# Patient Record
Sex: Male | Born: 1940 | Race: White | Hispanic: No | Marital: Married | Smoking: Never smoker
Health system: Southern US, Community
[De-identification: ages and names within clinical notes are randomized; demographics above are authoritative.]

## PROBLEM LIST (undated history)

## (undated) ENCOUNTER — Emergency Department (HOSPITAL_COMMUNITY): Payer: Medicare HMO

## (undated) DIAGNOSIS — I2510 Atherosclerotic heart disease of native coronary artery without angina pectoris: Secondary | ICD-10-CM

## (undated) DIAGNOSIS — E119 Type 2 diabetes mellitus without complications: Secondary | ICD-10-CM

## (undated) DIAGNOSIS — I480 Paroxysmal atrial fibrillation: Secondary | ICD-10-CM

## (undated) DIAGNOSIS — I350 Nonrheumatic aortic (valve) stenosis: Secondary | ICD-10-CM

## (undated) DIAGNOSIS — I10 Essential (primary) hypertension: Secondary | ICD-10-CM

---

## 1999-09-03 ENCOUNTER — Encounter: Payer: Self-pay | Admitting: Urology

## 1999-09-06 ENCOUNTER — Ambulatory Visit (HOSPITAL_COMMUNITY): Admission: RE | Admit: 1999-09-06 | Discharge: 1999-09-06 | Payer: Self-pay | Admitting: Urology

## 2005-07-04 ENCOUNTER — Ambulatory Visit: Payer: Self-pay | Admitting: Internal Medicine

## 2005-07-23 ENCOUNTER — Ambulatory Visit: Payer: Self-pay | Admitting: Internal Medicine

## 2005-08-22 ENCOUNTER — Ambulatory Visit: Payer: Self-pay | Admitting: Internal Medicine

## 2005-09-22 ENCOUNTER — Ambulatory Visit: Payer: Self-pay | Admitting: Internal Medicine

## 2005-10-23 ENCOUNTER — Ambulatory Visit: Payer: Self-pay | Admitting: Internal Medicine

## 2005-11-20 ENCOUNTER — Ambulatory Visit: Payer: Self-pay | Admitting: Internal Medicine

## 2005-12-21 ENCOUNTER — Ambulatory Visit: Payer: Self-pay | Admitting: Internal Medicine

## 2009-03-08 ENCOUNTER — Ambulatory Visit: Payer: Self-pay | Admitting: Unknown Physician Specialty

## 2009-03-22 ENCOUNTER — Ambulatory Visit: Payer: Self-pay | Admitting: Unknown Physician Specialty

## 2009-04-22 ENCOUNTER — Ambulatory Visit: Payer: Self-pay | Admitting: Unknown Physician Specialty

## 2010-11-06 ENCOUNTER — Ambulatory Visit: Payer: Self-pay | Admitting: Cardiology

## 2010-11-21 ENCOUNTER — Encounter: Payer: Self-pay | Admitting: Cardiology

## 2010-12-22 ENCOUNTER — Encounter: Payer: Self-pay | Admitting: Cardiology

## 2011-01-21 ENCOUNTER — Encounter: Payer: Self-pay | Admitting: Cardiology

## 2012-01-29 ENCOUNTER — Observation Stay: Payer: Self-pay | Admitting: Internal Medicine

## 2012-01-29 LAB — URINALYSIS, COMPLETE
Bacteria: NONE SEEN
Bilirubin,UR: NEGATIVE
Blood: NEGATIVE
Glucose,UR: 50 mg/dL (ref 0–75)
Ketone: NEGATIVE
Leukocyte Esterase: NEGATIVE
Nitrite: NEGATIVE
Ph: 6 (ref 4.5–8.0)
Protein: NEGATIVE
RBC,UR: NONE SEEN /HPF (ref 0–5)
Specific Gravity: 1.01 (ref 1.003–1.030)
Squamous Epithelial: NONE SEEN
WBC UR: NONE SEEN /HPF (ref 0–5)

## 2012-01-29 LAB — BASIC METABOLIC PANEL
Anion Gap: 10 (ref 7–16)
BUN: 13 mg/dL (ref 7–18)
Calcium, Total: 9.2 mg/dL (ref 8.5–10.1)
Chloride: 104 mmol/L (ref 98–107)
Co2: 25 mmol/L (ref 21–32)
Creatinine: 0.81 mg/dL (ref 0.60–1.30)
EGFR (African American): >60
EGFR (Non-African Amer.): >60
Glucose: 209 mg/dL — ABNORMAL HIGH (ref 65–99)
Osmolality: 284 (ref 275–301)
Potassium: 4.7 mmol/L (ref 3.5–5.1)
Sodium: 139 mmol/L (ref 136–145)

## 2012-01-29 LAB — CBC
HCT: 46 % (ref 40.0–52.0)
HGB: 15.1 g/dL (ref 13.0–18.0)
MCH: 28.6 pg (ref 26.0–34.0)
MCHC: 32.8 g/dL (ref 32.0–36.0)
MCV: 87 fL (ref 80–100)
Platelet: 201 10*3/uL (ref 150–440)
RBC: 5.27 10*6/uL (ref 4.40–5.90)
RDW: 14.5 % (ref 11.5–14.5)
WBC: 15.4 10*3/uL — ABNORMAL HIGH (ref 3.8–10.6)

## 2012-01-29 LAB — CK TOTAL AND CKMB (NOT AT ARMC)
CK, Total: 93 U/L (ref 35–232)
CK-MB: 0.7 ng/mL (ref 0.5–3.6)

## 2012-01-29 LAB — TROPONIN I
Troponin-I: <0.02 ng/mL
Troponin-I: 0.03 ng/mL

## 2012-01-29 LAB — WBC: WBC: 10.8 10*3/uL — ABNORMAL HIGH (ref 3.8–10.6)

## 2012-01-29 IMAGING — CR DG CHEST 1V PORT
1 series · 1 of 1 positions shown · non-contrast
Comparison: none

REASON FOR EXAM: CP
COMMENTS:

[portable]
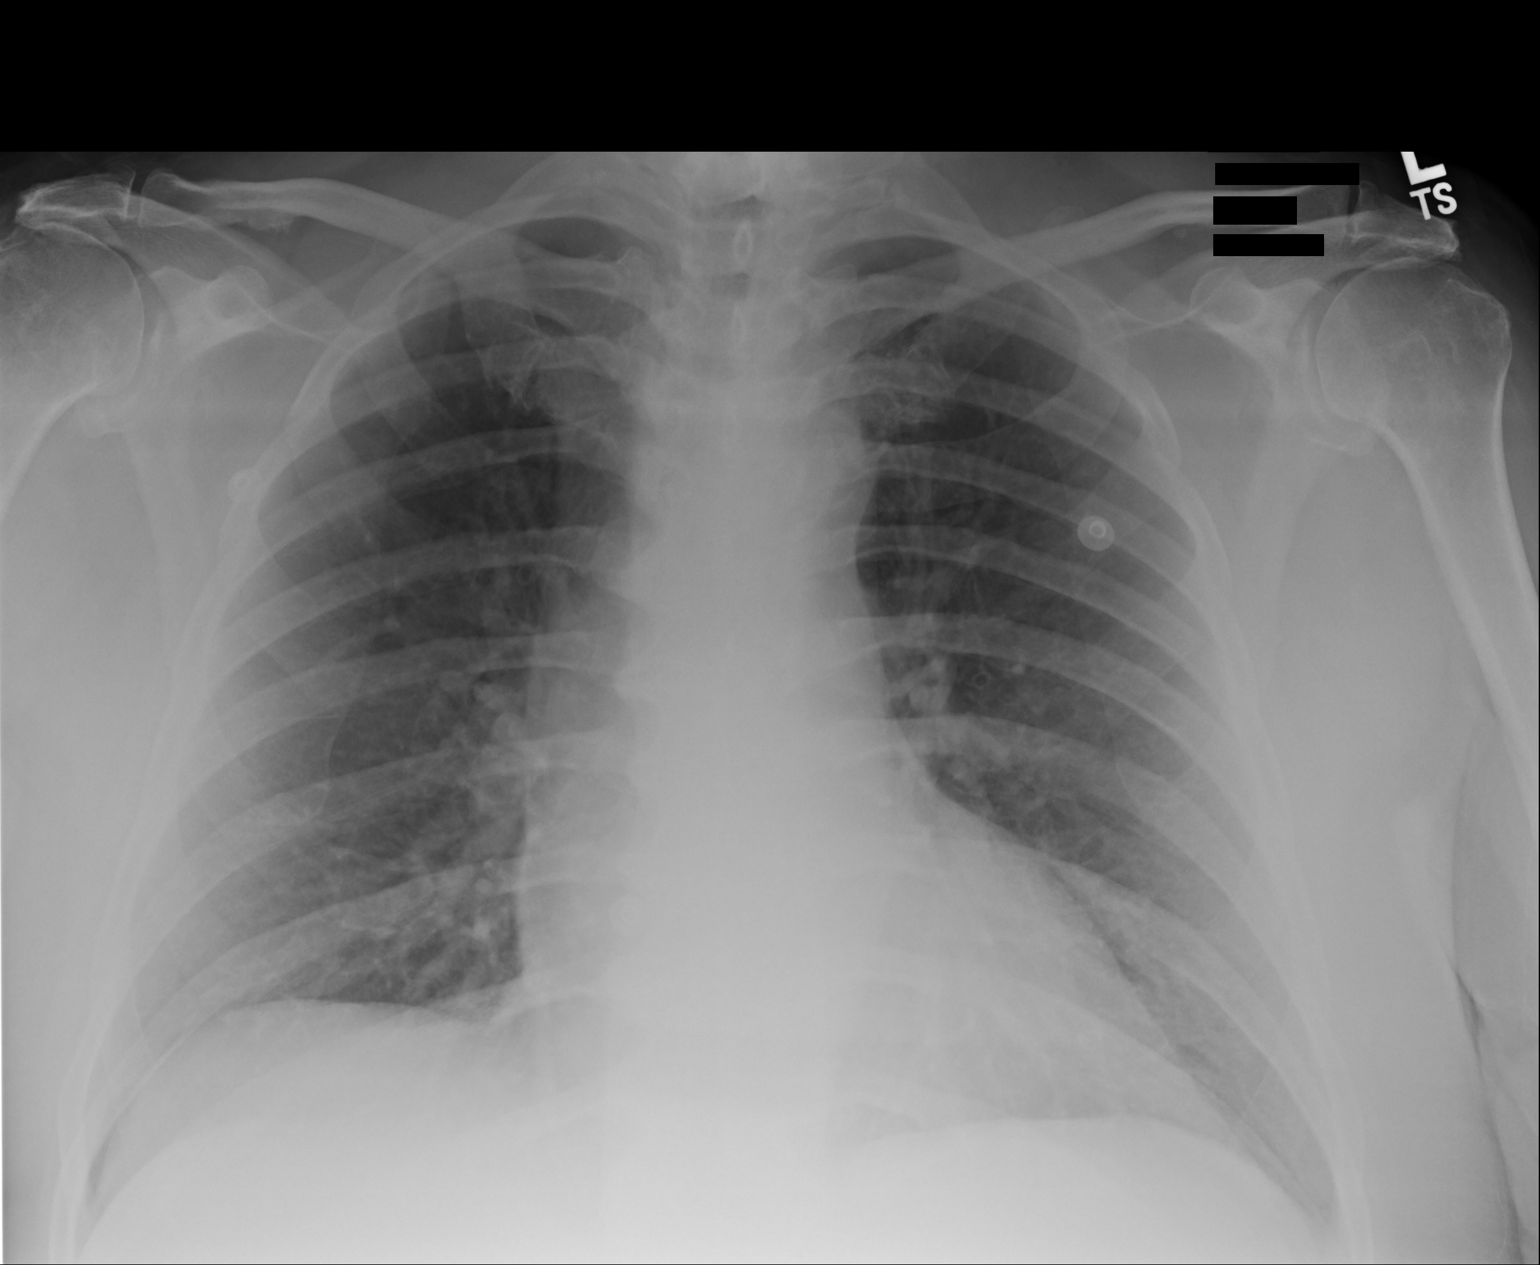

[1 of 1 positions shown; findings below may reference images not displayed]

PROCEDURE:     DXR - DXR PORTABLE CHEST SINGLE VIEW  - [DATE]  [DATE]

RESULT:     There is no previous exam for comparison.

The lungs are clear. The heart and pulmonary vessels are normal. The bony
and mediastinal structures are unremarkable. There is no effusion. There is
no pneumothorax or evidence of congestive failure.
IMPRESSION: No acute cardiopulmonary disease.

[REDACTED]

## 2012-05-01 DIAGNOSIS — Z961 Presence of intraocular lens: Secondary | ICD-10-CM | POA: Insufficient documentation

## 2012-05-01 DIAGNOSIS — E119 Type 2 diabetes mellitus without complications: Secondary | ICD-10-CM | POA: Insufficient documentation

## 2014-02-08 ENCOUNTER — Other Ambulatory Visit: Payer: Self-pay | Admitting: Gastroenterology

## 2014-03-06 DIAGNOSIS — Z800 Family history of malignant neoplasm of digestive organs: Secondary | ICD-10-CM | POA: Insufficient documentation

## 2014-04-14 ENCOUNTER — Ambulatory Visit: Payer: Self-pay | Admitting: Gastroenterology

## 2014-04-17 LAB — PATHOLOGY REPORT

## 2014-04-24 DIAGNOSIS — E669 Obesity, unspecified: Secondary | ICD-10-CM | POA: Insufficient documentation

## 2014-04-24 DIAGNOSIS — E538 Deficiency of other specified B group vitamins: Secondary | ICD-10-CM | POA: Insufficient documentation

## 2014-04-24 DIAGNOSIS — E781 Pure hyperglyceridemia: Secondary | ICD-10-CM | POA: Insufficient documentation

## 2014-06-13 DIAGNOSIS — N529 Male erectile dysfunction, unspecified: Secondary | ICD-10-CM | POA: Insufficient documentation

## 2014-06-13 DIAGNOSIS — I10 Essential (primary) hypertension: Secondary | ICD-10-CM | POA: Insufficient documentation

## 2014-06-13 DIAGNOSIS — I2510 Atherosclerotic heart disease of native coronary artery without angina pectoris: Secondary | ICD-10-CM | POA: Insufficient documentation

## 2015-01-03 ENCOUNTER — Ambulatory Visit: Admit: 2015-01-03 | Disposition: A | Discharge status: Home / Self Care | Payer: Self-pay | Admitting: Family Medicine

## 2015-01-14 NOTE — Consult Note (Signed)
    General Aspect This is a 74 year old male with history of diabetes mellitus, CAD, that presented  to the ER during the night with chest pain. he states he went to bed in his usual state of health and woke up with anterior chest pain.  This was not associated with shortness of breath or nausea, sweating.  He took 2 nitroglycerin and by the time he would was seen in the ER, he was pain free.  His cardiac enzymes have been normal.  His chest x-ray appeared normal.  He does have a history of having a cardiac catheterization in February of 2012.  At that time he had disease of the right coronary, including 70% proximal, 99% distal.  He did receive a stent to the distal RCA.  He has not had any pain since.  He had a stent placed.  He has  indigestion very infrequently, but does not believe this was indigestion.  However, it did not feel exactly like his previous pain at time of stent placement. He continues to be pain free. He has a pending results of lexiscanMyoview. he did present with sinus tachycardia at 150 bpm, but now is in normal sinus rhythm.   Physical Exam:   GEN well developed, no acute distress    HEENT pink conjunctivae, hearing intact to voice, good dentition    NECK supple    RESP normal resp effort    CARD Regular rate and rhythm    ABD denies tenderness  normal BS    EXTR negative edema    SKIN normal to palpation, skin turgor good    NEURO cranial nerves intact, motor/sensory function intact    PSYCH alert, A+O to time, place, person   Review of Systems:   Subjective/Chief Complaint Chest pain    General: No Complaints    Skin: No Complaints    ENT: No Complaints    Eyes: No Complaints    Neck: No Complaints    Respiratory: No Complaints    Cardiovascular: Chest pain or discomfort    Gastrointestinal: No Complaints    Genitourinary: No Complaints    Vascular: No Complaints    Musculoskeletal: No Complaints    Neurologic: No Complaints     Hematologic: No Complaints    Endocrine: No Complaints    Psychiatric: No Complaints    Medications/Allergies Reviewed Medications/Allergies reviewed   Blood Glucose:  09-May-13 07:38    POCT Blood Glucose 176    Actos: Muscles aches  Lipitor: Muscles aches  Citalopram: GI Distress  Wellbutrin: Dizzy/Fainting    Impression 74 year old male with known CAD, status post stenting of the distal RCA in February of 2012, remaining on aspirin and Plavix with onset of chest pain during the night, relieved with 2 nitroglycerin sublingually and is now pain free.  Patient also presented with sinus tachycardia, which is resolved and leukocytosis, questionable etiology.  Cardiac enzymes have been negative and results of Myoview are pending    Plan 1.  Continue aspirin and Plavix without change. 2.  Continue beta blocker. 3.  Continue investigation for leukocytosis. 4.  Continue monitoring of diabetes mellitus. 5.  Results of Myoview are pending.  Further  recommendations by Dr. Gwen PoundsKowalski  once results are known.   Electronic Signatures: Rudi Cocoarroll, Donna (NP)  (Signed 09-May-13 11:25)  Authored: General Aspect/Present Illness, History and Physical Exam, Review of System, Labs, Allergies, Impression/Plan   Last Updated: 09-May-13 11:25 by Rudi Cocoarroll, Donna (NP)

## 2015-01-14 NOTE — Discharge Summary (Signed)
PATIENT NAME:  Harry GrovesULLIAM, Harry [name] MR#:  [id number] DATE OF BIRTH:  Jun 27, 1941  DATE OF ADMISSION:  01/29/2012 DATE OF DISCHARGE:  01/29/2012  PRIMARY CARE PHYSICIAN: Alonna BucklerAndrew Lamb, MD   PRIMARY CARDIOLOGIST: Dr. Darrold JunkerParaschos.   REASON FOR ADMISSION: Chest pain.   DISCHARGE DIAGNOSES:  1. Chest pain of unclear etiology with negative cardiac enzymes, negative Myoview. 2. History of coronary artery disease status post drug-eluting stent to RCA February 2012. 3. Leukocytosis, felt to be stress induced.  4. History of type II diabetes mellitus.   CONSULT: Cardiology, Dr. Gwen PoundsKowalski    DISCHARGE DISPOSITION: Home.  DISCHARGE MEDICATIONS:  1. Aspirin 81 mg daily.  2. Plavix 75 mg daily.  3. Toprol-XL 25 mg p.o. daily. 4. Lisinopril 5 mg daily. 5. Vitamin B12 once [name] month.  6. Glimepiride 4 mg b.i.d.  7. Metformin 1000 mg b.i.d.  8. Nitrostat 0.4 mg sublingually q.5 minutes x3 p.r.n. chest pain.  9. Levemir 55 units subcutaneously at bedtime. 10. Tylenol 325 mg 1 to 2 tablets p.o. q.4 to 6 hours p.r.n. pain.   DISCHARGE CONDITION: Improved, stable.   DISCHARGE ACTIVITY: As tolerated.   DISCHARGE DIET: Low sodium, ADA, low fat, low cholesterol.   DISCHARGE INSTRUCTIONS:  1. Take medications as prescribed.  2. Return to the Emergency Department for recurrence of symptoms. 3. Do not take Byetta until further advised to do so by your endocrinologist (Dr. Renae FicklePaul) as there is [name] concern that Byetta might be related to thyroid cancer and we have adjusted/increased your Levemir dose to compensate since you are not being discharged on Byetta.   FOLLOW-UP INSTRUCTIONS: Follow-up with Dr. Randa LynnLamb, Dr. Darrold JunkerParaschos, and Dr. Renae FicklePaul within one week.   PROCEDURES:  1. Portable chest x-ray 01/29/2012 no acute cardiopulmonary abnormalities are noted.  2. Lexiscan/Myoview 01/29/2012 normal Lexiscan infusion EKG without evidence of ischemia with some T wave inversion chronically. There is normal LV systolic  function, EF 64%, minimal fixed inferoapical and apical myocardial perfusion defect consistent with previous infarct and/or apical thinning without evidence of myocardial ischemia.   PERTINENT LABORATORY DATA: Cardiac enzymes are negative with normal CK, CK-MB, and troponins. CBC normal on admission except for WBC 15.4; WBC 10.8 at the time of discharge. Urinalysis was benign. BMP normal on admission except for serum glucose of 209. D-dimer was 0.33.  BRIEF HISTORY/HOSPITAL COURSE: The patient is [name] pleasant 74 year old male with past medical history of type II diabetes mellitus and coronary artery disease status post drug-eluting stent to RCA February 2012 who presented to the Emergency Department with complaints of chest pain. Please see dictated admission history and physical for pertinent details surrounding the onset of this hospitalization. Please see below for further details.  1. Chest pain of unclear etiology with initial EKG with nonspecific ST and T wave changes. He was noted to be tachycardic for which he was maintained on beta-blocker for heart rate control and with these measures his heart rate had eventually normalized. [name] set of cardiac enzymes was checked in the ER and was negative. Thereafter, the patient was admitted to the hospital for further evaluation and management. The patient was maintained on oxygen, aspirin, Plavix, beta-blocker, ACE inhibitor, and p.r.n. nitrate therapy and eventually the patient became chest pain free. He had ruled out for MI with negative troponins and thereafter was sent for Myoview which was negative for reversible ischemia. Dr. Gwen PoundsKowalski of Cardiology was consulted and recommended continued medical management of the patient's coronary artery disease with aspirin, Plavix, beta-blocker, and ACE inhibitor  therapy and recommended that patient follow-up closely with his primary cardiologist, Dr. Darrold Junker, as an outpatient. The exact etiology of the patient's chest  pain is unclear at this time. PE was felt to be less likely given normal D-dimer. There was [name] concern of possible gastroesophageal reflux disease related chest pain, however, the patient stated that he experienced acid reflux very infrequently and would not keep him on [name] scheduled PPI as he is on Plavix and there is some concern that certain PPIs may lead to Plavix inactivation or reduced effectiveness of Plavix and advised the patient to use Zantac or Pepcid as needed which he can obtain over-the-counter. Otherwise, he will continue medical management of his CAD and Cardiology may also consider starting him on [name] low dose long-acting nitrate as an outpatient and the patient will follow-up with Dr. Darrold Junker in this regard. Otherwise, no further cardiac diagnostics are recommended at this time per Dr. Gwen Pounds. The patient is chest pain free at the time of discharge at rest and also with ambulation.  2. Type II diabetes mellitus which is insulin requiring. Advised the patient to stop using Byetta until he follows up with his endocrinologist as there may be an associated risk of thyroid cancer with Byetta and to compensate for discontinuing Byetta we have increased the dose of his Levemir and he will also continue Amaryl and metformin as before and follow-up with his endocrinologist closely as an outpatient.  3. Leukocytosis, felt to be stress induced. The patient has remained afebrile. There was no obvious source of infection. Chest x-ray was benign and urinalysis was also not indicative of UTI. WBC count has improved and almost normalized. This can be followed as an outpatient.   On 01/29/2012 at the time of discharge the patient was chest pain free at rest and also with ambulation and was without any specific complaints and was hemodynamically stable and felt to be stable for discharge home with close outpatient follow-up to which the patient was agreeable.   TIME SPENT ON DISCHARGE: Greater than 30 minutes.    ____________________________ Harry Alas, MD knl:drc D: 02/02/2012 15:49:36 ET T: 02/03/2012 09:24:15 ET JOB#: [id number]  cc: Harry Alas, MD, <Dictator> Reola Mosher. Randa Lynn, MD Marcina Millard, MD Bhakti B. Renae Fickle, MD Harry Alas MD ELECTRONICALLY SIGNED 02/12/2012 15:04

## 2015-01-14 NOTE — H&P (Signed)
PATIENT NAME:  Harry [name], Harry [name] MR#:  [id number] DATE OF BIRTH:  [birth year]  DATE OF ADMISSION:  01/29/2012  PRIMARY CARE PHYSICIAN: Alonna BucklerAndrew Lamb, MD   CARDIOLOGIST: Marcina MillardAlexander Paraschos, MD   CHIEF COMPLAINT: Chest pain.   HISTORY OF PRESENT ILLNESS: Harry [name] is [name] 74 year old pleasant Caucasian male with history of coronary artery disease status post stent implant to the distal RCA in February 2012 and history of diabetes mellitus. The patient was in his usual state of health all this time and he was chest pain free until tonight when he woke up from sleep with chest pain that started about three hours ago. The location of the pain was across the chest from the right to the left. The severity was 3 to 4 on [name] scale of 10. It lasted about 30 minutes and then subsided after taking two sublingual nitroglycerin. He had no shortness of breath. No nausea. No vomiting. No sweating. No syncope. No palpitations. At that time he decided to come to the Emergency Department for evaluation after pressure from his wife.   REVIEW OF SYSTEMS: CONSTITUTIONAL: No fever. No chills. No night sweats. No fatigue. EYES: No blurring of vision. No double vision. ENT: No hearing impairment. No sore throat. No dysphagia. CARDIOVASCULAR: Reports the chest pain as above. No shortness of breath. No edema. No syncope. RESPIRATORY: No cough. No sputum production. No shortness of breath. GASTROINTESTINAL: No abdominal pain. No nausea. No vomiting. No diarrhea. GENITOURINARY: No dysuria. No frequency of urination. MUSCULOSKELETAL: No joint pain or swelling. No muscular pain or swelling. INTEGUMENTARY: No skin rash. No ulcers. NEUROLOGY: No focal weakness. No seizure activity. No headache. PSYCHIATRY: No anxiety. No depression. ENDOCRINE: No polyuria or polydipsia. No heat or cold intolerance.   PAST MEDICAL HISTORY:  1. Coronary artery disease. He had cardiac cath in February of 2012 that revealed occluded distal LAD, occluded mid  left circumflex, occluded first diagonal branch, 70% stenosis in the proximal right coronary artery and high-grade 99% stenosis in the distal segment. The patient underwent percutaneous coronary intervention receiving drug-eluting stent in the distal right coronary artery with excellent angiographic results. 2. Diabetes mellitus type 2, on insulin.   PAST SURGICAL HISTORY:  1. Cataract surgery. 2. Cardiac stent implant.  3. History of appendectomy.   SOCIAL HABITS: Nonsmoker. No history of alcohol or drug abuse.   SOCIAL HISTORY: He is retired from working in [employer] business. He is married living with his wife.   FAMILY HISTORY: He has two siblings. [name] brother and sister both have diabetes. His father had intestinal cancer. He suffered from coronary artery disease and he had coronary artery bypass graft at the age of 74. His mother suffered from leukemia.   ADMISSION MEDICATIONS:  1. Levemir 50 units at night.  2. Byetta 10 units twice [name] day. 3. Metformin 1000 mg twice [name] day.  4. Glimepiride or Amaryl 4 mg twice [name] day. 5. Metoprolol succinate 25 mg once [name] day. 6. Plavix 75 mg [name] day. 7. Aspirin 81 mg [name] day. 8. Lisinopril 5 mg [name] day. 9. Sublingual nitroglycerin p.r.n.  10. Fish Oil.   ALLERGIES: No known drug allergies. However, he has some side effects from Wellbutrin, Lipitor, Actos, and citalopram but these are not true allergies.   PHYSICAL EXAMINATION:   VITAL SIGNS: Blood pressure 137/82, respiratory rate 16, pulse 107, temperature 98.4, oxygen saturation 95%.   HEAD: No pallor. No icterus. No cyanosis.   EARS, NOSE, AND THROAT: Hearing was normal. Nasal mucosa,  lips, tongue were normal.   EYES: Normal eyelids and conjunctivae. Pupils about 4 mm, equal, sluggishly reactive to light.   NECK: Supple. Trachea at midline. No thyromegaly. No cervical lymphadenopathy. No masses.   HEART: Normal S1, S2. No S3, S4. No murmur. No gallop. No carotid bruits.   RESPIRATORY: Normal  breathing pattern without use of accessory muscles. No rales. No wheezing.   ABDOMEN: Soft without tenderness. No hepatosplenomegaly. No masses. No hernias.   SKIN: No ulcers. No subcutaneous nodules.   MUSCULOSKELETAL: No joint swelling. No clubbing.   NEUROLOGIC: Cranial nerves II through XII are intact. No focal motor deficits.   PSYCHIATRIC: The patient is alert and oriented x3. Mood and affect were normal.   LABORATORY, DIAGNOSTIC, AND RADIOLOGICAL DATA: His initial EKG showed sinus tachycardia at rate of 150 per minute. Unremarkable EKG.  Glucose 209, BUN 13, creatinine 0.8, sodium 139, potassium 4.7. Total CPK 93. Troponin 0.02. CBC showed white count 15,000, hemoglobin 15, hematocrit 46, platelet count 201. D-dimer was 0.3.   Chest x-ray showed heart size at upper normal. No consolidation. No effusion. No pneumothorax.   ASSESSMENT:  1. Chest pain with prior documented coronary artery disease as above. His chest pain is located at the same area where he had previous angina. This is suspicious for angina.  2. Documented coronary artery disease with cardiac cath in February 2012 with details as above. He underwent stent implant for distal right coronary artery.  3. Diabetes mellitus type 2, on insulin.  4. Leukocytosis. Etiology is unclear.    PLAN:  1. Will admit to the telemetry unit.  2. Follow-up on cardiac enzymes.  3. Continue antiplatelet using Plavix and aspirin.  4. Continue beta-blocker using metoprolol. The heart rate is down now to about 100, earlier was 150's, sinus tachycardia. The patient is chest pain free.  5. I will put him on Lovenox for deep vein thrombosis prophylaxis.   6. Schedule him for nuclear stress test.  7. Cardiology consultation.  8. Place him on Accu-Cheks and sliding scale.  9. Continue metformin and Amaryl in addition to Levemir.   I spoke with the patient regarding LIVING WILL. He confirms that he has [name] LIVING WILL and he also appointed as  power-of-attorney his daughter, Darel Hong.   TIME SPENT EVALUATING THIS PATIENT: More than 55 minutes.    ____________________________ Carney Corners. Rudene Re, MD amd:drc D: 01/29/2012 03:30:16 ET T: 01/29/2012 09:15:36 ET JOB#: [id number]  cc: Carney Corners. Rudene Re, MD, <Dictator> Reola Mosher. Randa Lynn, MD Zollie Scale MD ELECTRONICALLY SIGNED 02/06/2012 2:51

## 2016-09-03 ENCOUNTER — Emergency Department: Payer: Medicare HMO

## 2016-09-03 ENCOUNTER — Emergency Department
Admission: EM | Admit: 2016-09-03 | Discharge: 2016-09-03 | Disposition: A | Discharge status: Home / Self Care | Payer: Medicare HMO | Attending: Emergency Medicine | Admitting: Emergency Medicine

## 2016-09-03 ENCOUNTER — Encounter: Payer: Self-pay | Admitting: Emergency Medicine

## 2016-09-03 DIAGNOSIS — J9801 Acute bronchospasm: Secondary | ICD-10-CM | POA: Diagnosis not present

## 2016-09-03 DIAGNOSIS — J40 Bronchitis, not specified as acute or chronic: Secondary | ICD-10-CM

## 2016-09-03 DIAGNOSIS — R079 Chest pain, unspecified: Secondary | ICD-10-CM | POA: Diagnosis present

## 2016-09-03 LAB — BASIC METABOLIC PANEL
Anion gap: 10 (ref 5–15)
BUN: 20 mg/dL (ref 6–20)
CO2: 23 mmol/L (ref 22–32)
Calcium: 9.2 mg/dL (ref 8.9–10.3)
Chloride: 101 mmol/L (ref 101–111)
Creatinine, Ser: 0.85 mg/dL (ref 0.61–1.24)
GFR calc Af Amer: >60 mL/min (ref >60)
GFR calc non Af Amer: >60 mL/min (ref >60)
GLUCOSE: 167 mg/dL — AB (ref 65–99)
POTASSIUM: 4.9 mmol/L (ref 3.5–5.1)
Sodium: 134 mmol/L — ABNORMAL LOW (ref 135–145)

## 2016-09-03 LAB — TROPONIN I: Troponin I: <0.03 ng/mL (ref <0.03)

## 2016-09-03 LAB — CBC
HEMATOCRIT: 39.6 % — AB (ref 40.0–52.0)
Hemoglobin: 13.1 g/dL (ref 13.0–18.0)
MCH: 28.1 pg (ref 26.0–34.0)
MCHC: 33.1 g/dL (ref 32.0–36.0)
MCV: 85.1 fL (ref 80.0–100.0)
Platelets: 240 10*3/uL (ref 150–440)
RBC: 4.65 MIL/uL (ref 4.40–5.90)
RDW: 15.3 % — AB (ref 11.5–14.5)
WBC: 10.8 10*3/uL — ABNORMAL HIGH (ref 3.8–10.6)

## 2016-09-03 IMAGING — CR DG CHEST 2V
1 series · 2 of 2 positions shown · non-contrast
Comparison: [DATE]

CLINICAL DATA: Cough for 2 weeks

EXAM:
CHEST  2 VIEW

[Series 1: dg chest 2 view · 0.14mm/px · 2 of 2 slices shown]
[im 1/2]
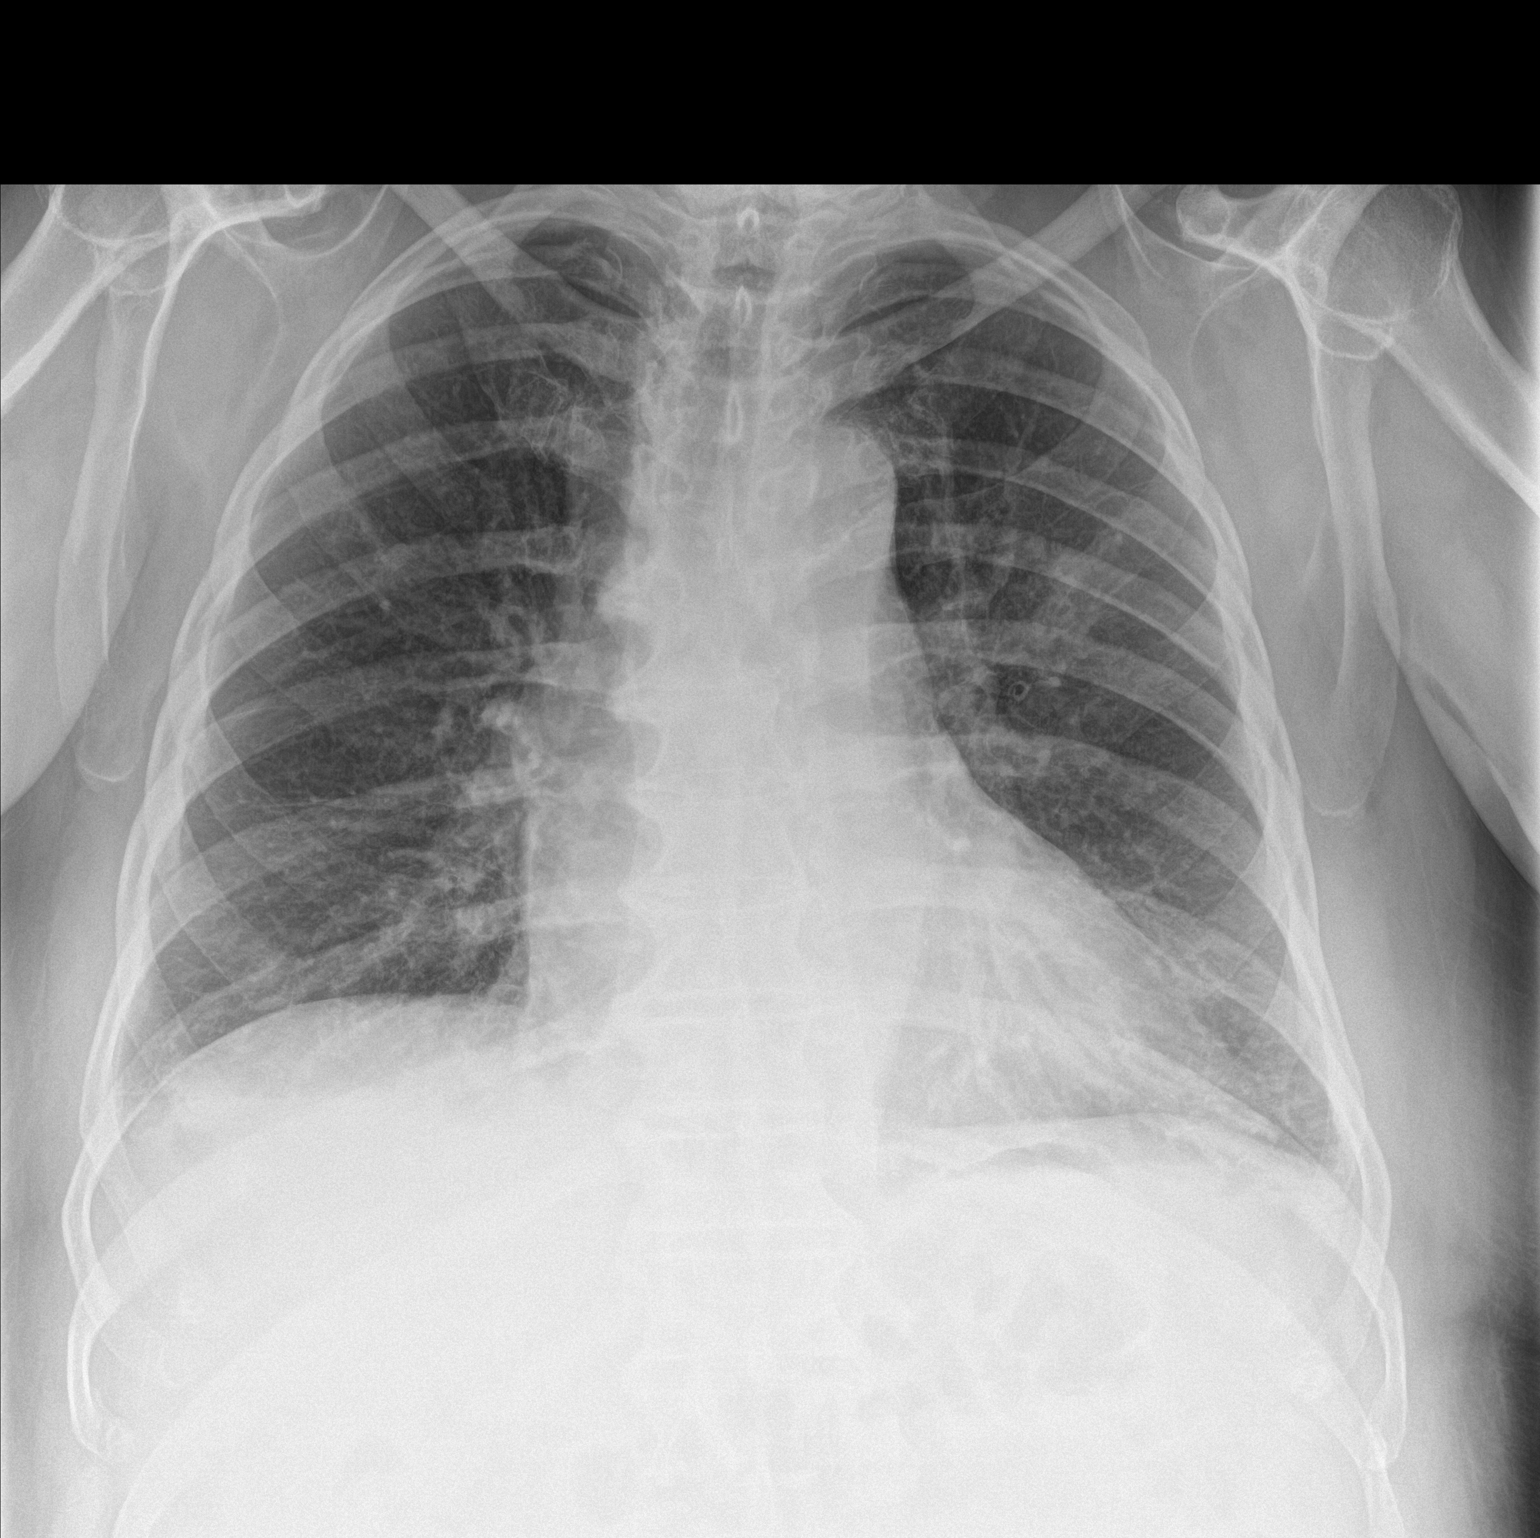
[im 2/2]
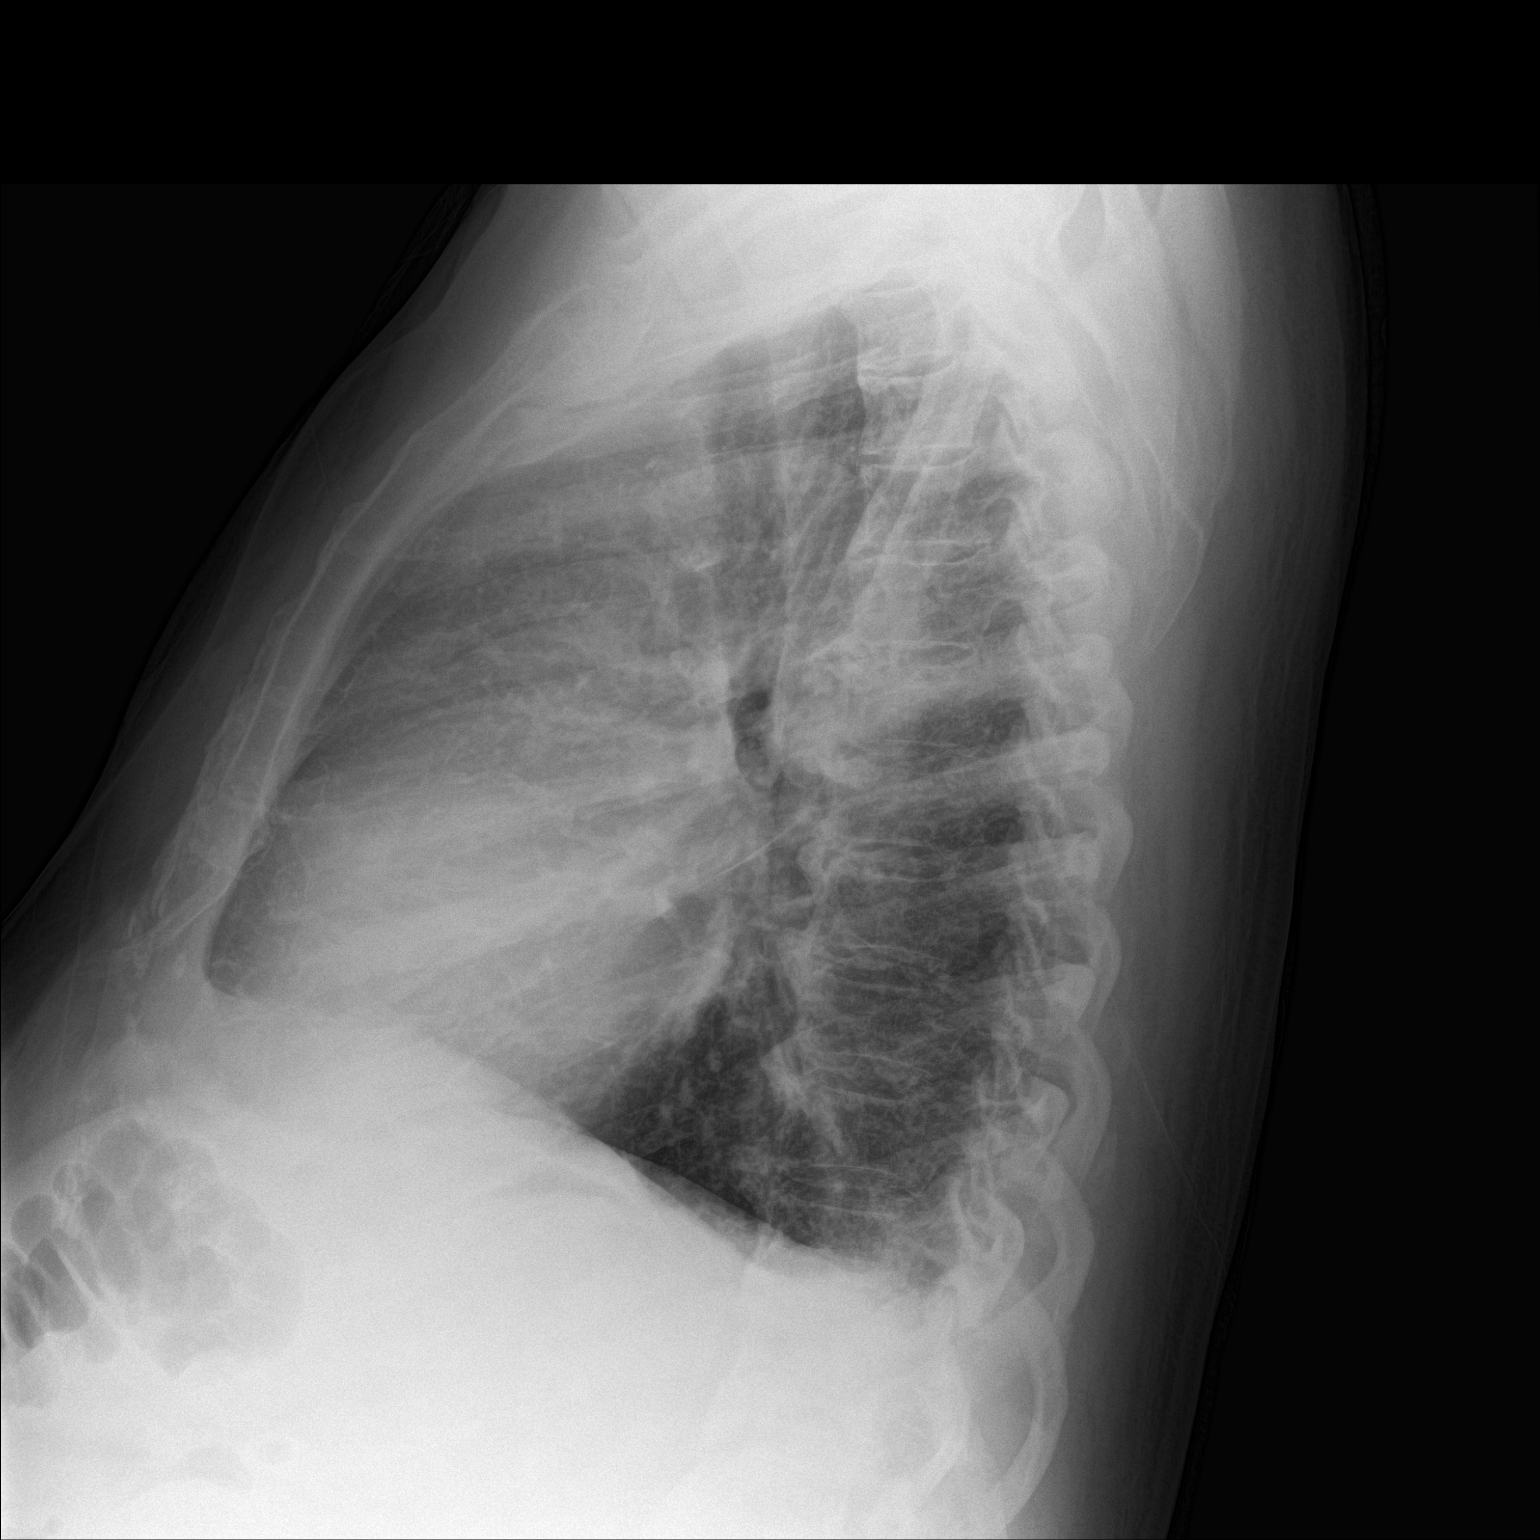

[2 of 2 positions shown; findings below may reference images not displayed]

FINDINGS: Tiny bilateral effusions, best seen on the lateral image. No focal
consolidation or effusion. Stable cardiomediastinal silhouette. No
pneumothorax. Degenerative changes of the bilateral shoulders with
probable rotator cuff disease on the right.
IMPRESSION: Tiny bilateral pleural effusions.  No focal consolidation.

## 2016-09-03 MED ORDER — ALBUTEROL SULFATE (2.5 MG/3ML) 0.083% IN NEBU
2.5000 mg | INHALATION_SOLUTION | Freq: Once | RESPIRATORY_TRACT | Status: AC
  Administered 2016-09-03: 2.5 mg via RESPIRATORY_TRACT
  Filled 2016-09-03: qty 3

## 2016-09-03 MED ORDER — ALBUTEROL SULFATE HFA 108 (90 BASE) MCG/ACT IN AERS: 2.0000 | INHALATION_SPRAY | Freq: Four times a day (QID) | RESPIRATORY_TRACT | 0 refills | Status: DC | PRN

## 2016-09-03 MED ORDER — PREDNISONE 20 MG PO TABS: 40.0000 mg | ORAL_TABLET | Freq: Every day | ORAL | 0 refills | Status: AC

## 2016-09-03 MED ORDER — PREDNISONE 20 MG PO TABS
40.0000 mg | ORAL_TABLET | Freq: Once | ORAL | Status: AC
  Administered 2016-09-03: 40 mg via ORAL
  Filled 2016-09-03: qty 2

## 2016-09-03 NOTE — ED Triage Notes (Signed)
Pt presents c/o chest pain x several weeks; states he was discovered to have high/irregular hb at endocrinologist's office and stated that he has been put on medication to slow HR and "wants to see if it's working." Pt alert & oriented with NAD noted.

## 2016-09-03 NOTE — ED Notes (Signed)
Update: pt does not c/o chest pain as prior listed. He states that he was seen at urgent care on Monday and had "a touch of pneumonia" and was given antibiotics and cough medication, but he doesn't feel better and his chest hurts when he coughs. Pt alert & oriented. NAD noted.

## 2016-09-03 NOTE — ED Provider Notes (Signed)
[facility] Emergency Department Provider Note  ____________________________________________   First MD Initiated Contact with Patient 09/03/16 1725     (approximate)  I have reviewed the triage vital signs and the nursing notes.   HISTORY  Chief Complaint Chest Pain   HPI Harry GrovesRichard A Kerstetter is a 75 y.o. male with a history of atrial flutter on eliquis as well as recently placed on Cardizem was presenting to the emergency department for a heart rate checked. He says that he has had trouble over the past several weeks getting his heart rate normalized. He says it has been up in the 130s and 140s. He saw his cardiologist who started him on Cardizem. The patient is denying any chest pain or shortness of breath. However, he says that he has been coughing which is productive of clear to yellow sputum and that he has been recently placed on a cough syrup as well as doxycycline she started yesterday. He reports minimal improvement with these medications.   History reviewed. No pertinent past medical history.  There are no active problems to display for this patient.   History reviewed. No pertinent surgical history.  Prior to Admission medications   Not on File    Allergies Patient has no known allergies.  History reviewed. No pertinent family history.  Social History Social History  Substance Use Topics  . Smoking status: Never Smoker  . Smokeless tobacco: Never Used  . Alcohol use No    Review of Systems Constitutional: No fever/chills Eyes: No visual changes. ENT: No sore throat. Cardiovascular: Patient denies chest emesis his family is saying to the staff at the emergency department that he is had chest pain. Respiratory: As above Gastrointestinal: No abdominal pain.  No nausea, no vomiting.  No diarrhea.  No constipation. Genitourinary: Negative for dysuria. Musculoskeletal: Negative for back pain. Skin: Negative for rash. Neurological:  Negative for headaches, focal weakness or numbness.  10-point ROS otherwise negative.  ____________________________________________   PHYSICAL EXAM:  VITAL SIGNS: ED Triage Vitals  Enc Vitals Group     BP 09/03/16 1618 (!) 120/54     Pulse Rate 09/03/16 1731 (!) 53     Resp 09/03/16 1618 18     Temp 09/03/16 1618 98 F (36.7 C)     Temp Source 09/03/16 1618 Oral     SpO2 09/03/16 1618 92 %     Weight 09/03/16 1620 230 lb (104.3 kg)     Height 09/03/16 1620 5\' 7"  (1.702 m)     Head Circumference --      Peak Flow --      Pain Score --      Pain Loc --      Pain Edu? --      Excl. in GC? --     Constitutional: Alert and oriented. Well appearing and in no acute distress. Eyes: Conjunctivae are normal. PERRL. EOMI. Head: Atraumatic. Nose: No congestion/rhinnorhea. Mouth/Throat: Mucous membranes are moist.   Neck: No stridor.   Cardiovascular: Normal rate, regular rhythm. Grossly normal heart sounds.   Respiratory: Normal respiratory effort.  No retractions.Mild wheezing throughout. Patient with intermittent cough productive of clear to white sputum. Gastrointestinal: Soft and nontender. No distention. No abdominal bruits. No CVA tenderness. Musculoskeletal: No lower extremity tenderness nor edema.  No joint effusions. Neurologic:  Normal speech and language. No gross focal neurologic deficits are appreciated.  Skin:  Skin is warm, dry and intact. No rash noted. Psychiatric: Mood and affect are normal.  Speech and behavior are normal.  ____________________________________________   LABS (all labs ordered are listed, but only abnormal results are displayed)  Labs Reviewed  BASIC METABOLIC PANEL - Abnormal; Notable for the following:       Result Value   Sodium 134 (*)    Glucose, Bld 167 (*)    All other components within normal limits  CBC - Abnormal; Notable for the following:    WBC 10.8 (*)    HCT 39.6 (*)    RDW 15.3 (*)    All other components within normal  limits  TROPONIN I   ____________________________________________  EKG  ED ECG REPORT I, Arelia LongestSchaevitz,  Paxtyn Boyar M, the attending physician, personally viewed and interpreted this ECG.   Date: 09/03/2016  EKG Time: 1618  Rate:56  Rhythm: Sinus bradycardia with premature atrial complex.  Axis: Normal axis  Intervals:Prolonged QT of 476 ms.  ST&T Change: No ST segment elevation or depression. No abnormal T-wave inversion.  ____________________________________________  RADIOLOGY  DG Chest 2 View (Final result)  Result time 09/03/16 17:06:11  Final result by Adrian ProwsKim M Fujinaga, MD (09/03/16 17:06:11)           Narrative:   CLINICAL DATA: Cough for 2 weeks  EXAM: CHEST 2 VIEW  COMPARISON: 01/29/2012  FINDINGS: Tiny bilateral effusions, best seen on the lateral image. No focal consolidation or effusion. Stable cardiomediastinal silhouette. No pneumothorax. Degenerative changes of the bilateral shoulders with probable rotator cuff disease on the right.  IMPRESSION: Tiny bilateral pleural effusions. No focal consolidation.   Electronically Signed By: Jasmine PangKim Fujinaga M.D. On: 09/03/2016 17:06          ____________________________________________   PROCEDURES  Procedure(s) performed:   Procedures  Critical Care performed:   ____________________________________________   INITIAL IMPRESSION / ASSESSMENT AND PLAN / ED COURSE  Pertinent labs & imaging results that were available during my care of the patient were reviewed by me and considered in my medical decision making (see chart for details).    Clinical Course   ----------------------------------------- 7:18 PM on 09/03/2016 -----------------------------------------  Patient given albuterol treatment as well as 40 mg of prednisone. No longer wheezing. Decreased cough. Will be discharged home. To follow-up with his cardiologist. Likely bronchitis causing his cough and wheezing. We'll continue on the  doxycycline. He is understanding of the plan and willing to comply. Also discussed possible Rison sugar with the use of prednisone and diabetes. We'll continue to check his sugars at home. He is understanding of and willing to comply.   ____________________________________________   FINAL CLINICAL IMPRESSION(S) / ED DIAGNOSES  Bronchospasm.    NEW MEDICATIONS STARTED DURING THIS VISIT:  New Prescriptions   No medications on file     Note:  This document was prepared using Dragon voice recognition software and may include unintentional dictation errors.    Myrna Blazeravid Matthew Umair Rosiles, MD 09/03/16 [id number]

## 2017-03-05 DIAGNOSIS — G4733 Obstructive sleep apnea (adult) (pediatric): Secondary | ICD-10-CM | POA: Insufficient documentation

## 2017-03-05 DIAGNOSIS — I480 Paroxysmal atrial fibrillation: Secondary | ICD-10-CM | POA: Insufficient documentation

## 2017-07-06 DIAGNOSIS — R011 Cardiac murmur, unspecified: Secondary | ICD-10-CM | POA: Insufficient documentation

## 2017-12-07 DIAGNOSIS — E113513 Type 2 diabetes mellitus with proliferative diabetic retinopathy with macular edema, bilateral: Secondary | ICD-10-CM | POA: Insufficient documentation

## 2018-07-06 DIAGNOSIS — I350 Nonrheumatic aortic (valve) stenosis: Secondary | ICD-10-CM | POA: Insufficient documentation

## 2018-10-24 DIAGNOSIS — H35033 Hypertensive retinopathy, bilateral: Secondary | ICD-10-CM | POA: Insufficient documentation

## 2021-01-21 ENCOUNTER — Emergency Department
Admission: EM | Admit: 2021-01-21 | Discharge: 2021-01-21 | Disposition: A | Discharge status: Home / Self Care | Payer: Medicare HMO | Attending: Emergency Medicine | Admitting: Emergency Medicine

## 2021-01-21 ENCOUNTER — Emergency Department: Payer: Medicare HMO

## 2021-01-21 ENCOUNTER — Other Ambulatory Visit: Payer: Self-pay

## 2021-01-21 DIAGNOSIS — E860 Dehydration: Secondary | ICD-10-CM | POA: Insufficient documentation

## 2021-01-21 DIAGNOSIS — Z7984 Long term (current) use of oral hypoglycemic drugs: Secondary | ICD-10-CM | POA: Insufficient documentation

## 2021-01-21 DIAGNOSIS — IMO0002 Reserved for concepts with insufficient information to code with codable children: Secondary | ICD-10-CM | POA: Insufficient documentation

## 2021-01-21 DIAGNOSIS — I10 Essential (primary) hypertension: Secondary | ICD-10-CM | POA: Diagnosis not present

## 2021-01-21 DIAGNOSIS — Z79899 Other long term (current) drug therapy: Secondary | ICD-10-CM | POA: Insufficient documentation

## 2021-01-21 DIAGNOSIS — Z794 Long term (current) use of insulin: Secondary | ICD-10-CM | POA: Diagnosis not present

## 2021-01-21 DIAGNOSIS — E11319 Type 2 diabetes mellitus with unspecified diabetic retinopathy without macular edema: Secondary | ICD-10-CM | POA: Diagnosis not present

## 2021-01-21 DIAGNOSIS — R55 Syncope and collapse: Secondary | ICD-10-CM

## 2021-01-21 DIAGNOSIS — Z7901 Long term (current) use of anticoagulants: Secondary | ICD-10-CM | POA: Insufficient documentation

## 2021-01-21 DIAGNOSIS — R42 Dizziness and giddiness: Secondary | ICD-10-CM | POA: Diagnosis not present

## 2021-01-21 DIAGNOSIS — I4891 Unspecified atrial fibrillation: Secondary | ICD-10-CM | POA: Diagnosis not present

## 2021-01-21 DIAGNOSIS — D649 Anemia, unspecified: Secondary | ICD-10-CM | POA: Insufficient documentation

## 2021-01-21 DIAGNOSIS — N400 Enlarged prostate without lower urinary tract symptoms: Secondary | ICD-10-CM | POA: Insufficient documentation

## 2021-01-21 DIAGNOSIS — I2510 Atherosclerotic heart disease of native coronary artery without angina pectoris: Secondary | ICD-10-CM | POA: Insufficient documentation

## 2021-01-21 LAB — COMPREHENSIVE METABOLIC PANEL
ALT: 13 U/L (ref 0–44)
AST: 19 U/L (ref 15–41)
Albumin: 3.7 g/dL (ref 3.5–5.0)
Alkaline Phosphatase: 65 U/L (ref 38–126)
Anion gap: 12 (ref 5–15)
BUN: 21 mg/dL (ref 8–23)
CO2: 22 mmol/L (ref 22–32)
Calcium: 9.2 mg/dL (ref 8.9–10.3)
Chloride: 100 mmol/L (ref 98–111)
Creatinine, Ser: 1.01 mg/dL (ref 0.61–1.24)
GFR, Estimated: >60 mL/min (ref >60)
Glucose, Bld: 185 mg/dL — ABNORMAL HIGH (ref 70–99)
Potassium: 5.3 mmol/L — ABNORMAL HIGH (ref 3.5–5.1)
Sodium: 134 mmol/L — ABNORMAL LOW (ref 135–145)
Total Bilirubin: 0.7 mg/dL (ref 0.3–1.2)
Total Protein: 7.4 g/dL (ref 6.5–8.1)

## 2021-01-21 LAB — CBC WITH DIFFERENTIAL/PLATELET
Abs Immature Granulocytes: 0.05 10*3/uL (ref 0.00–0.07)
Basophils Absolute: 0.1 10*3/uL (ref 0.0–0.1)
Basophils Relative: 1 %
Eosinophils Absolute: 0.3 10*3/uL (ref 0.0–0.5)
Eosinophils Relative: 3 %
HCT: 37 % — ABNORMAL LOW (ref 39.0–52.0)
Hemoglobin: 11.6 g/dL — ABNORMAL LOW (ref 13.0–17.0)
Immature Granulocytes: 1 %
Lymphocytes Relative: 21 %
Lymphs Abs: 2.3 10*3/uL (ref 0.7–4.0)
MCH: 25.6 pg — ABNORMAL LOW (ref 26.0–34.0)
MCHC: 31.4 g/dL (ref 30.0–36.0)
MCV: 81.5 fL (ref 80.0–100.0)
Monocytes Absolute: 1.1 10*3/uL — ABNORMAL HIGH (ref 0.1–1.0)
Monocytes Relative: 10 %
Neutro Abs: 7.1 10*3/uL (ref 1.7–7.7)
Neutrophils Relative %: 64 %
Platelets: 295 10*3/uL (ref 150–400)
RBC: 4.54 MIL/uL (ref 4.22–5.81)
RDW: 17.6 % — ABNORMAL HIGH (ref 11.5–15.5)
WBC: 10.9 10*3/uL — ABNORMAL HIGH (ref 4.0–10.5)
nRBC: 0 % (ref 0.0–0.2)

## 2021-01-21 LAB — URINALYSIS, COMPLETE (UACMP) WITH MICROSCOPIC
Bacteria, UA: NONE SEEN
Bilirubin Urine: NEGATIVE
Glucose, UA: NEGATIVE mg/dL
Hgb urine dipstick: NEGATIVE
Ketones, ur: NEGATIVE mg/dL
Nitrite: NEGATIVE
Protein, ur: 100 mg/dL — AB
Specific Gravity, Urine: 1.011 (ref 1.005–1.030)
pH: 7 (ref 5.0–8.0)

## 2021-01-21 LAB — TROPONIN I (HIGH SENSITIVITY)
Troponin I (High Sensitivity): 11 ng/L (ref <18)
Troponin I (High Sensitivity): 12 ng/L (ref <18)

## 2021-01-21 IMAGING — DX DG CHEST 1V PORT
1 series · 1 of 1 positions shown · non-contrast
Comparison: [DATE]

CLINICAL DATA: Syncopal episode.

EXAM:
PORTABLE CHEST 1 VIEW

[chest ap]
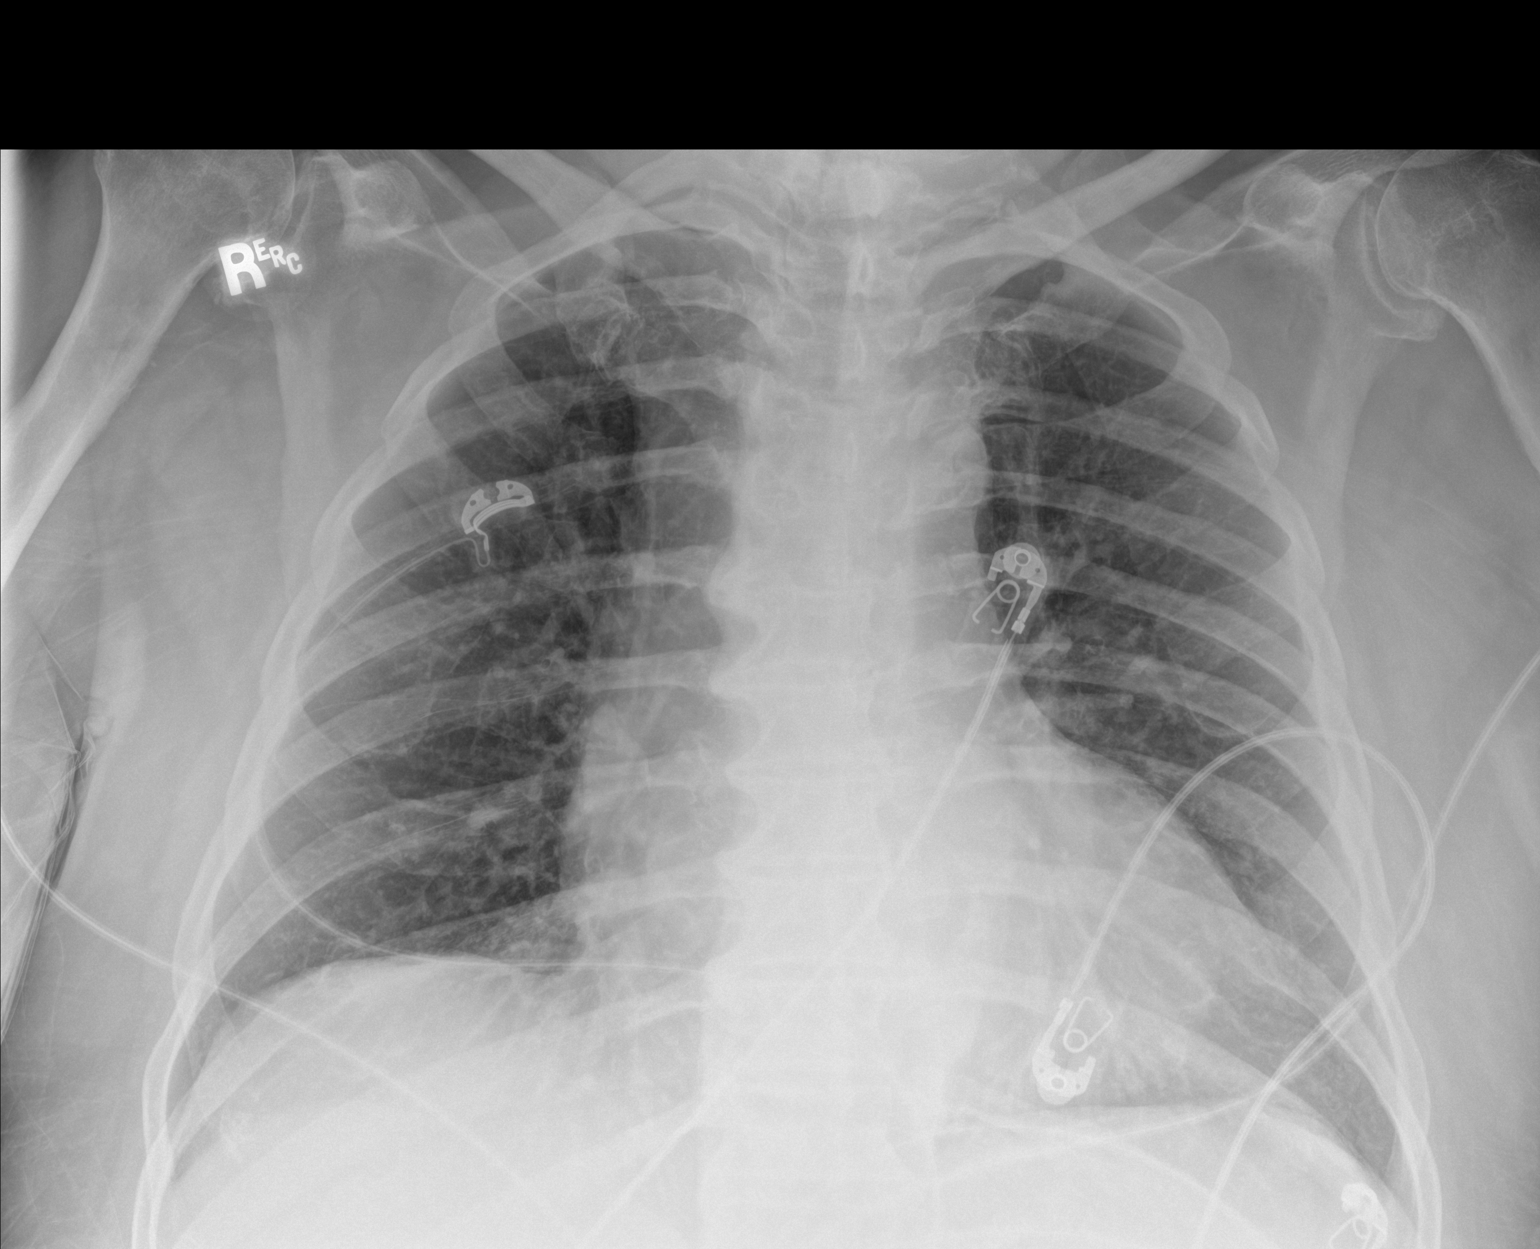

[1 of 1 positions shown; findings below may reference images not displayed]

FINDINGS: The cardiac silhouette is mildly enlarged. Both lungs are clear.
Degenerative changes seen throughout the thoracic spine.
IMPRESSION: No active disease.

## 2021-01-21 IMAGING — CT CT HEAD W/O CM
4 series · 15 of 47 positions shown, 17 images · non-contrast
Comparison: None.

CLINICAL DATA: Syncopal episode.

EXAM:
CT HEAD WITHOUT CONTRAST
TECHNIQUE: Contiguous axial images were obtained from the base of the skull
through the vertex without intravenous contrast.

[Series 2: ax head wo · axial · 0.41mm/px · z∈[-148,-10]mm · 7 of 38 slices shown, 9 images]
[im 5/38  brain]
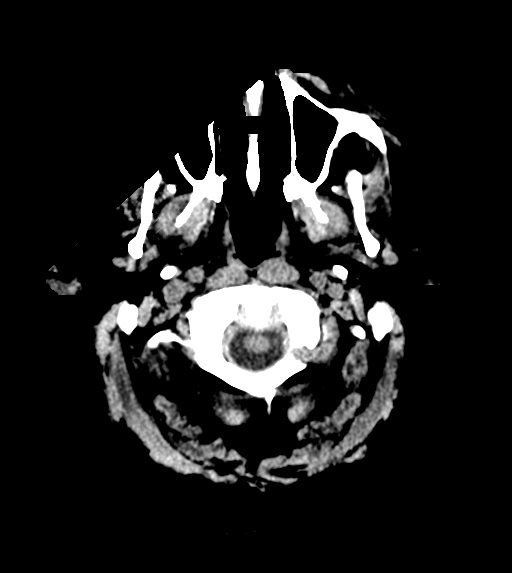
[im 5/38  bone]
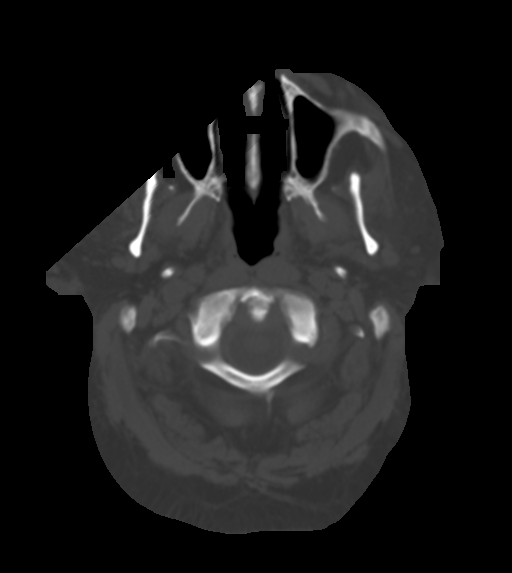
[im 10/38  brain]
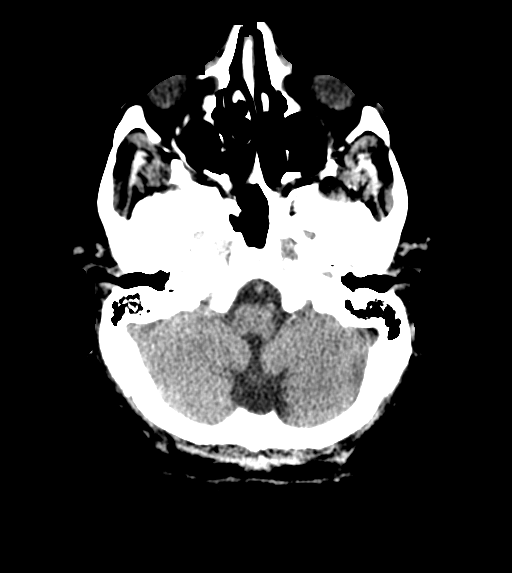
[im 14/38  brain]
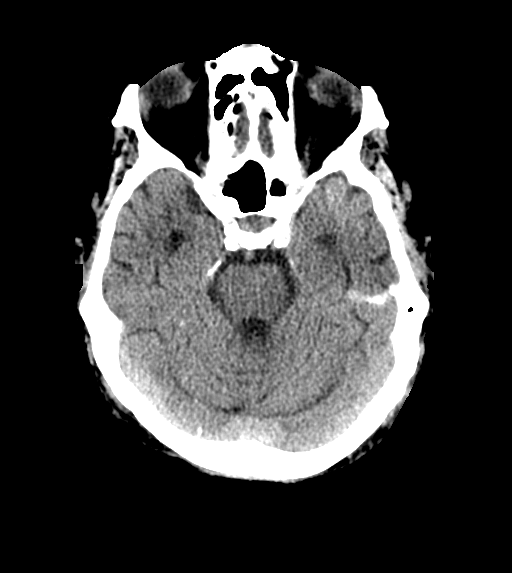
[im 19/38  brain]
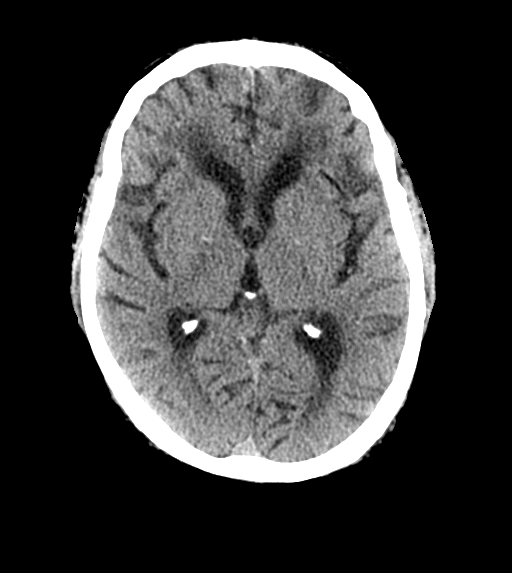
[im 24/38  brain]
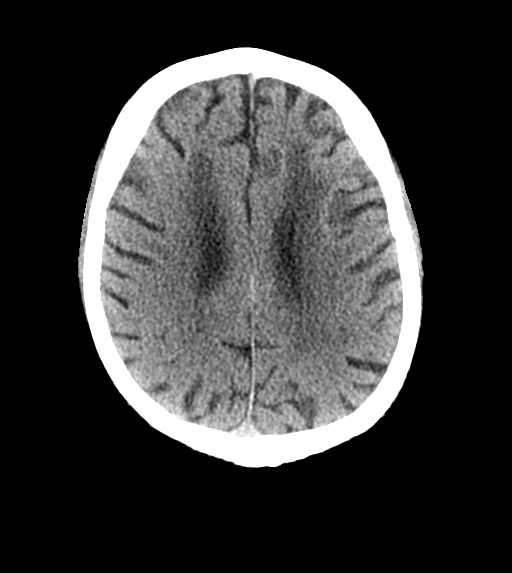
[im 24/38  bone]
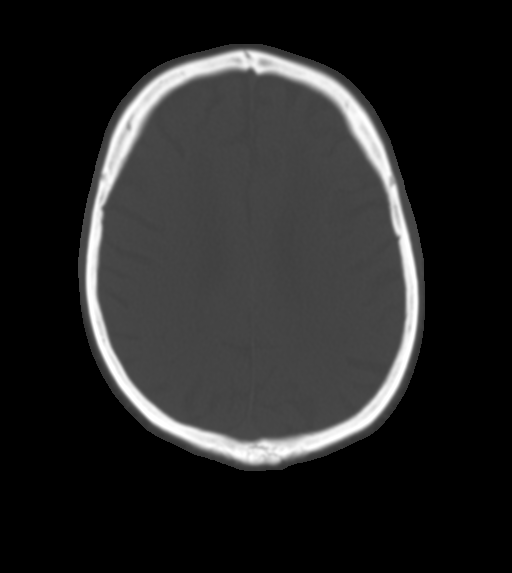
[im 28/38  brain]
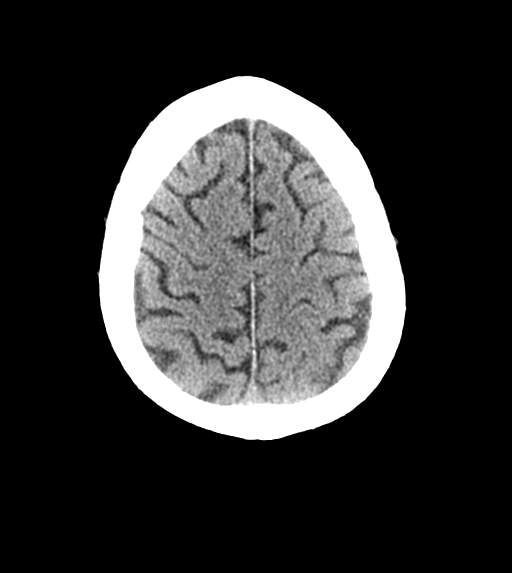
[im 33/38  brain]
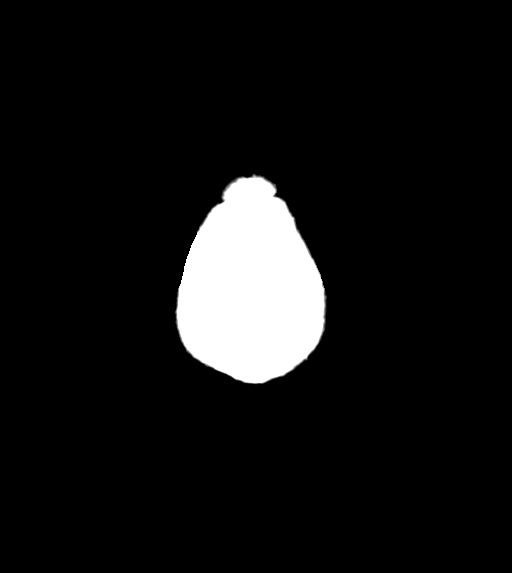

[Series 3: head bone · axial · 0.46mm/px · z∈[-109,-91]mm · 2 of 88 slices shown]
[im 9/88  bone]
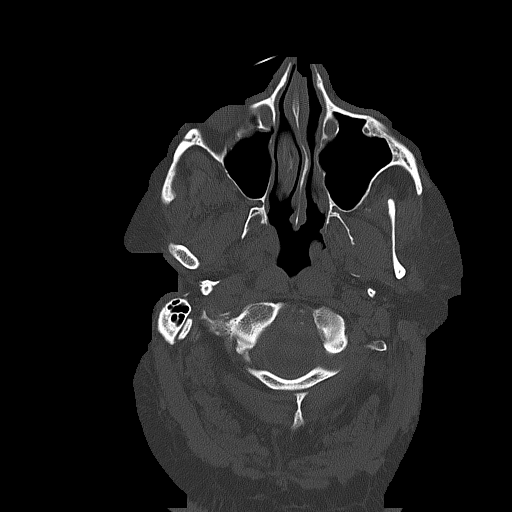
[im 18/88  bone]
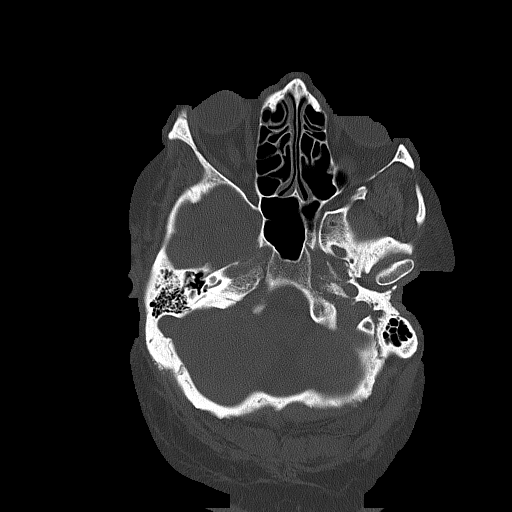

[Series 4: coronal soft tissue · coronal · 0.35mm/px · 3 of 79 slices shown]
[im 27/79  brain]
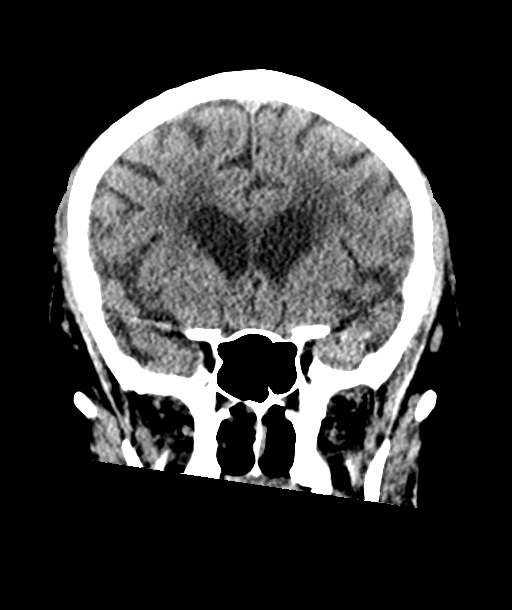
[im 35/79  brain]
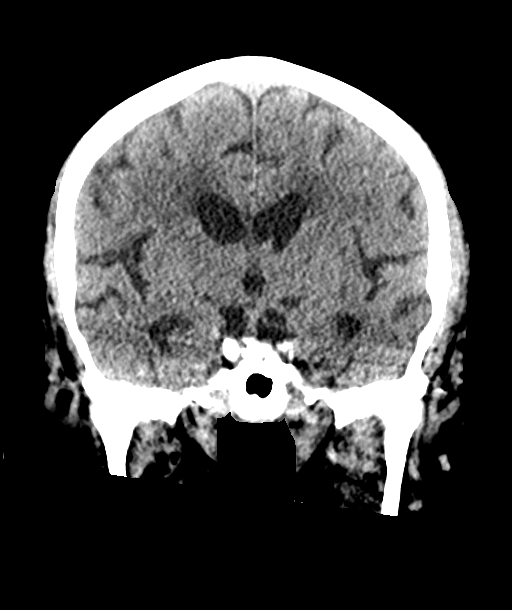
[im 44/79  brain]
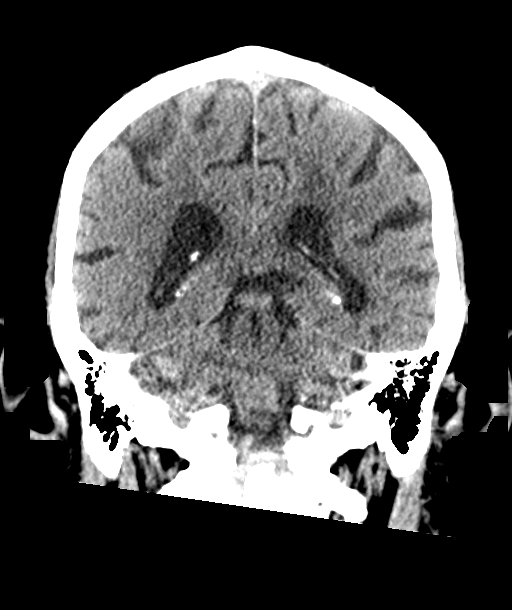

[Series 5: sagittal soft tissue · sagittal · 0.37mm/px · 3 of 62 slices shown]
[im 23/62  brain]
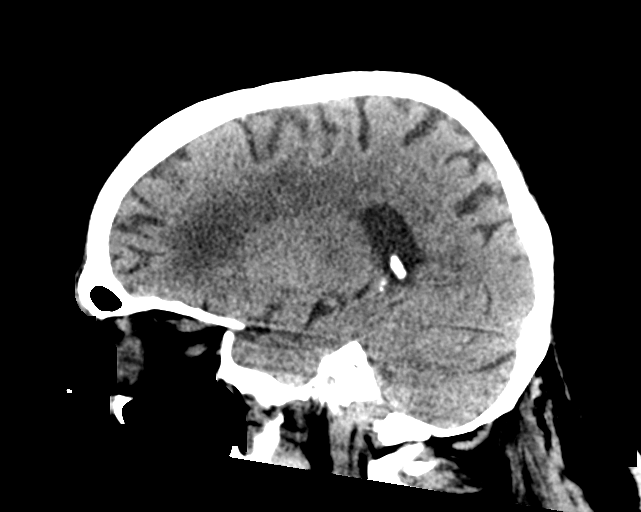
[im 31/62  brain]
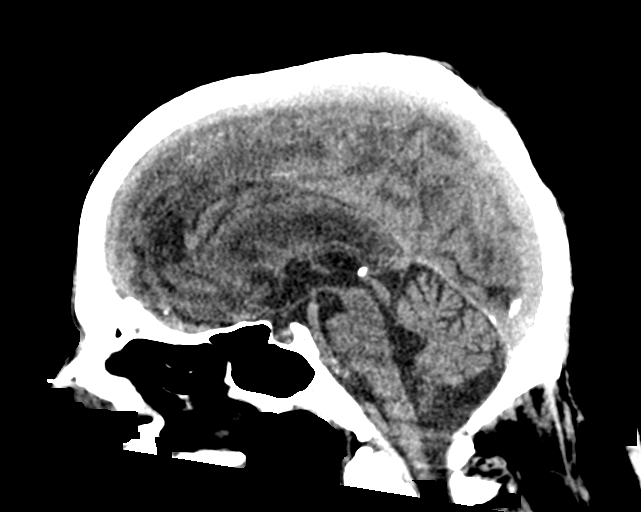
[im 39/62  brain]
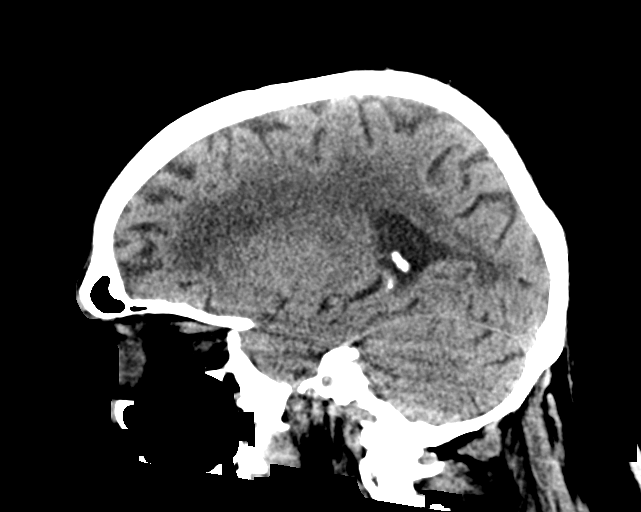

[15 of 47 positions shown; findings below may reference images not displayed]

FINDINGS: Brain: There is mild cerebral atrophy with widening of the
extra-axial spaces and ventricular dilatation.
There are areas of decreased attenuation within the white matter
tracts of the supratentorial brain, consistent with microvascular
disease changes.

Vascular: No hyperdense vessel or unexpected calcification.

Skull: Normal. Negative for fracture or focal lesion.

Sinuses/Orbits: No acute finding.

Other: None.
IMPRESSION: 1. Generalized cerebral atrophy.
2. No acute intracranial abnormality.

## 2021-01-21 MED ORDER — LACTATED RINGERS IV BOLUS
500.0000 mL | Freq: Once | INTRAVENOUS | Status: AC
  Administered 2021-01-21: 500 mL via INTRAVENOUS

## 2021-01-21 NOTE — ED Notes (Signed)
Pt given warm blankets.

## 2021-01-21 NOTE — ED Notes (Signed)
XR at bedside

## 2021-01-21 NOTE — Discharge Instructions (Signed)
As we discussed, I believe that your symptoms are due to dehydration.  Make sure to drink at least 6 to 8 glasses of water a day.  Follow-up with your cardiologist so we can monitor your heart for a longer period of time to make sure that these episodes are not associated with a heart abnormality.  Return to the ER for have any further episodes of passing out or feeling like you cannot pass out, chest pain, shortness of breath.

## 2021-01-21 NOTE — ED Triage Notes (Signed)
EMS brought in from home for syncopal episode. Patient was getting up from sitting on the couch and became dizzy. Per EMS patient was eased to the floor by wife. No head injury. + blood thinner. Denies CP/SOB. Patient reports feeling dizzy for over a week when changing positions. No recent changes in any medications.

## 2021-01-21 NOTE — ED Provider Notes (Incomplete)
[facility] Emergency Department Provider Note  ____________________________________________  Time seen: Approximately 3:09 AM  I have reviewed the triage vital signs and the nursing notes.   HISTORY  Chief Complaint Loss of Consciousness   HPI Harry [name] is a 80 y.o. male with a history of CAD, paroxysmal A. fib, obesity, OSA, BPH, aortic stenosis who presents for evaluation  of near syncope.  Patient reports over the last week he has had several episodes of lightheadedness where he feels like he is going to pass out.  He has never fully passed out but this evening he almost did and actually collapsed to the floor.  He denies head injury.  He did have a fall earlier today this week which was mechanical in nature and he hit his head.  He is on Eliquis for A. fib.  He denies headache.  He denies any associated symptoms with the dizziness including headache, chest pain, shortness of breath.  He does report that he barely drinks any water at home and drinks mostly tomato juice and buttermilk only.  He reports that these episodes of lightheadedness usually happen when he gets up.  No past medical history on file.  Patient Active Problem List   Diagnosis Date Noted  . BPH (benign prostatic hyperplasia) 01/21/2021  . DDD (degenerative disc disease) 01/21/2021  . Hypertensive retinopathy of both eyes 10/24/2018  . Aortic stenosis, mild 07/06/2018  . 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 Systolic murmur 07/06/2017  . Obstructive sleep apnea 03/05/2017  . Paroxysmal atrial fibrillation (HCC) 03/05/2017  . Coronary artery disease involving native coronary artery of native heart without angina pectoris 06/13/2014  . Erectile dysfunction 06/13/2014  . Essential hypertension 06/13/2014  . Deficiency of other specified B group vitamins 04/24/2014  . Hypertriglyceridemia  04/24/2014  . Obesity 04/24/2014  . Family history of colon cancer 03/06/2014  . DMII (diabetes mellitus, type 2) (HCC) 05/01/2012  . Pseudophakia, both eyes 05/01/2012    No past surgical history on file.  Prior to Admission medications   Medication Sig Start Date End Date Taking? Authorizing Provider  apixaban (ELIQUIS) 5 MG TABS tablet Take 1 tablet by mouth 2 (two) times daily. 01/14/21  Yes [provider]  cyanocobalamin (,VITAMIN B-12,) 1000 MCG/ML injection INJECT IM EVERY 2 WEEKS AS DIRECTED. 04/20/19  Yes [provider]  lisinopril (ZESTRIL) 5 MG tablet Take 1 tablet by mouth daily. 06/29/20  Yes [provider]  metFORMIN (GLUCOPHAGE) 1000 MG tablet TAKE 1 (ONE) TABLET TWICE DAILY AS DIRECTED 04/20/20  Yes [provider]  metoprolol succinate (TOPROL-XL) 50 MG 24 hr tablet Take 1 tablet by mouth daily. 07/02/20  Yes [provider]  nitroGLYCERIN (NITROSTAT) 0.4 MG SL tablet Place under the tongue. 11/27/17  Yes [provider]  pravastatin (PRAVACHOL) 20 MG tablet Take 1 tablet by mouth daily. 06/29/20  Yes [provider]  albuterol (PROVENTIL HFA;VENTOLIN HFA) 108 (90 Base) MCG/ACT inhaler Inhale 2 puffs into the lungs every 6 (six) hours as needed for wheezing or shortness of breath. 09/03/16   Schaevitz, Myra Rude, MD  diltiazem (CARDIZEM CD) 240 MG 24 hr capsule Take 240 mg by mouth daily. 12/24/20   [provider]  insulin NPH-regular Human (70-30) 100 UNIT/ML injection Inject into the skin.    [provider]  TRULICITY 1.5 MG/0.5ML SOPN SMARTSIG:0.5 Milliliter(s) SUB-Q Once a Week 10/01/20  [provider]    Allergies Patient has no known allergies.  No family history on file.  Social History Social History   Tobacco Use  . Smoking status: Never Smoker  . Smokeless tobacco: Never Used  Substance Use Topics  . Alcohol use: No  . Drug use: No    Review of  Systems  Constitutional: Negative for fever. + Lightheadedness Eyes: Negative for visual changes. ENT: Negative for sore throat. Neck: No neck pain  Cardiovascular: Negative for chest pain. Respiratory: Negative for shortness of breath. Gastrointestinal: Negative for abdominal pain, vomiting or diarrhea. Genitourinary: Negative for dysuria. Musculoskeletal: Negative for back pain. Skin: Negative for rash. Neurological: Negative for headaches, weakness or numbness. Psych: No SI or HI  ____________________________________________   PHYSICAL EXAM:  VITAL SIGNS: ED Triage Vitals  Enc Vitals Group     BP 01/21/21 0017 (!) 160/57     Pulse Rate 01/21/21 0017 60     Resp 01/21/21 0017 18     Temp 01/21/21 0017 98.3 F (36.8 C)     Temp Source 01/21/21 0017 Oral     SpO2 01/21/21 0017 96 %     Weight 01/21/21 0018 230 lb (104.3 kg)     Height 01/21/21 0018 5\' 7"  (1.702 m)     Head Circumference --      Peak Flow --      Pain Score 01/21/21 0018 0     Pain Loc --      Pain Edu? --      Excl. in GC? --     Constitutional: Alert and oriented. Well appearing and in no apparent distress. HEENT:      Head: Normocephalic and atraumatic.         Eyes: Conjunctivae are normal. Sclera is non-icteric.       Mouth/Throat: Mucous membranes are moist.       Neck: Supple with no signs of meningismus. Cardiovascular: Regular rate and rhythm. No murmurs, gallops, or rubs. 2+ symmetrical distal pulses are present in all extremities. No JVD. Respiratory: Normal respiratory effort. Lungs are clear to auscultation bilaterally.  Gastrointestinal: Soft, non tender, and non distended with positive bowel sounds. No rebound or guarding. Genitourinary: No CVA tenderness. Musculoskeletal:  No edema, cyanosis, or erythema of extremities. Neurologic: Normal speech and language. Face is symmetric. Moving all extremities. No gross focal neurologic deficits are appreciated. Skin: Skin is warm, dry and  intact. No rash noted. Psychiatric: Mood and affect are normal. Speech and behavior are normal.  ____________________________________________   LABS (all labs ordered are listed, but only abnormal results are displayed)  Labs Reviewed  CBC WITH DIFFERENTIAL/PLATELET - Abnormal; Notable for the following components:      Result Value   WBC 10.9 (*)    Hemoglobin 11.6 (*)    HCT 37.0 (*)    MCH 25.6 (*)    RDW 17.6 (*)    Monocytes Absolute 1.1 (*)    All other components within normal limits  COMPREHENSIVE METABOLIC PANEL - Abnormal; Notable for the following components:   Sodium 134 (*)    Potassium 5.3 (*)    Glucose, Bld 185 (*)    All other components within normal limits  URINALYSIS, COMPLETE (UACMP) WITH MICROSCOPIC - Abnormal; Notable for the following components:   Color, Urine STRAW (*)    APPearance CLEAR (*)    Protein, ur 100 (*)    Leukocytes,Ua TRACE (*)    All other components within normal limits  TROPONIN I (HIGH SENSITIVITY)  TROPONIN I (HIGH SENSITIVITY)   ____________________________________________  EKG  ED ECG REPORT I, Nita Sickle, the attending physician, personally viewed and interpreted this ECG.  Sinus rhythm with a right bundle branch block, rate of 61, normal QTC and normal axis with no ST elevations or depressions.  No significant changes when compared to prior from 2017 ____________________________________________  RADIOLOGY  I have personally reviewed the images performed during this visit and I agree with the Radiologist's read.   Interpretation by Radiologist:  CT Head Wo Contrast  Result Date: 01/21/2021 CLINICAL DATA:  Syncopal episode. EXAM: CT HEAD WITHOUT CONTRAST TECHNIQUE: Contiguous axial images were obtained from the base of the skull through the vertex without intravenous contrast. COMPARISON:  None. FINDINGS: Brain: There is mild cerebral atrophy with widening of the extra-axial spaces and ventricular dilatation. There  are areas of decreased attenuation within the white matter tracts of the supratentorial brain, consistent with microvascular disease changes. Vascular: No hyperdense vessel or unexpected calcification. Skull: Normal. Negative for fracture or focal lesion. Sinuses/Orbits: No acute finding. Other: None. IMPRESSION: 1. Generalized cerebral atrophy. 2. No acute intracranial abnormality. Electronically Signed   By: Aram Candela M.D.   On: 01/21/2021 02:06   DG Chest Portable 1 View  Result Date: 01/21/2021 CLINICAL DATA:  Syncopal episode. EXAM: PORTABLE CHEST 1 VIEW COMPARISON:  September 03, 2016 FINDINGS: The cardiac silhouette is mildly enlarged. Both lungs are clear. Degenerative changes seen throughout the thoracic spine. IMPRESSION: No active disease. Electronically Signed   By: Aram Candela M.D.   On: 01/21/2021 02:04     ____________________________________________   PROCEDURES  Procedure(s) performed: .1-3 Lead EKG Interpretation Performed by: Nita Sickle, MD Authorized by: Nita Sickle, MD     Interpretation: non-specific     ECG rate assessment: normal     Rhythm: sinus rhythm     Ectopy: none     Conduction: normal     Critical Care performed:  None ____________________________________________   INITIAL IMPRESSION / ASSESSMENT AND PLAN / ED COURSE  80 y.o. male with a history of CAD, paroxysmal A. fib, obesity, OSA, BPH, aortic stenosis who presents for evaluation  of near syncope.  Seems like patient has been having these episodes for over a week now mostly when he stands up.  He reports drinking almost in the water on a daily basis.  Only drinks tomato juice and buttermilk.  He is neurologically intact with normal vital signs, and a nonfocal exam on arrival.  EKG showing no signs of dysrhythmias or ischemia.  Patient was monitored on telemetry for 3 hours with no signs of dysrhythmias.  ***Review  labs Independently visualization and interpretation of  Radiology Review old medical records Discuss care with family or another doc Get history from family Independent visualization of rhythm strip      _____________________________________________ Please note:  Patient was evaluated in Emergency Department today for the symptoms described in the history of present illness. Patient was evaluated in the context of the global COVID-19 pandemic, which necessitated consideration that the patient might be at risk for infection with the SARS-CoV-2 virus that causes COVID-19. Institutional protocols and algorithms that pertain to the evaluation of patients at risk for COVID-19 are in a state of rapid change based on information released by regulatory bodies including the CDC and federal and state organizations. These policies and algorithms were followed during the patient's care in the ED.  Some ED evaluations and interventions may be delayed as  a result of limited staffing during the pandemic.   [place] Controlled Substance Database was reviewed by me. ____________________________________________   FINAL CLINICAL IMPRESSION(S) / ED DIAGNOSES   Final diagnoses:  None      NEW MEDICATIONS STARTED DURING THIS VISIT:  ED Discharge Orders    None       Note:  This document was prepared using Dragon voice recognition software and may include unintentional dictation errors.

## 2021-01-21 NOTE — ED Provider Notes (Signed)
[facility] Emergency Department Provider Note  ____________________________________________  Time seen: Approximately 3:09 AM  I have reviewed the triage vital signs and the nursing notes.   HISTORY  Chief Complaint Loss of Consciousness   HPI Harry [name] is a 80 y.o. male with a history of CAD, paroxysmal A. fib, obesity, OSA, BPH, aortic stenosis who presents for evaluation  of near syncope.  Patient reports over the last week he has had several episodes of lightheadedness where he feels like he is going to pass out.  He has never fully passed out but this evening he almost did and actually collapsed to the floor.  He denies head injury.  He did have a fall earlier this week which was mechanical in nature and he hit his head.  He is on Eliquis for A. fib.  He denies headache.  He denies any associated symptoms with the dizziness including headache, chest pain, shortness of breath.  He does report that he barely drinks any water at home and drinks mostly tomato juice and buttermilk only.  He reports that these episodes of lightheadedness usually happen when he gets up.  No past medical history on file.  Patient Active Problem List   Diagnosis Date Noted  . BPH (benign prostatic hyperplasia) 01/21/2021  . DDD (degenerative disc disease) 01/21/2021  . Hypertensive retinopathy of both eyes 10/24/2018  . Aortic stenosis, mild 07/06/2018  . 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 Systolic murmur 07/06/2017  . Obstructive sleep apnea 03/05/2017  . Paroxysmal atrial fibrillation (HCC) 03/05/2017  . Coronary artery disease involving native coronary artery of native heart without angina pectoris 06/13/2014  . Erectile dysfunction 06/13/2014  . Essential hypertension 06/13/2014  . Deficiency of other specified B group vitamins 04/24/2014  . Hypertriglyceridemia 04/24/2014   . Obesity 04/24/2014  . Family history of colon cancer 03/06/2014  . DMII (diabetes mellitus, type 2) (HCC) 05/01/2012  . Pseudophakia, both eyes 05/01/2012    No past surgical history on file.  Prior to Admission medications   Medication Sig Start Date End Date Taking? Authorizing Provider  apixaban (ELIQUIS) 5 MG TABS tablet Take 1 tablet by mouth 2 (two) times daily. 01/14/21  Yes [provider]  cyanocobalamin (,VITAMIN B-12,) 1000 MCG/ML injection INJECT IM EVERY 2 WEEKS AS DIRECTED. 04/20/19  Yes [provider]  lisinopril (ZESTRIL) 5 MG tablet Take 1 tablet by mouth daily. 06/29/20  Yes [provider]  metFORMIN (GLUCOPHAGE) 1000 MG tablet TAKE 1 (ONE) TABLET TWICE DAILY AS DIRECTED 04/20/20  Yes [provider]  metoprolol succinate (TOPROL-XL) 50 MG 24 hr tablet Take 1 tablet by mouth daily. 07/02/20  Yes [provider]  nitroGLYCERIN (NITROSTAT) 0.4 MG SL tablet Place under the tongue. 11/27/17  Yes [provider]  pravastatin (PRAVACHOL) 20 MG tablet Take 1 tablet by mouth daily. 06/29/20  Yes [provider]  albuterol (PROVENTIL HFA;VENTOLIN HFA) 108 (90 Base) MCG/ACT inhaler Inhale 2 puffs into the lungs every 6 (six) hours as needed for wheezing or shortness of breath. 09/03/16   Schaevitz, Myra Rude, MD  diltiazem (CARDIZEM CD) 240 MG 24 hr capsule Take 240 mg by mouth daily. 12/24/20   [provider]  insulin NPH-regular Human (70-30) 100 UNIT/ML injection Inject into the skin.    [provider]  TRULICITY 1.5 MG/0.5ML SOPN SMARTSIG:0.5 Milliliter(s) SUB-Q Once a Week 10/01/20   [provider]    Allergies Patient has no known allergies.  No family history on file.  Social History Social History   Tobacco Use  . Smoking status: Never Smoker  . Smokeless tobacco: Never Used  Substance Use Topics  . Alcohol use: No  . Drug use: No    Review of  Systems  Constitutional: Negative for fever. + Lightheadedness Eyes: Negative for visual changes. ENT: Negative for sore throat. Neck: No neck pain  Cardiovascular: Negative for chest pain. Respiratory: Negative for shortness of breath. Gastrointestinal: Negative for abdominal pain, vomiting or diarrhea. Genitourinary: Negative for dysuria. Musculoskeletal: Negative for back pain. Skin: Negative for rash. Neurological: Negative for headaches, weakness or numbness. Psych: No SI or HI  ____________________________________________   PHYSICAL EXAM:  VITAL SIGNS: ED Triage Vitals  Enc Vitals Group     BP 01/21/21 0017 (!) 160/57     Pulse Rate 01/21/21 0017 60     Resp 01/21/21 0017 18     Temp 01/21/21 0017 98.3 F (36.8 C)     Temp Source 01/21/21 0017 Oral     SpO2 01/21/21 0017 96 %     Weight 01/21/21 0018 230 lb (104.3 kg)     Height 01/21/21 0018 5\' 7"  (1.702 m)     Head Circumference --      Peak Flow --      Pain Score 01/21/21 0018 0     Pain Loc --      Pain Edu? --      Excl. in GC? --     Constitutional: Alert and oriented. Well appearing and in no apparent distress. HEENT:      Head: Normocephalic and atraumatic.         Eyes: Conjunctivae are normal. Sclera is non-icteric.       Mouth/Throat: Mucous membranes are moist.       Neck: Supple with no signs of meningismus. Cardiovascular: Regular rate and rhythm. No murmurs, gallops, or rubs. 2+ symmetrical distal pulses are present in all extremities. No JVD. Respiratory: Normal respiratory effort. Lungs are clear to auscultation bilaterally.  Gastrointestinal: Soft, non tender, and non distended with positive bowel sounds. No rebound or guarding. Genitourinary: No CVA tenderness. Musculoskeletal:  No edema, cyanosis, or erythema of extremities. Neurologic: Normal speech and language. Face is symmetric. Moving all extremities. No gross focal neurologic deficits are appreciated. Skin: Skin is warm, dry and  intact. No rash noted. Psychiatric: Mood and affect are normal. Speech and behavior are normal.  ____________________________________________   LABS (all labs ordered are listed, but only abnormal results are displayed)  Labs Reviewed  CBC WITH DIFFERENTIAL/PLATELET - Abnormal; Notable for the following components:      Result Value   WBC 10.9 (*)    Hemoglobin 11.6 (*)    HCT 37.0 (*)    MCH 25.6 (*)    RDW 17.6 (*)    Monocytes Absolute 1.1 (*)    All other components within normal limits  COMPREHENSIVE METABOLIC PANEL - Abnormal; Notable for the following components:   Sodium 134 (*)    Potassium 5.3 (*)    Glucose, Bld 185 (*)    All other components within normal limits  URINALYSIS, COMPLETE (UACMP) WITH MICROSCOPIC - Abnormal; Notable for the following components:   Color, Urine STRAW (*)    APPearance CLEAR (*)    Protein, ur 100 (*)    Leukocytes,Ua TRACE (*)    All other components within normal limits  TROPONIN  I (HIGH SENSITIVITY)  TROPONIN I (HIGH SENSITIVITY)   ____________________________________________  EKG  ED ECG REPORT I, Nita Sicklearolina Arville Postlewaite, the attending physician, personally viewed and interpreted this ECG.  Sinus rhythm with a right bundle branch block, rate of 61, normal QTC and normal axis with no ST elevations or depressions.  No significant changes when compared to prior from 2017 ____________________________________________  RADIOLOGY  I have personally reviewed the images performed during this visit and I agree with the Radiologist's read.   Interpretation by Radiologist:  CT Head Wo Contrast  Result Date: 01/21/2021 CLINICAL DATA:  Syncopal episode. EXAM: CT HEAD WITHOUT CONTRAST TECHNIQUE: Contiguous axial images were obtained from the base of the skull through the vertex without intravenous contrast. COMPARISON:  None. FINDINGS: Brain: There is mild cerebral atrophy with widening of the extra-axial spaces and ventricular dilatation. There  are areas of decreased attenuation within the white matter tracts of the supratentorial brain, consistent with microvascular disease changes. Vascular: No hyperdense vessel or unexpected calcification. Skull: Normal. Negative for fracture or focal lesion. Sinuses/Orbits: No acute finding. Other: None. IMPRESSION: 1. Generalized cerebral atrophy. 2. No acute intracranial abnormality. Electronically Signed   By: Aram Candelahaddeus  Houston M.D.   On: 01/21/2021 02:06   DG Chest Portable 1 View  Result Date: 01/21/2021 CLINICAL DATA:  Syncopal episode. EXAM: PORTABLE CHEST 1 VIEW COMPARISON:  September 03, 2016 FINDINGS: The cardiac silhouette is mildly enlarged. Both lungs are clear. Degenerative changes seen throughout the thoracic spine. IMPRESSION: No active disease. Electronically Signed   By: Aram Candelahaddeus  Houston M.D.   On: 01/21/2021 02:04     ____________________________________________   PROCEDURES  Procedure(s) performed: .1-3 Lead EKG Interpretation Performed by: Nita SickleVeronese, [place], MD Authorized by: Nita SickleVeronese, [place], MD     Interpretation: non-specific     ECG rate assessment: normal     Rhythm: sinus rhythm     Ectopy: none     Conduction: normal     Critical Care performed:  None ____________________________________________   INITIAL IMPRESSION / ASSESSMENT AND PLAN / ED COURSE  80 y.o. male with a history of CAD, paroxysmal A. fib, obesity, OSA, BPH, aortic stenosis who presents for evaluation  of near syncope.  Seems like patient has been having these episodes for over a week now mostly when he stands up.  He reports drinking almost in the water on a daily basis.  Only drinks tomato juice and buttermilk.  He is neurologically intact with normal vital signs, and a nonfocal exam on arrival.  EKG showing no signs of dysrhythmias or ischemia.  Patient was monitored on telemetry for 3 hours with no signs of dysrhythmias. 2 HS-trop negative with no signs of cardiac ischemia.  UA negative for  UTI or ketones.  Patient with mild anemia with hemoglobin of 11.6.  Chemistry panel with normal kidney function, no significant electrolyte derangements.  Head CT was done since patient had a fall earlier this week and shows no signs of intracranial bleed.  Chest x-ray does not show any evidence of edema, pneumonia, an enlarged mediastinum.  After receiving 500 cc bolus patient feels markedly improved and no longer feeling dizzy.  Orthostatic vital signs are negative.  Patient has no chest pain, no tachypnea, no tachycardia, no hypoxia, and is on Eliquis therefore low suspicion for PE.  No signs of stroke on exam.  At this time I believe his presentation is most likely due to mild dehydration on a patient who does not drink water.  We discussed that with  him and his wife was at bedside.  Wife and son agree and felt the patient's symptoms were also due to dehydration.  However did discuss with him the importance of ruling out dysrhythmias and therefore recommended close follow-up with his cardiologist for a Holter monitor.  He has an appointment coming up within the next week with with his cardiologist.  In the meantime recommended pushing fluids and returning to the emergency room if he develops any further episodes of syncope or near syncope, any signs of stroke, chest pain or shortness of breath.  History gathered from patient, son and wife who are at bedside.  Old medical records reviewed.  Patient was placed on telemetry for monitoring for any signs of dysrhythmias.     _____________________________________________ Please note:  Patient was evaluated in Emergency Department today for the symptoms described in the history of present illness. Patient was evaluated in the context of the global COVID-19 pandemic, which necessitated consideration that the patient might be at risk for infection with the SARS-CoV-2 virus that causes COVID-19. Institutional protocols and algorithms that pertain to the evaluation  of patients at risk for COVID-19 are in a state of rapid change based on information released by regulatory bodies including the CDC and federal and state organizations. These policies and algorithms were followed during the patient's care in the ED.  Some ED evaluations and interventions may be delayed as a result of limited staffing during the pandemic.   [place] Controlled Substance Database was reviewed by me. ____________________________________________   FINAL CLINICAL IMPRESSION(S) / ED DIAGNOSES   Final diagnoses:  Near syncope  Dehydration      NEW MEDICATIONS STARTED DURING THIS VISIT:  ED Discharge Orders    None       Note:  This document was prepared using Dragon voice recognition software and may include unintentional dictation errors.    Nita Sickle, MD 01/21/21 [phone]

## 2021-02-17 ENCOUNTER — Encounter: Payer: Self-pay | Admitting: Internal Medicine

## 2021-02-17 ENCOUNTER — Emergency Department: Payer: Medicare HMO

## 2021-02-17 ENCOUNTER — Inpatient Hospital Stay
Admission: EM | Admit: 2021-02-17 | Discharge: 2021-02-21 | DRG: 229 | Disposition: A | Discharge status: Home / Self Care | Payer: Medicare HMO | Attending: Internal Medicine | Admitting: Internal Medicine

## 2021-02-17 DIAGNOSIS — L8990 Pressure ulcer of unspecified site, unspecified stage: Secondary | ICD-10-CM | POA: Insufficient documentation

## 2021-02-17 DIAGNOSIS — I10 Essential (primary) hypertension: Secondary | ICD-10-CM | POA: Diagnosis present

## 2021-02-17 DIAGNOSIS — Z20822 Contact with and (suspected) exposure to covid-19: Secondary | ICD-10-CM | POA: Diagnosis present

## 2021-02-17 DIAGNOSIS — I2510 Atherosclerotic heart disease of native coronary artery without angina pectoris: Secondary | ICD-10-CM | POA: Diagnosis present

## 2021-02-17 DIAGNOSIS — I480 Paroxysmal atrial fibrillation: Secondary | ICD-10-CM | POA: Diagnosis present

## 2021-02-17 DIAGNOSIS — E119 Type 2 diabetes mellitus without complications: Secondary | ICD-10-CM | POA: Diagnosis present

## 2021-02-17 DIAGNOSIS — Z006 Encounter for examination for normal comparison and control in clinical research program: Secondary | ICD-10-CM

## 2021-02-17 DIAGNOSIS — Z794 Long term (current) use of insulin: Secondary | ICD-10-CM

## 2021-02-17 DIAGNOSIS — I442 Atrioventricular block, complete: Secondary | ICD-10-CM | POA: Diagnosis not present

## 2021-02-17 DIAGNOSIS — Z7901 Long term (current) use of anticoagulants: Secondary | ICD-10-CM

## 2021-02-17 DIAGNOSIS — I455 Other specified heart block: Secondary | ICD-10-CM

## 2021-02-17 DIAGNOSIS — L89151 Pressure ulcer of sacral region, stage 1: Secondary | ICD-10-CM | POA: Diagnosis present

## 2021-02-17 DIAGNOSIS — N400 Enlarged prostate without lower urinary tract symptoms: Secondary | ICD-10-CM | POA: Diagnosis present

## 2021-02-17 DIAGNOSIS — R42 Dizziness and giddiness: Secondary | ICD-10-CM | POA: Diagnosis not present

## 2021-02-17 DIAGNOSIS — R55 Syncope and collapse: Secondary | ICD-10-CM

## 2021-02-17 DIAGNOSIS — I350 Nonrheumatic aortic (valve) stenosis: Secondary | ICD-10-CM | POA: Diagnosis present

## 2021-02-17 DIAGNOSIS — R001 Bradycardia, unspecified: Secondary | ICD-10-CM | POA: Diagnosis not present

## 2021-02-17 DIAGNOSIS — Z955 Presence of coronary angioplasty implant and graft: Secondary | ICD-10-CM

## 2021-02-17 DIAGNOSIS — Z79899 Other long term (current) drug therapy: Secondary | ICD-10-CM

## 2021-02-17 DIAGNOSIS — Z7984 Long term (current) use of oral hypoglycemic drugs: Secondary | ICD-10-CM

## 2021-02-17 DIAGNOSIS — G4733 Obstructive sleep apnea (adult) (pediatric): Secondary | ICD-10-CM | POA: Diagnosis present

## 2021-02-17 HISTORY — DX: Type 2 diabetes mellitus without complications: E11.9

## 2021-02-17 HISTORY — DX: Atherosclerotic heart disease of native coronary artery without angina pectoris: I25.10

## 2021-02-17 HISTORY — DX: Paroxysmal atrial fibrillation: I48.0

## 2021-02-17 HISTORY — DX: Nonrheumatic aortic (valve) stenosis: I35.0

## 2021-02-17 HISTORY — DX: Essential (primary) hypertension: I10

## 2021-02-17 LAB — COMPREHENSIVE METABOLIC PANEL WITH GFR
ALT: 18 U/L (ref 0–44)
AST: 26 U/L (ref 15–41)
Albumin: 3.5 g/dL (ref 3.5–5.0)
Alkaline Phosphatase: 63 U/L (ref 38–126)
Anion gap: 12 (ref 5–15)
BUN: 17 mg/dL (ref 8–23)
CO2: 21 mmol/L — ABNORMAL LOW (ref 22–32)
Calcium: 8.7 mg/dL — ABNORMAL LOW (ref 8.9–10.3)
Chloride: 102 mmol/L (ref 98–111)
Creatinine, Ser: 1 mg/dL (ref 0.61–1.24)
GFR, Estimated: >60 mL/min
Glucose, Bld: 180 mg/dL — ABNORMAL HIGH (ref 70–99)
Potassium: 5.1 mmol/L (ref 3.5–5.1)
Sodium: 135 mmol/L (ref 135–145)
Total Bilirubin: 0.7 mg/dL (ref 0.3–1.2)
Total Protein: 6.9 g/dL (ref 6.5–8.1)

## 2021-02-17 LAB — CBC WITH DIFFERENTIAL/PLATELET
Abs Immature Granulocytes: 0.05 10*3/uL (ref 0.00–0.07)
Basophils Absolute: 0.1 10*3/uL (ref 0.0–0.1)
Basophils Relative: 1 %
Eosinophils Absolute: 0.7 10*3/uL — ABNORMAL HIGH (ref 0.0–0.5)
Eosinophils Relative: 5 %
HCT: 34.6 % — ABNORMAL LOW (ref 39.0–52.0)
Hemoglobin: 11.4 g/dL — ABNORMAL LOW (ref 13.0–17.0)
Immature Granulocytes: 0 %
Lymphocytes Relative: 23 %
Lymphs Abs: 2.8 10*3/uL (ref 0.7–4.0)
MCH: 26.6 pg (ref 26.0–34.0)
MCHC: 32.9 g/dL (ref 30.0–36.0)
MCV: 80.8 fL (ref 80.0–100.0)
Monocytes Absolute: 1.3 10*3/uL — ABNORMAL HIGH (ref 0.1–1.0)
Monocytes Relative: 10 %
Neutro Abs: 7.5 10*3/uL (ref 1.7–7.7)
Neutrophils Relative %: 61 %
Platelets: 276 10*3/uL (ref 150–400)
RBC: 4.28 MIL/uL (ref 4.22–5.81)
RDW: 17.3 % — ABNORMAL HIGH (ref 11.5–15.5)
WBC: 12.5 10*3/uL — ABNORMAL HIGH (ref 4.0–10.5)
nRBC: 0 % (ref 0.0–0.2)

## 2021-02-17 LAB — TROPONIN I (HIGH SENSITIVITY)
Troponin I (High Sensitivity): 9 ng/L (ref <18)
Troponin I (High Sensitivity): 12 ng/L (ref <18)

## 2021-02-17 LAB — TSH: TSH: 3.519 u[IU]/mL (ref 0.350–4.500)

## 2021-02-17 LAB — URINALYSIS, COMPLETE (UACMP) WITH MICROSCOPIC
Bacteria, UA: NONE SEEN
Bilirubin Urine: NEGATIVE
Glucose, UA: 50 mg/dL — AB
Hgb urine dipstick: NEGATIVE
Ketones, ur: 5 mg/dL — AB
Nitrite: NEGATIVE
Protein, ur: 30 mg/dL — AB
Specific Gravity, Urine: 1.014 (ref 1.005–1.030)
pH: 5 (ref 5.0–8.0)

## 2021-02-17 LAB — T4, FREE: Free T4: 0.59 ng/dL — ABNORMAL LOW (ref 0.61–1.12)

## 2021-02-17 LAB — BRAIN NATRIURETIC PEPTIDE: B Natriuretic Peptide: 36.7 pg/mL (ref 0.0–100.0)

## 2021-02-17 LAB — CBG MONITORING, ED: Glucose-Capillary: 191 mg/dL — ABNORMAL HIGH (ref 70–99)

## 2021-02-17 IMAGING — MR MR MRA HEAD W/O CM
1 series · 19 of 48 positions shown · non-contrast
Comparison: No pertinent prior exam.
COMPARISON: No pertinent prior exam.

CLINICAL DATA: Dizziness.

EXAM:
MRI HEAD WITHOUT CONTRAST
MRA HEAD WITHOUT CONTRAST
TECHNIQUE: Multiplanar, multi-echo pulse sequences of the brain and surrounding
structures were acquired without intravenous contrast. Angiographic
images of the Circle of Willis were acquired using MRA technique
without intravenous contrast.

[Series 1: TOF · axial · 0.5mm · 0.41mm/px · z∈[-98,-3]mm · 19 of 205 slices shown]
[im 1/205]
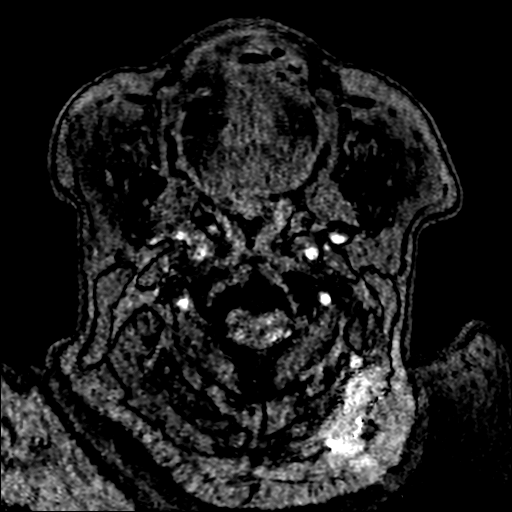
[im 5/205]
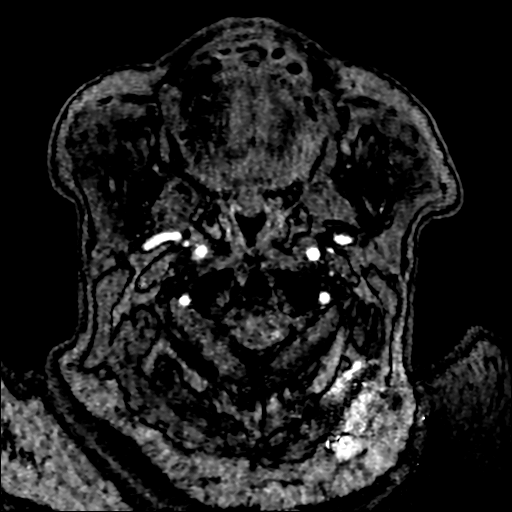
[im 9/205]
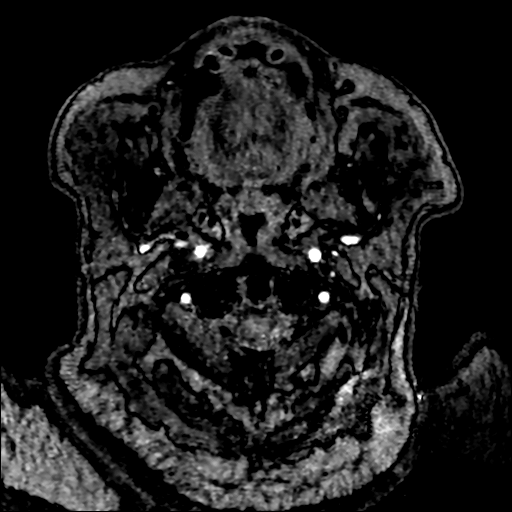
[im 14/205]
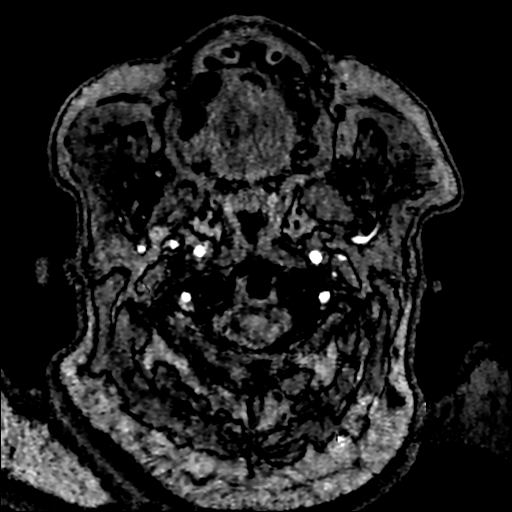
[im 18/205]
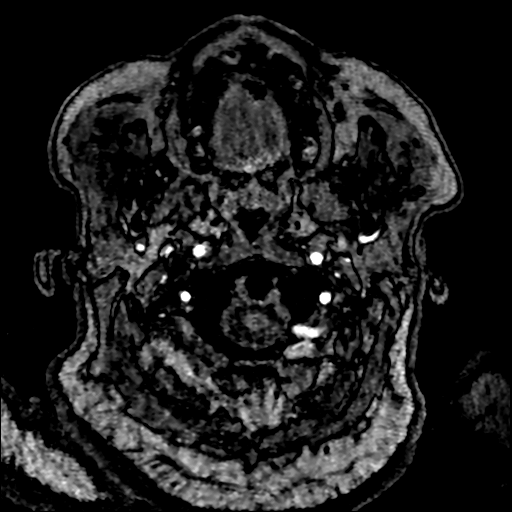
[im 22/205]
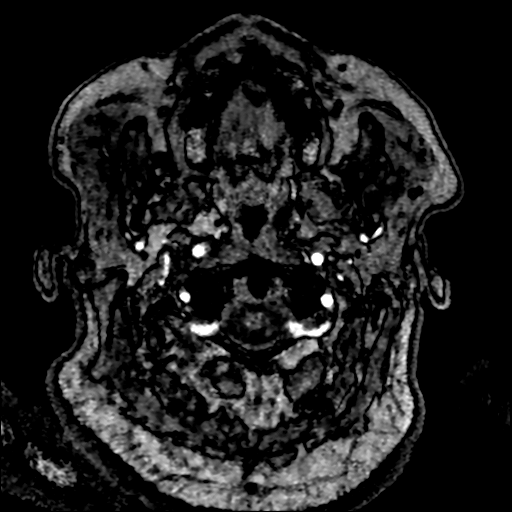
[im 27/205]
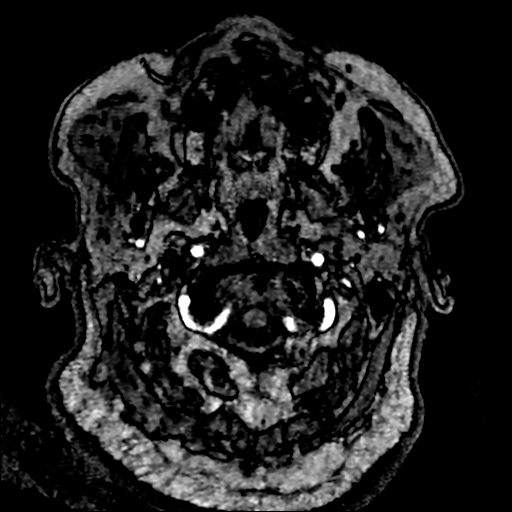
[im 31/205]
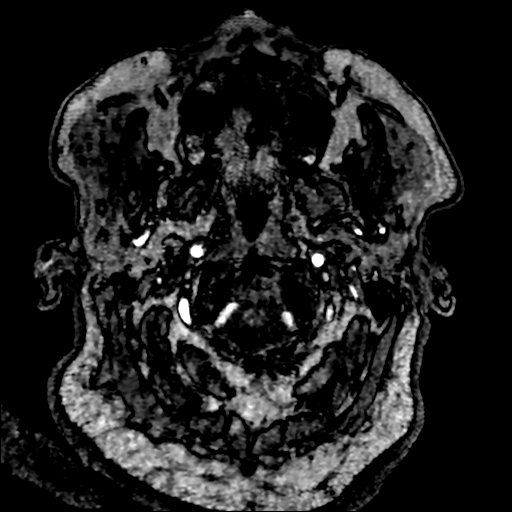
[im 35/205]
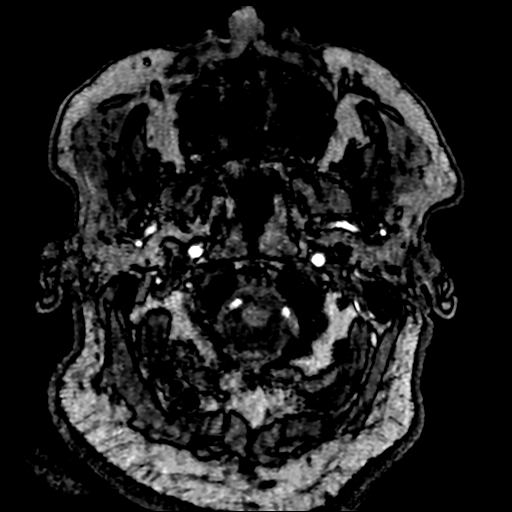
[im 40/205]
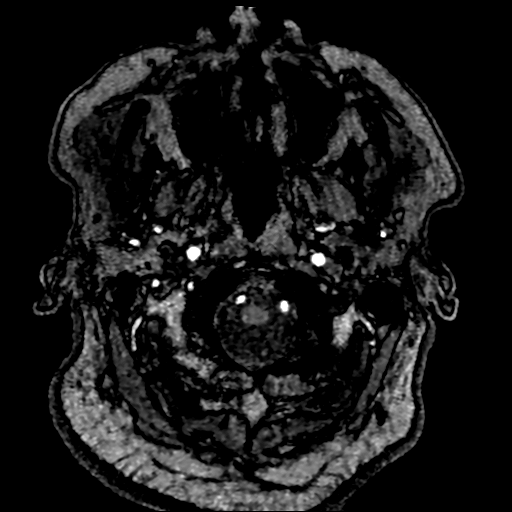
[im 44/205]
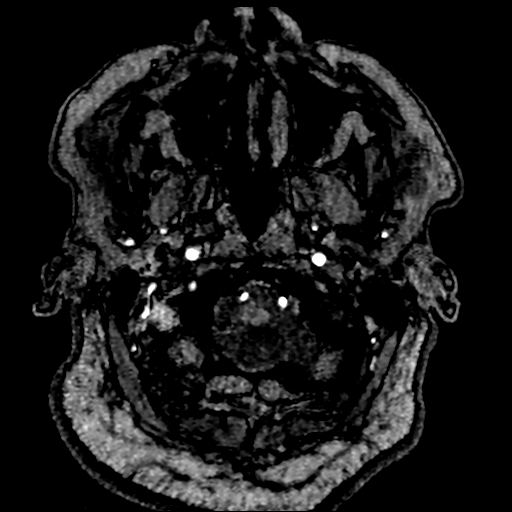
[im 66/205]
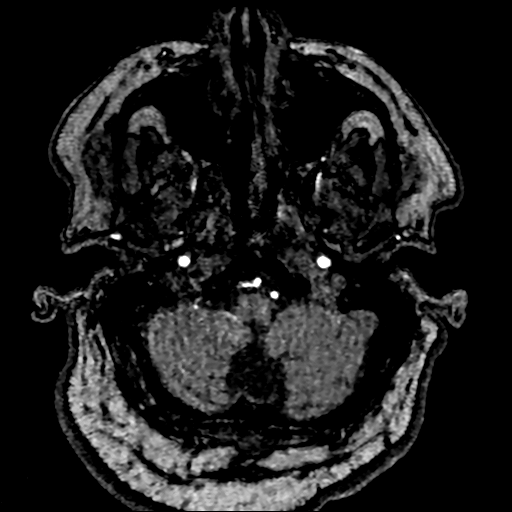
[im 92/205]
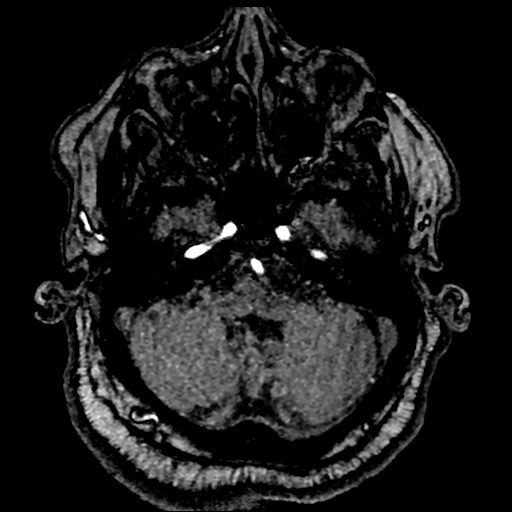
[im 105/205]
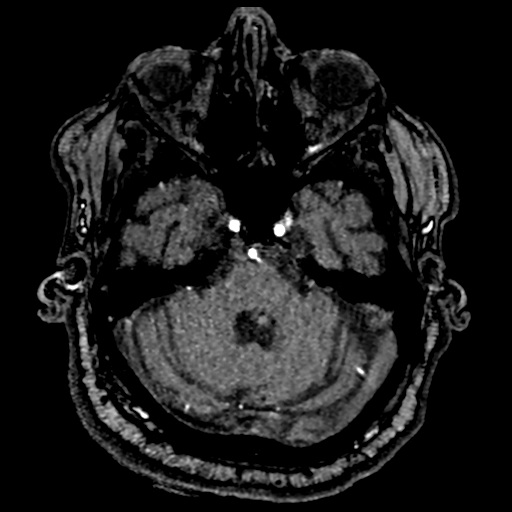
[im 118/205]
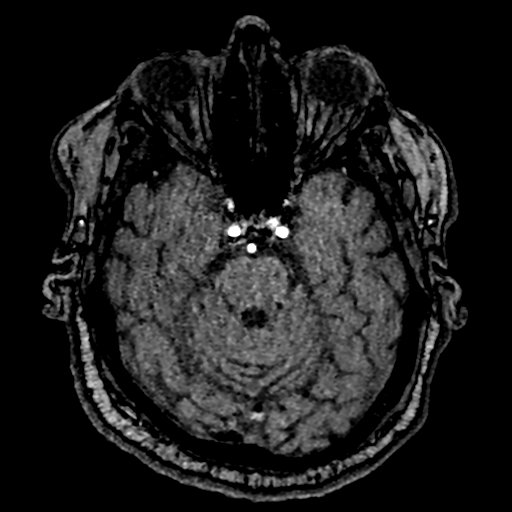
[im 144/205]
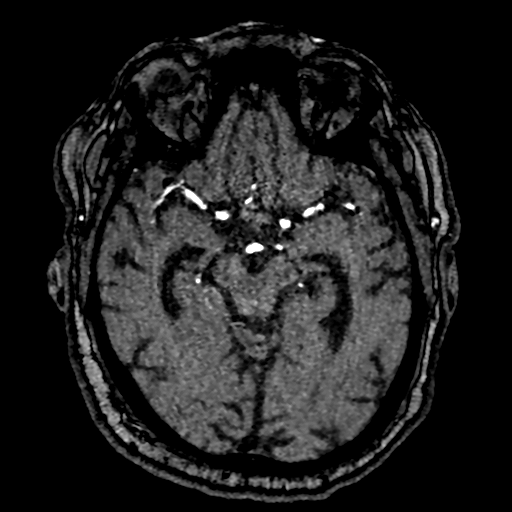
[im 170/205]
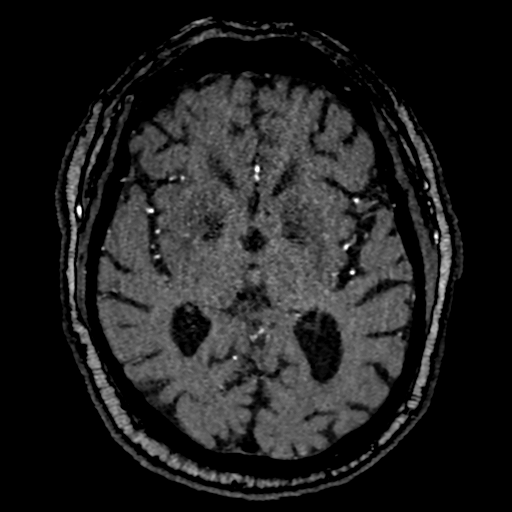
[im 174/205]
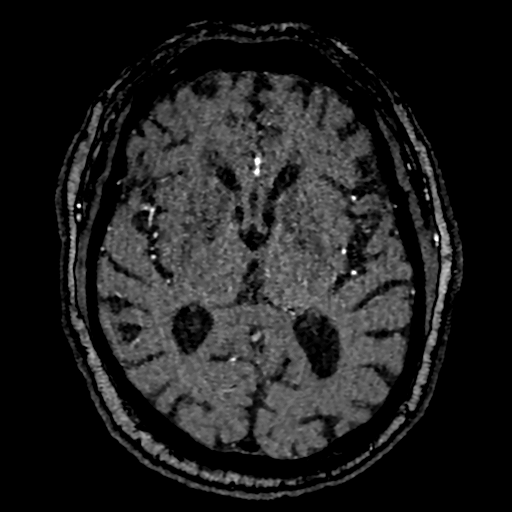
[im 196/205]
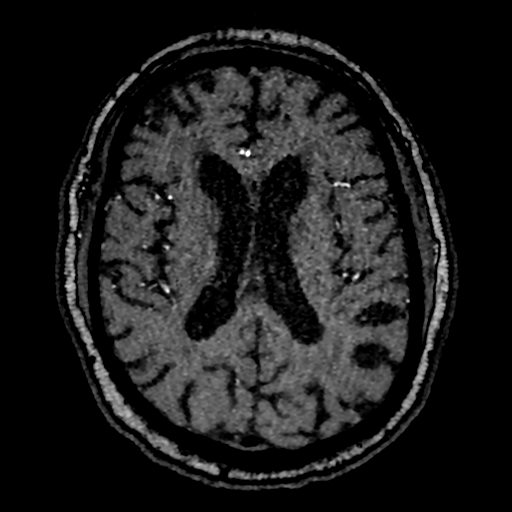

[19 of 48 positions shown; findings below may reference images not displayed]

FINDINGS: MRI HEAD FINDINGS

Brain: No acute infarction, hemorrhage, extra-axial collection or
mass lesion. Moderate patchy and confluent T2/FLAIR hyperintensity
within the white matter, nonspecific but most likely related to
chronic microvascular ischemic disease. Moderate cerebral atrophy
with ventriculomegaly that is most likely ex vacuo in etiology.

Vascular: See below.

Skull and upper cervical spine: Normal marrow signal.

Sinuses/Orbits: Clear sinuses.  Unremarkable orbits.

Other: No sizable mastoid effusions.

MRA HEAD FINDINGS

Anterior circulation: Patent bilateral intracranial internal carotid
arteries, MCAs and ACAs without proximal hemodynamically significant
stenosis. No aneurysm.

Posterior circulation: Patent intradural vertebral arteries, basilar
arteries and posterior cerebral arteries without evidence of
proximal hemodynamically significant stenosis. No visible aneurysm.
IMPRESSION: MRI:

1. No evidence of acute intracranial abnormality.  No acute infarct.
2. Moderate chronic microvascular ischemic disease.
3. Similar ventriculomegaly, which is most likely secondary to
moderate brain atrophy.

MRA:

No large vessel occlusion or proximal hemodynamically significant
stenosis.

## 2021-02-17 IMAGING — MR MR HEAD W/O CM
13 series · 48 of 48 positions shown · non-contrast
Comparison: No pertinent prior exam.
COMPARISON: No pertinent prior exam.

CLINICAL DATA: Dizziness.

EXAM:
MRI HEAD WITHOUT CONTRAST
MRA HEAD WITHOUT CONTRAST
TECHNIQUE: Multiplanar, multi-echo pulse sequences of the brain and surrounding
structures were acquired without intravenous contrast. Angiographic
images of the Circle of Willis were acquired using MRA technique
without intravenous contrast.

[Series 5: ax dwi_tracew · axial · 3.0mm · 0.71mm/px · z∈[-95,+67]mm · 8 of 112 slices shown]
[im 1/112]
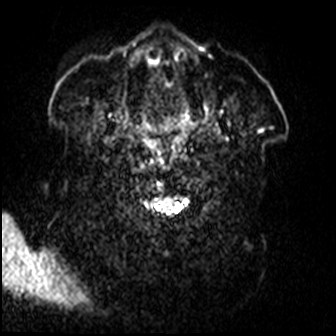
[im 16/112]
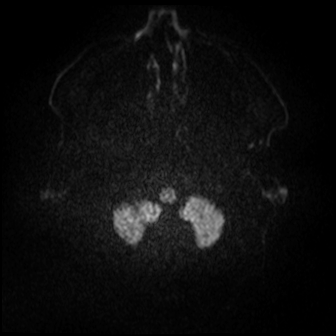
[im 32/112]
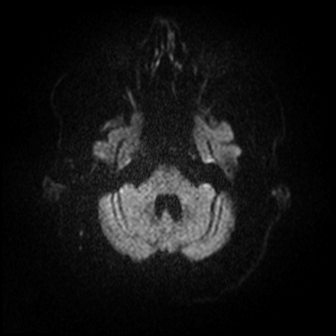
[im 48/112]
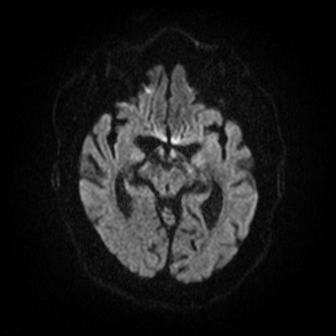
[im 64/112]
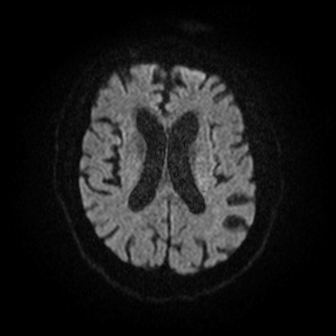
[im 80/112]
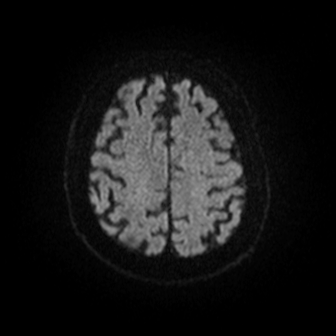
[im 96/112]
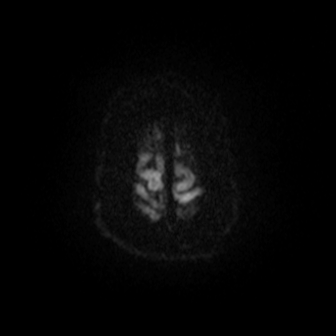
[im 112/112]
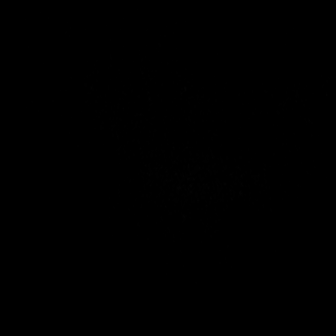

[Series 6: ax dwi_adc · axial · 3.0mm · 0.71mm/px · z∈[-95,+58]mm · 3 of 53 slices shown]
[im 1/53]
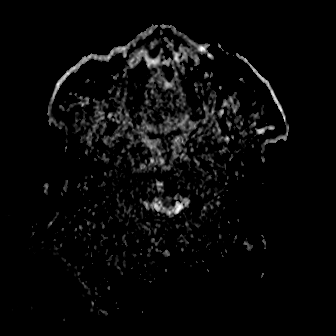
[im 27/53]
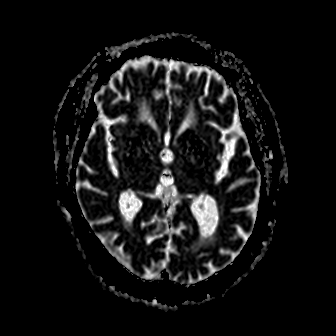
[im 53/53]
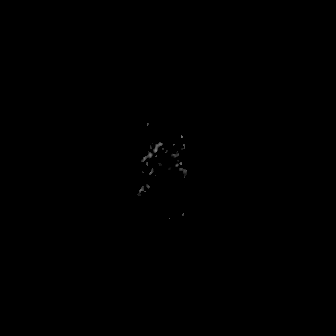

[Series 7: cor dwi_tracew · coronal · 5.0mm · 0.68mm/px · 5 of 80 slices shown]
[im 1/80]
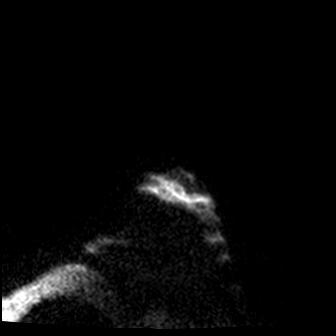
[im 20/80]
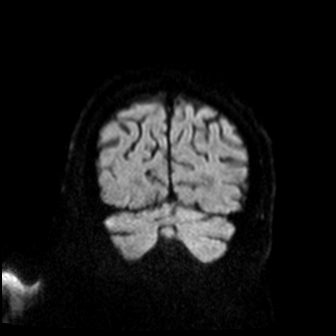
[im 40/80]
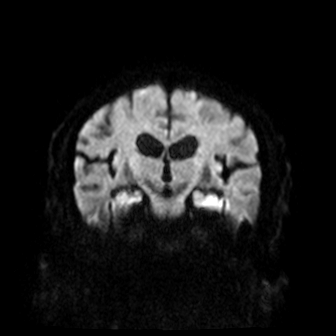
[im 60/80]
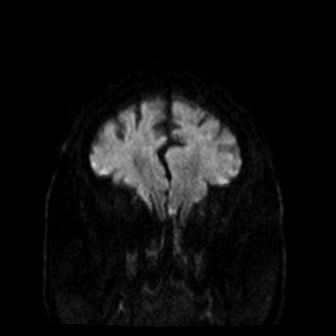
[im 80/80]
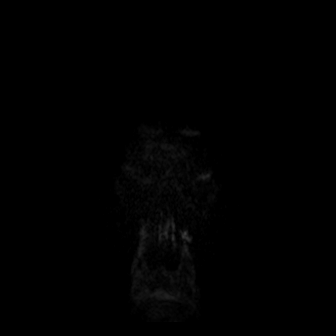

[Series 8: cor dwi_adc · coronal · 5.0mm · 0.68mm/px · 2 of 40 slices shown]
[im 1/40]
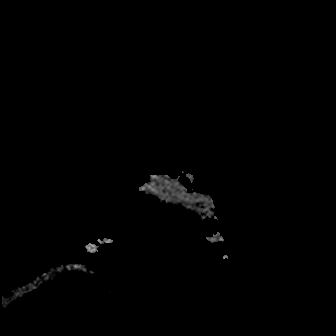
[im 40/40]
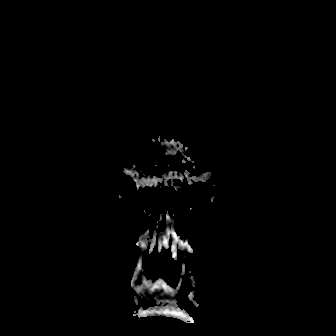

[Series 9: T1 · sagittal · 5.0mm · 0.47mm/px · 1 of 24 slices shown (1 of 2)]
[im 1/24]
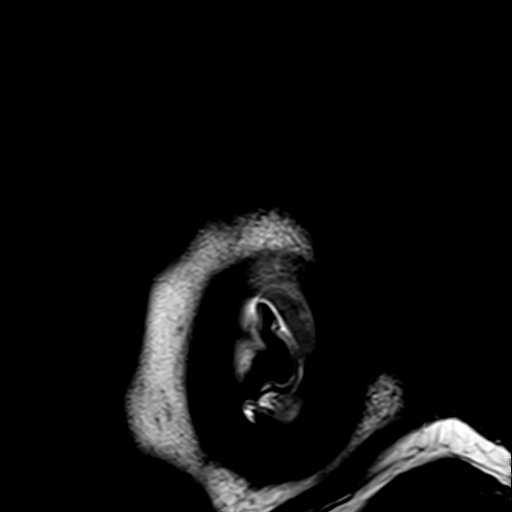

[Series 10: T2 · axial · 5.0mm · 0.86mm/px · z∈[-83,+58]mm · 2 of 25 slices shown (1 of 2)]
[im 1/25]
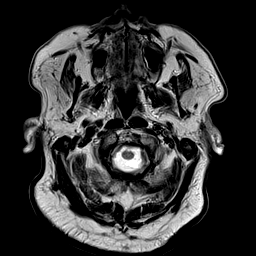
[im 25/25]
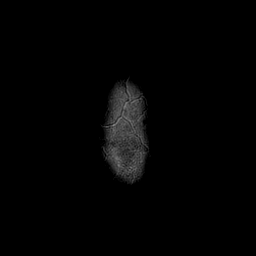

[Series 11: mag_images · axial · 3.0mm · 0.90mm/px · z∈[-86,+64]mm · 3 of 52 slices shown]
[im 1/52]
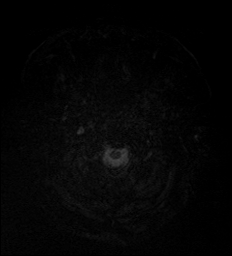
[im 26/52]
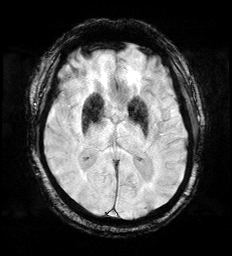
[im 52/52]
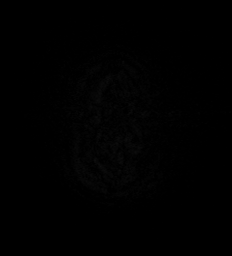

[Series 12: pha_images · axial · 3.0mm · 0.90mm/px · z∈[-86,+64]mm · 3 of 52 slices shown]
[im 1/52]
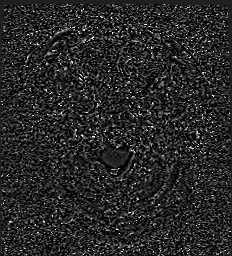
[im 26/52]
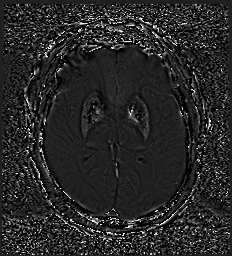
[im 52/52]
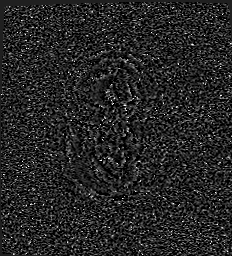

[Series 13: swi_images · axial · 3.0mm · 0.90mm/px · z∈[-86,+64]mm · 3 of 52 slices shown]
[im 1/52]
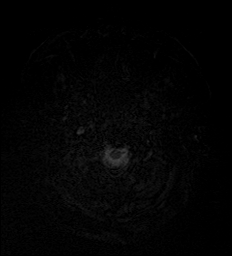
[im 26/52]
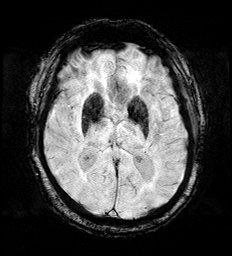
[im 52/52]
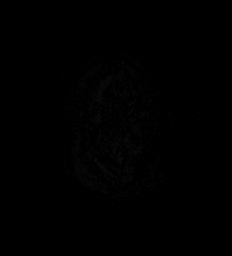

[Series 14: mip_images(sw) · axial · 24.0mm · 0.90mm/px · z∈[-76,+53]mm · 3 of 45 slices shown]
[im 1/45]
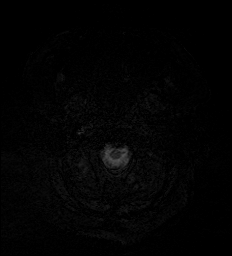
[im 23/45]
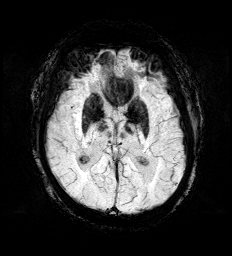
[im 45/45]
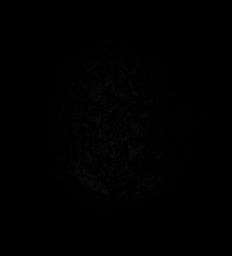

[Series 15: FLAIR · axial · 3.0mm · 0.69mm/px · z∈[-92,+67]mm · 3 of 55 slices shown]
[im 1/55]
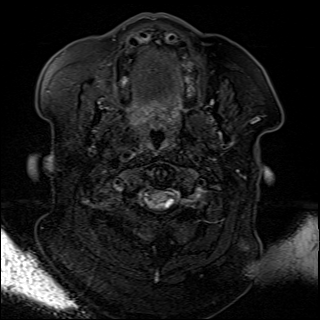
[im 28/55]
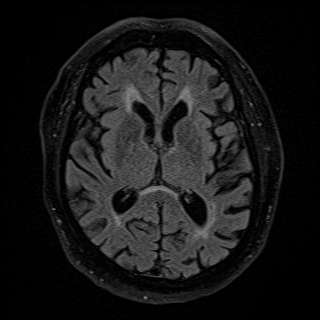
[im 55/55]
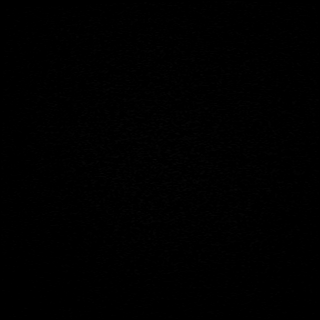

[Series 16: T1 · axial · 1.0mm · 0.98mm/px · z∈[-90,+65]mm · 10 of 160 slices shown (2 of 2)]
[im 1/160]
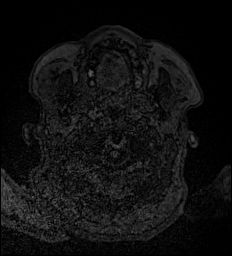
[im 18/160]
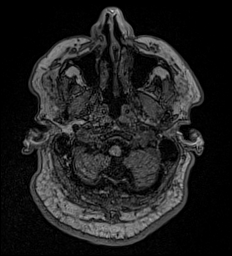
[im 36/160]
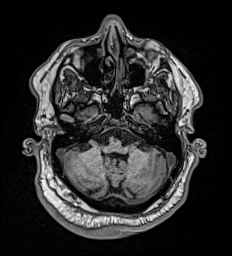
[im 54/160]
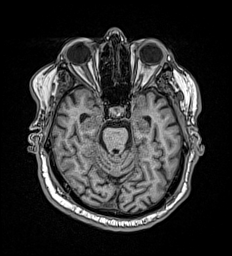
[im 71/160]
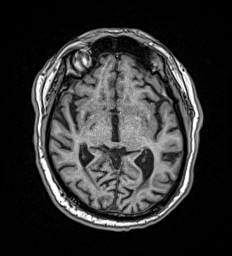
[im 89/160]
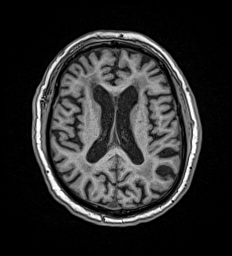
[im 107/160]
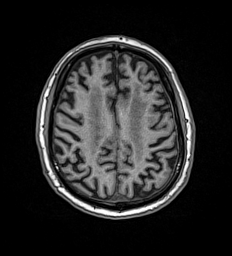
[im 124/160]
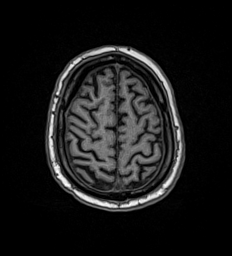
[im 142/160]
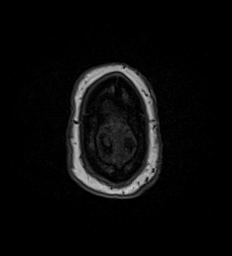
[im 160/160]
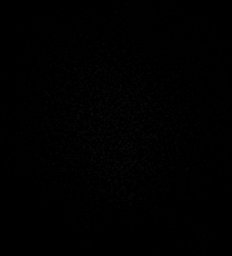

[Series 17: T2 · coronal · 5.0mm · 0.86mm/px · 2 of 30 slices shown (2 of 2)]
[im 1/30]
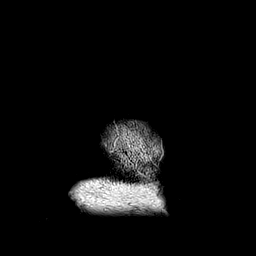
[im 30/30]
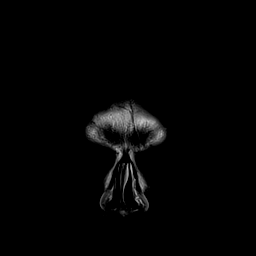

[48 of 48 positions shown; findings below may reference images not displayed]

FINDINGS: MRI HEAD FINDINGS

Brain: No acute infarction, hemorrhage, extra-axial collection or
mass lesion. Moderate patchy and confluent T2/FLAIR hyperintensity
within the white matter, nonspecific but most likely related to
chronic microvascular ischemic disease. Moderate cerebral atrophy
with ventriculomegaly that is most likely ex vacuo in etiology.

Vascular: See below.

Skull and upper cervical spine: Normal marrow signal.

Sinuses/Orbits: Clear sinuses.  Unremarkable orbits.

Other: No sizable mastoid effusions.

MRA HEAD FINDINGS

Anterior circulation: Patent bilateral intracranial internal carotid
arteries, MCAs and ACAs without proximal hemodynamically significant
stenosis. No aneurysm.

Posterior circulation: Patent intradural vertebral arteries, basilar
arteries and posterior cerebral arteries without evidence of
proximal hemodynamically significant stenosis. No visible aneurysm.
IMPRESSION: MRI:

1. No evidence of acute intracranial abnormality.  No acute infarct.
2. Moderate chronic microvascular ischemic disease.
3. Similar ventriculomegaly, which is most likely secondary to
moderate brain atrophy.

MRA:

No large vessel occlusion or proximal hemodynamically significant
stenosis.

## 2021-02-17 IMAGING — CR DG CHEST 2V
2 series · 2 of 2 positions shown · non-contrast
Comparison: Chest radiograph dated [DATE]

CLINICAL DATA: 80-year-old male with weakness and dizziness

EXAM:
CHEST - 2 VIEW

[chest ap]
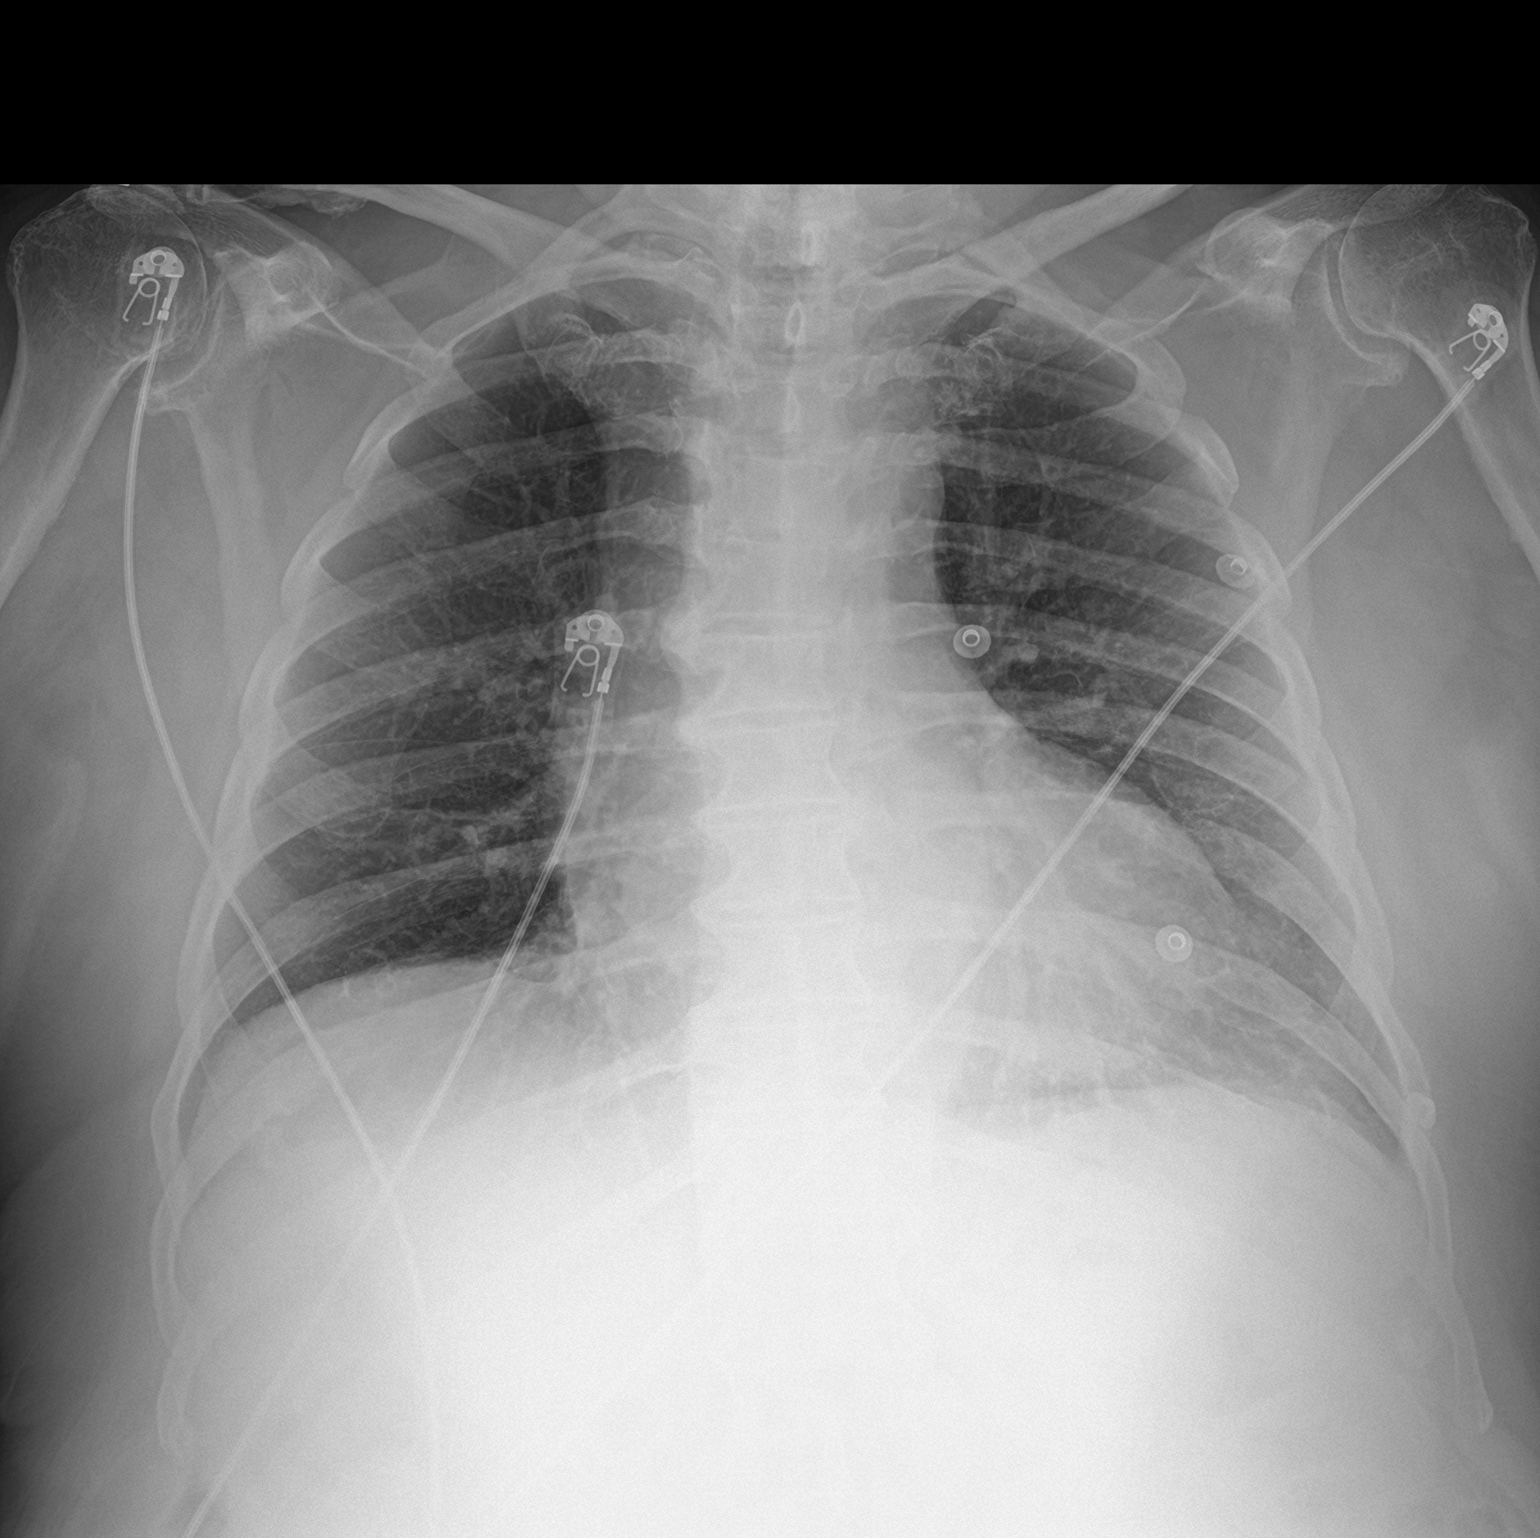

[chest lat]
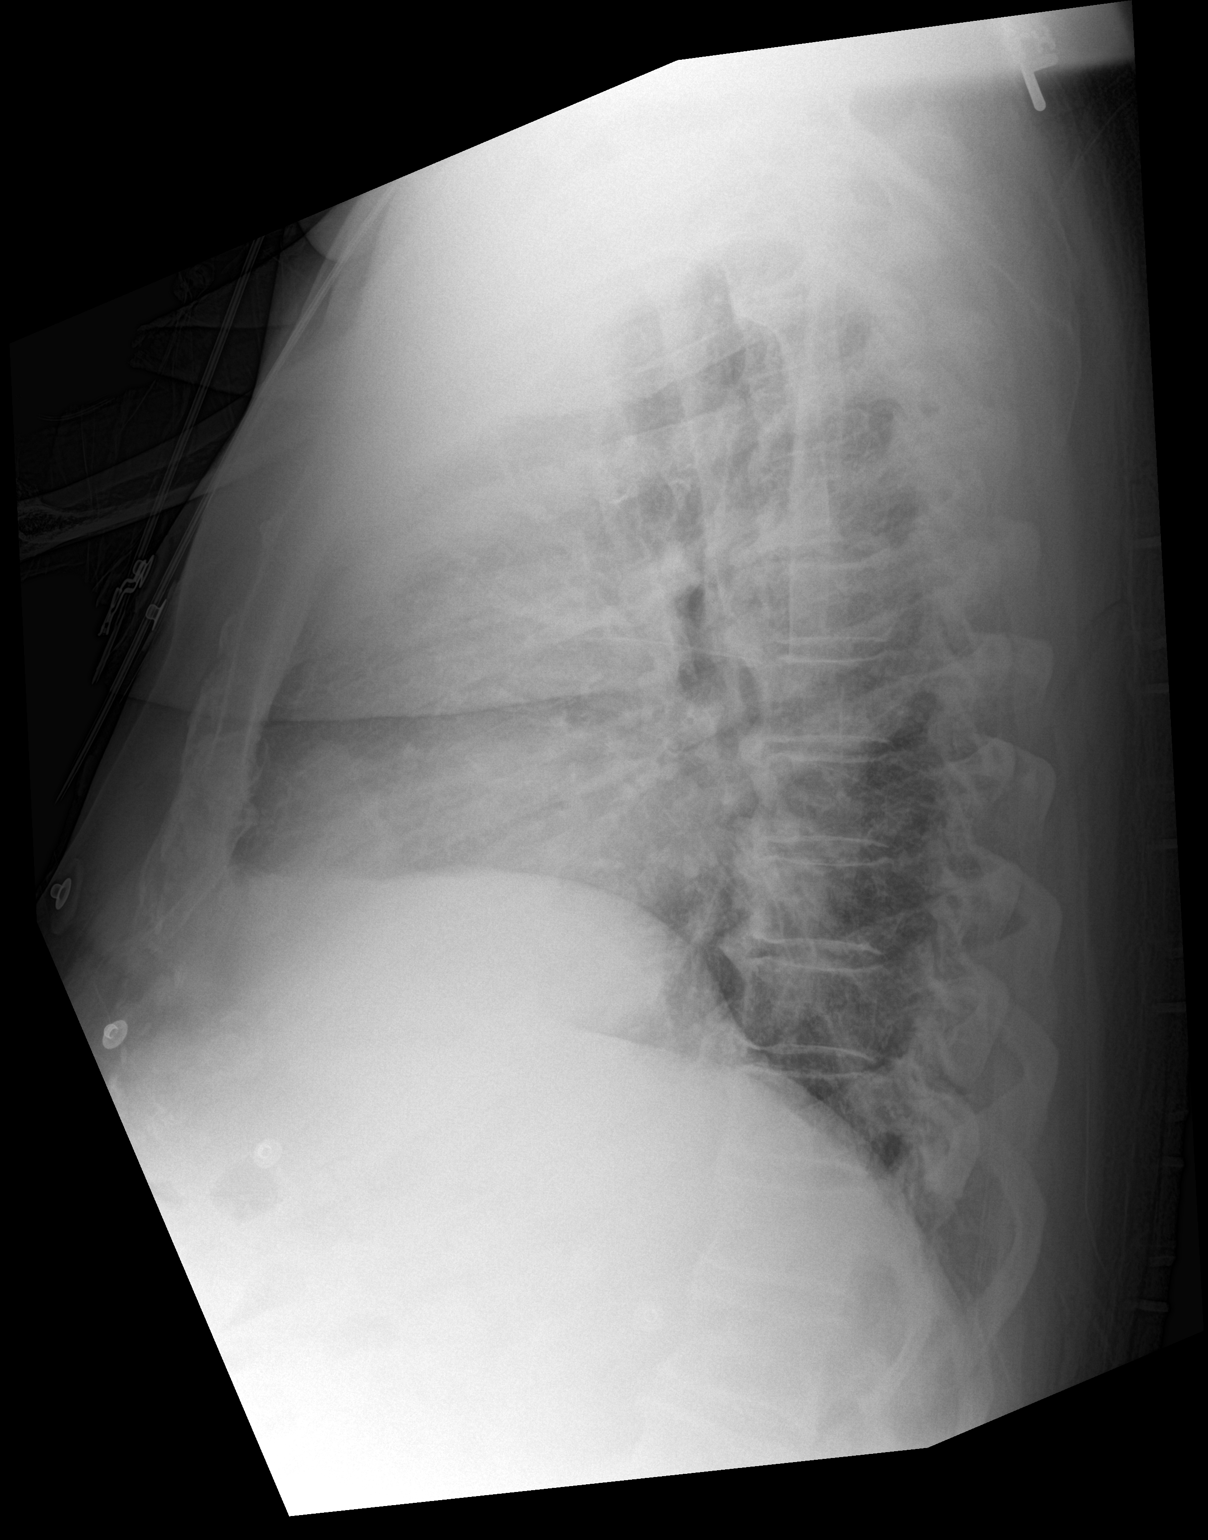

[2 of 2 positions shown; findings below may reference images not displayed]

FINDINGS: No focal consolidation, pleural effusion, or pneumothorax. Mild
cardiomegaly. No acute osseous pathology.
IMPRESSION: No active cardiopulmonary disease.

## 2021-02-17 MED ORDER — ONDANSETRON HCL 4 MG/2ML IJ SOLN: 4.0000 mg | Freq: Four times a day (QID) | INTRAMUSCULAR | Status: DC | PRN

## 2021-02-17 MED ORDER — ACETAMINOPHEN 650 MG RE SUPP: 650.0000 mg | Freq: Four times a day (QID) | RECTAL | Status: DC | PRN

## 2021-02-17 MED ORDER — INSULIN ASPART 100 UNIT/ML IJ SOLN
0.0000 [IU] | Freq: Three times a day (TID) | INTRAMUSCULAR | Status: DC
  Administered 2021-02-18 – 2021-02-20 (×8): 3 [IU] via SUBCUTANEOUS
  Administered 2021-02-21: 5 [IU] via SUBCUTANEOUS
  Filled 2021-02-17 (×8): qty 1

## 2021-02-17 MED ORDER — INSULIN ASPART 100 UNIT/ML IJ SOLN
0.0000 [IU] | Freq: Every day | INTRAMUSCULAR | Status: DC
  Administered 2021-02-19 – 2021-02-20 (×2): 2 [IU] via SUBCUTANEOUS
  Filled 2021-02-17 (×2): qty 1

## 2021-02-17 MED ORDER — APIXABAN 5 MG PO TABS
5.0000 mg | ORAL_TABLET | Freq: Two times a day (BID) | ORAL | Status: DC
  Administered 2021-02-17: 5 mg via ORAL
  Filled 2021-02-17: qty 1

## 2021-02-17 MED ORDER — PRAVASTATIN SODIUM 20 MG PO TABS
20.0000 mg | ORAL_TABLET | Freq: Every day | ORAL | Status: DC
  Administered 2021-02-18 – 2021-02-21 (×3): 20 mg via ORAL
  Filled 2021-02-17 (×5): qty 1

## 2021-02-17 MED ORDER — NITROGLYCERIN 0.4 MG SL SUBL: 0.4000 mg | SUBLINGUAL_TABLET | SUBLINGUAL | Status: DC | PRN

## 2021-02-17 MED ORDER — ACETAMINOPHEN 325 MG PO TABS: 650.0000 mg | ORAL_TABLET | Freq: Four times a day (QID) | ORAL | Status: DC | PRN

## 2021-02-17 MED ORDER — ONDANSETRON HCL 4 MG PO TABS
4.0000 mg | ORAL_TABLET | Freq: Four times a day (QID) | ORAL | Status: DC | PRN
Start: 2021-02-17 — End: 2021-02-21

## 2021-02-17 MED ORDER — SODIUM CHLORIDE 0.9% FLUSH
3.0000 mL | Freq: Two times a day (BID) | INTRAVENOUS | Status: DC
  Administered 2021-02-17 – 2021-02-20 (×6): 3 mL via INTRAVENOUS

## 2021-02-17 NOTE — ED Triage Notes (Signed)
Pt BIB EMS for dizziness. Pt was seen 3 weeks ago for possible dehydration.  Pt was sent with a holter monitor trial and has not gotten results back yet.   Pt states he has fallen several times in last 2 months.

## 2021-02-17 NOTE — H&P (Signed)
History and Physical    Harry [name] VZC:[id number] DOB: 1941/08/26 DOA: 02/17/2021  PCP: Kandyce Rud, MD   Patient coming from: Home  I have personally briefly reviewed patient's old medical records in [facility]  Chief Complaint: Dizzy spells  HPI: Harry [name] is a 80 y.o. male with medical history significant for DM 2, paroxysmal A. fib on Eliquis, HTN, CAD, mild aortic stenosis, presenting with a 3-week history of episodic dizziness and lightheadedness without precipitating or aggravating factors.  Was evaluated in the ER and hydrated but symptoms continued.  Had a Holter monitor for 2 weeks but interpretation still pending.  On the day of arrival, he was awoken by an episode of feeling swimmy in the head and presented to the emergency room for evaluation.  He denies associated chest pain, shortness of breath or palpitations.  He has had no cough fever or chills, no nausea, vomiting, abdominal pain or change in bowel habits and no dysuria.  Denies visual disturbance, one-sided numbness weakness or tingling. ED course: On arrival he was afebrile, BP 176/61 pulse 40 with O2 sat 95% on room air.  Blood work revealed leukocytosis of 12,500 and mildly abnormal UA with moderate leukocytes but otherwise unremarkable.  Troponin 9-12, BNP 36.7, TSH 3.51, free T4 0.59. EKG, personally viewed and interpreted: Sinus rhythm with first-degree AV block, rate 70 with nonspecific ST-T wave changes Imaging: MRI/MRI brain no evidence of acute infarct or large vessel occlusion or stenosis Chest x-ray no acute disease  While in the ER patient had another couple dizzy spells with monitor showing sinus pause of 2 to 3 seconds followed by 60 seconds.  Of bradycardia to the mid 30s to 40s with resumption to a rate of about 70s The ED provider spoke with cardiologist, Dr. Darrold Junker who recommended discontinuation of rate control agents and observation.  No need for pacer at this time.  Hospitalist  consulted for admission.  Review of Systems: As per HPI otherwise all other systems on review of systems negative.    Past Medical History:  Diagnosis Date  . Aortic stenosis   . CAD (coronary artery disease)   . Diabetes (HCC)   . HTN (hypertension)   . PAF (paroxysmal atrial fibrillation) (HCC)     History reviewed. No pertinent surgical history.   reports that he has never smoked. He has never used smokeless tobacco. He reports that he does not drink alcohol and does not use drugs.  No Known Allergies  History reviewed. No pertinent family history.    Prior to Admission medications   Medication Sig Start Date End Date Taking? Authorizing Provider  albuterol (PROVENTIL HFA;VENTOLIN HFA) 108 (90 Base) MCG/ACT inhaler Inhale 2 puffs into the lungs every 6 (six) hours as needed for wheezing or shortness of breath. 09/03/16   Schaevitz, Myra Rude, MD  apixaban (ELIQUIS) 5 MG TABS tablet Take 1 tablet by mouth 2 (two) times daily. 01/14/21   [provider]  cyanocobalamin (,VITAMIN B-12,) 1000 MCG/ML injection INJECT IM EVERY 2 WEEKS AS DIRECTED. 04/20/19   [provider]  diltiazem (CARDIZEM CD) 240 MG 24 hr capsule Take 240 mg by mouth daily. 12/24/20   [provider]  insulin NPH-regular Human (70-30) 100 UNIT/ML injection Inject into the skin.    [provider]  lisinopril (ZESTRIL) 5 MG tablet Take 1 tablet by mouth daily. 06/29/20   [provider]  metFORMIN (GLUCOPHAGE) 1000 MG tablet TAKE 1 (ONE) TABLET TWICE DAILY AS  DIRECTED 04/20/20   [provider]  metoprolol succinate (TOPROL-XL) 50 MG 24 hr tablet Take 1 tablet by mouth daily. 07/02/20   [provider]  nitroGLYCERIN (NITROSTAT) 0.4 MG SL tablet Place under the tongue. 11/27/17   [provider]  pravastatin (PRAVACHOL) 20 MG tablet Take 1 tablet by mouth daily. 06/29/20   [provider]  TRULICITY 1.5 MG/0.5ML SOPN SMARTSIG:0.5  Milliliter(s) SUB-Q Once a Week 10/01/20   [provider]    Physical Exam: Vitals:   02/17/21 1830 02/17/21 1900 02/17/21 1930 02/17/21 2000  BP: (!) 143/64 (!) 149/62 (!) 151/61 (!) 146/57  Pulse: 69 70 72 72  Resp: 12 15 14 17   Temp:      TempSrc:      SpO2: 95% 94% 95% 93%  Weight:      Height:         Vitals:   02/17/21 1830 02/17/21 1900 02/17/21 1930 02/17/21 2000  BP: (!) 143/64 (!) 149/62 (!) 151/61 (!) 146/57  Pulse: 69 70 72 72  Resp: 12 15 14 17   Temp:      TempSrc:      SpO2: 95% 94% 95% 93%  Weight:      Height:          Constitutional: Alert and oriented x 3 . Not in any apparent distress HEENT:      Head: Normocephalic and atraumatic.         Eyes: PERLA, EOMI, Conjunctivae are normal. Sclera is non-icteric.       Mouth/Throat: Mucous membranes are moist.       Neck: Supple with no signs of meningismus. Cardiovascular: Regular rate and rhythm. No murmurs, gallops, or rubs. 2+ symmetrical distal pulses are present . No JVD. No LE edema Respiratory: Respiratory effort normal .Lungs sounds clear bilaterally. No wheezes, crackles, or rhonchi.  Gastrointestinal: Soft, non tender, and non distended with positive bowel sounds.  Genitourinary: No CVA tenderness. Musculoskeletal: Nontender with normal range of motion in all extremities. No cyanosis, or erythema of extremities. Neurologic:  Face is symmetric. Moving all extremities. No gross focal neurologic deficits . Skin: Skin is warm, dry.  No rash or ulcers Psychiatric: Mood and affect are normal    Labs on Admission: I have personally reviewed following labs and imaging studies  CBC: Recent Labs  Lab 02/17/21 1551  WBC 12.5*  NEUTROABS 7.5  HGB 11.4*  HCT 34.6*  MCV 80.8  PLT 276   Basic Metabolic Panel: Recent Labs  Lab 02/17/21 1551  NA 135  K 5.1  CL 102  CO2 21*  GLUCOSE 180*  BUN 17  CREATININE 1.00  CALCIUM 8.7*   GFR: Estimated Creatinine Clearance: 69.5 mL/min (by  C-G formula based on SCr of 1 mg/dL). Liver Function Tests: Recent Labs  Lab 02/17/21 1551  AST 26  ALT 18  ALKPHOS 63  BILITOT 0.7  PROT 6.9  ALBUMIN 3.5   No results for input(s): LIPASE, AMYLASE in the last 168 hours. No results for input(s): AMMONIA in the last 168 hours. Coagulation Profile: No results for input(s): INR, PROTIME in the last 168 hours. Cardiac Enzymes: No results for input(s): CKTOTAL, CKMB, CKMBINDEX, TROPONINI in the last 168 hours. BNP (last 3 results) No results for input(s): PROBNP in the last 8760 hours. HbA1C: No results for input(s): HGBA1C in the last 72 hours. CBG: No results for input(s): GLUCAP in the last 168 hours. Lipid Profile: No results for input(s): CHOL, HDL, LDLCALC, TRIG,  CHOLHDL, LDLDIRECT in the last 72 hours. Thyroid Function Tests: Recent Labs    02/17/21 1551  TSH 3.519  FREET4 0.59*   Anemia Panel: No results for input(s): VITAMINB12, FOLATE, FERRITIN, TIBC, IRON, RETICCTPCT in the last 72 hours. Urine analysis:    Component Value Date/Time   COLORURINE YELLOW (A) 02/17/2021 1541   APPEARANCEUR HAZY (A) 02/17/2021 1541   APPEARANCEUR Clear 01/29/2012 1600   LABSPEC 1.014 02/17/2021 1541   LABSPEC 1.010 01/29/2012 1600   PHURINE 5.0 02/17/2021 1541   GLUCOSEU 50 (A) 02/17/2021 1541   GLUCOSEU 50 mg/dL 59/56/3875 6433   HGBUR NEGATIVE 02/17/2021 1541   BILIRUBINUR NEGATIVE 02/17/2021 1541   BILIRUBINUR Negative 01/29/2012 1600   KETONESUR 5 (A) 02/17/2021 1541   PROTEINUR 30 (A) 02/17/2021 1541   NITRITE NEGATIVE 02/17/2021 1541   LEUKOCYTESUR MODERATE (A) 02/17/2021 1541   LEUKOCYTESUR Negative 01/29/2012 1600    Radiological Exams on Admission: DG Chest 2 View  Result Date: 02/17/2021 CLINICAL DATA:  80 year old male with weakness and dizziness EXAM: CHEST - 2 VIEW COMPARISON:  Chest radiograph dated 01/21/2021 FINDINGS: No focal consolidation, pleural effusion, or pneumothorax. Mild cardiomegaly. No acute  osseous pathology. IMPRESSION: No active cardiopulmonary disease. Electronically Signed   By: Elgie Collard M.D.   On: 02/17/2021 16:29   MR ANGIO HEAD WO CONTRAST  Result Date: 02/17/2021 CLINICAL DATA:  Dizziness. EXAM: MRI HEAD WITHOUT CONTRAST MRA HEAD WITHOUT CONTRAST TECHNIQUE: Multiplanar, multi-echo pulse sequences of the brain and surrounding structures were acquired without intravenous contrast. Angiographic images of the Circle of Willis were acquired using MRA technique without intravenous contrast. COMPARISON: No pertinent prior exam. COMPARISON:  No pertinent prior exam. FINDINGS: MRI HEAD FINDINGS Brain: No acute infarction, hemorrhage, extra-axial collection or mass lesion. Moderate patchy and confluent T2/FLAIR hyperintensity within the white matter, nonspecific but most likely related to chronic microvascular ischemic disease. Moderate cerebral atrophy with ventriculomegaly that is most likely ex vacuo in etiology. Vascular: See below. Skull and upper cervical spine: Normal marrow signal. Sinuses/Orbits: Clear sinuses.  Unremarkable orbits. Other: No sizable mastoid effusions. MRA HEAD FINDINGS Anterior circulation: Patent bilateral intracranial internal carotid arteries, MCAs and ACAs without proximal hemodynamically significant stenosis. No aneurysm. Posterior circulation: Patent intradural vertebral arteries, basilar arteries and posterior cerebral arteries without evidence of proximal hemodynamically significant stenosis. No visible aneurysm. IMPRESSION: MRI: 1. No evidence of acute intracranial abnormality.  No acute infarct. 2. Moderate chronic microvascular ischemic disease. 3. Similar ventriculomegaly, which is most likely secondary to moderate brain atrophy. MRA: No large vessel occlusion or proximal hemodynamically significant stenosis. Electronically Signed   By: Feliberto Harts MD   On: 02/17/2021 18:24   MR BRAIN WO CONTRAST  Result Date: 02/17/2021 CLINICAL DATA:   Dizziness. EXAM: MRI HEAD WITHOUT CONTRAST MRA HEAD WITHOUT CONTRAST TECHNIQUE: Multiplanar, multi-echo pulse sequences of the brain and surrounding structures were acquired without intravenous contrast. Angiographic images of the Circle of Willis were acquired using MRA technique without intravenous contrast. COMPARISON: No pertinent prior exam. COMPARISON:  No pertinent prior exam. FINDINGS: MRI HEAD FINDINGS Brain: No acute infarction, hemorrhage, extra-axial collection or mass lesion. Moderate patchy and confluent T2/FLAIR hyperintensity within the white matter, nonspecific but most likely related to chronic microvascular ischemic disease. Moderate cerebral atrophy with ventriculomegaly that is most likely ex vacuo in etiology. Vascular: See below. Skull and upper cervical spine: Normal marrow signal. Sinuses/Orbits: Clear sinuses.  Unremarkable orbits. Other: No sizable mastoid effusions. MRA HEAD FINDINGS Anterior circulation: Patent bilateral intracranial internal carotid  arteries, MCAs and ACAs without proximal hemodynamically significant stenosis. No aneurysm. Posterior circulation: Patent intradural vertebral arteries, basilar arteries and posterior cerebral arteries without evidence of proximal hemodynamically significant stenosis. No visible aneurysm. IMPRESSION: MRI: 1. No evidence of acute intracranial abnormality.  No acute infarct. 2. Moderate chronic microvascular ischemic disease. 3. Similar ventriculomegaly, which is most likely secondary to moderate brain atrophy. MRA: No large vessel occlusion or proximal hemodynamically significant stenosis. Electronically Signed   By: Feliberto Harts MD   On: 02/17/2021 18:24     Assessment/Plan 80 year old male with history of DM 2, paroxysmal A. fib on Eliquis, HTN, CAD, mild aortic stenosis, presenting with a 3-week history of episodic dizziness and lightheadedness.    Symptomatic sinus bradycardia   Postural dizziness with presyncope   Sinus  pause - Patient with dizzy spells, occurring during work-up in the ED, correlating with sinus pause of 2 to 3 seconds and bradycardia in the 30s to 40s - Troponin 9-12, BNP 36.4, TSH 3.5, EKG nonacute - Per conversation with Dr. Carrolyn Meiers provider, likely related to medication - Hold home diltiazem 240 and Toprol 50 - Continuous cardiac monitoring  Abnormal UA with leukocytosis - Abnormal UA, WBC 12,000 but no fever or complaints of dysuria - Follow urine culture and start ABX if needed    Aortic stenosis, mild - No acute disease suspected at this time    Coronary artery disease involving native coronary artery of native heart without angina pectoris - Patient denies chest pain, EKG nonacute and troponin negative x2 - Continue home pravastatin and nitroglycerin.  Metoprolol being held due to bradycardia    DMII (diabetes mellitus, type 2) (HCC) - Sliding scale insulin coverage    Essential hypertension - Continue lisinopril    Obstructive sleep apnea - CPAP if desired    Paroxysmal atrial fibrillation (HCC)   Chronic anticoagulation - Continue Eliquis - Rate control agents being held due to symptomatic bradycardia    DVT prophylaxis: Eliquis Code Status: full code  Family Communication:  none  Disposition Plan: Back to previous home environment Consults called: Cardiology Status: Observation    Andris Baumann MD [facility]     02/17/2021, 8:14 PM

## 2021-02-17 NOTE — ED Notes (Signed)
Pt's wife requests to be notified when pt receives inpatient bed assignment, her correct phone number is listed in patient's chart and the pt gives verbal consent to share medical information with her.

## 2021-02-17 NOTE — ED Provider Notes (Signed)
[facility] Emergency Department Provider Note  ____________________________________________  Time seen: Approximately 3:50 PM  I have reviewed the triage vital signs and the nursing notes.   HISTORY  Chief Complaint Dizziness    HPI Harry [name] is a 80 y.o. male who presents the emergency department via EMS for complaint of dizziness, weakness.  Patient states that roughly 3 weeks ago he developed sudden onset dizziness.  This is not persistent all the time but he will have "spells" where the dizziness becomes severe, he becomes very weak.  He states that he has had a true syncopal episode several times with this, has had near syncope multiple times and has fallen from the symptoms.  Patient was assessed at the onset of the symptoms and had what appeared to be relatively reassuring work-up.  Determined the patient was likely dehydrated and was asymptomatic at time of discharge.  He has followed up with his endocrinologist and cardiologist without any further diagnosis or explanation of the symptoms.  He states that the symptoms are becoming more frequent, more severe.  Today he was asleep, states that he had this "sensation" in his head that woke him up.  He denied it being pain but states that it was a "weird sensation" that did arouse him from sleep.  He has had no visual changes, no difficulty formulating thoughts or words, no unilateral weakness.  Does have cardiac history but denies any chest pain, shortness of breath.  No abdominal complaints.  Patient has had no new medications or medication changes.  He is not eating more or less.  No increase or decrease in his normal activity.  Patient has a history of BPH, degenerative disc disease, aortic stenosis, sleep apnea, paroxysmal A. fib, coronary artery disease, hypertension, diabetes.         Past Medical History:  Diagnosis Date  . Aortic stenosis   . CAD (coronary artery disease)   . Diabetes (HCC)   .  HTN (hypertension)   . PAF (paroxysmal atrial fibrillation) [facility])     Patient Active Problem List   Diagnosis Date Noted  . Symptomatic sinus bradycardia 02/17/2021  . Postural dizziness with presyncope 02/17/2021  . Sinus pause 02/17/2021  . Chronic anticoagulation 02/17/2021  . BPH (benign prostatic hyperplasia) 01/21/2021  . DDD (degenerative disc disease) 01/21/2021  . Hypertensive retinopathy of both eyes 10/24/2018  . Aortic stenosis, mild 07/06/2018  . 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 Systolic murmur 07/06/2017  . Obstructive sleep apnea 03/05/2017  . Paroxysmal atrial fibrillation (HCC) 03/05/2017  . Coronary artery disease involving native coronary artery of native heart without angina pectoris 06/13/2014  . Erectile dysfunction 06/13/2014  . Essential hypertension 06/13/2014  . Deficiency of other specified B group vitamins 04/24/2014  . Hypertriglyceridemia 04/24/2014  . Obesity 04/24/2014  . Family history of colon cancer 03/06/2014  . DMII (diabetes mellitus, type 2) (HCC) 05/01/2012  . Pseudophakia, both eyes 05/01/2012    History reviewed. No pertinent surgical history.  Prior to Admission medications   Medication Sig Start Date End Date Taking? Authorizing Provider  albuterol (PROVENTIL HFA;VENTOLIN HFA) 108 (90 Base) MCG/ACT inhaler Inhale 2 puffs into the lungs every 6 (six) hours as needed for wheezing or shortness of breath. 09/03/16   Schaevitz, Myra Rude, MD  apixaban (ELIQUIS) 5 MG TABS tablet Take 1 tablet by mouth 2 (two) times daily. 01/14/21   [provider]  cyanocobalamin (,VITAMIN B-12,) 1000 MCG/ML injection INJECT IM EVERY 2 WEEKS AS DIRECTED. 04/20/19   [provider]  diltiazem (CARDIZEM CD) 240 MG 24 hr capsule Take 240 mg by mouth daily. 12/24/20   [provider]  insulin NPH-regular Human (70-30) 100 UNIT/ML  injection Inject into the skin.    [provider]  lisinopril (ZESTRIL) 5 MG tablet Take 1 tablet by mouth daily. 06/29/20   [provider]  metFORMIN (GLUCOPHAGE) 1000 MG tablet TAKE 1 (ONE) TABLET TWICE DAILY AS DIRECTED 04/20/20   [provider]  metoprolol succinate (TOPROL-XL) 50 MG 24 hr tablet Take 1 tablet by mouth daily. 07/02/20   [provider]  nitroGLYCERIN (NITROSTAT) 0.4 MG SL tablet Place under the tongue. 11/27/17   [provider]  pravastatin (PRAVACHOL) 20 MG tablet Take 1 tablet by mouth daily. 06/29/20   [provider]  TRULICITY 1.5 MG/0.5ML SOPN SMARTSIG:0.5 Milliliter(s) SUB-Q Once a Week 10/01/20   [provider]    Allergies Patient has no known allergies.  History reviewed. No pertinent family history.  Social History Social History   Tobacco Use  . Smoking status: Never Smoker  . Smokeless tobacco: Never Used  Substance Use Topics  . Alcohol use: No  . Drug use: No     Review of Systems  Constitutional: No fever/chills Eyes: No visual changes. No discharge ENT: No upper respiratory complaints. Cardiovascular: no chest pain. Respiratory: no cough. No SOB. Gastrointestinal: No abdominal pain.  No nausea, no vomiting.  No diarrhea.  No constipation. Genitourinary: Negative for dysuria. No hematuria Musculoskeletal: Negative for musculoskeletal pain. Skin: Negative for rash, abrasions, lacerations, ecchymosis. Neurological: Positive for abrupt onset of dizziness/lightheadedness.  Patient has had syncope and presyncope from this feeling.  No syncopal episode today.  Negative for headaches, focal weakness or numbness.  10 System ROS otherwise negative.  ____________________________________________   PHYSICAL EXAM:  VITAL SIGNS: ED Triage Vitals [02/17/21 1542]  Enc Vitals Group     BP      Pulse      Resp      Temp      Temp src      SpO2      Weight 226 lb (102.5 kg)      Height  (1.702 m)     Head Circumference      Peak Flow      Pain Score 0     Pain Loc      Pain Edu?      Excl. in GC?      Constitutional: Alert and oriented. Well appearing and in no acute distress. Eyes: Conjunctivae are normal. PERRL. EOMI. Head: Atraumatic. ENT:      Ears:       Nose: No congestion/rhinnorhea.      Mouth/Throat: Mucous membranes are moist.  Neck: No stridor.  Neck is supple full range of motion.  Auscultation does not reveal any significant bruits. Hematological/Lymphatic/Immunilogical: No cervical lymphadenopathy. Cardiovascular: Normal rate, regular rhythm. Normal S1 and S2.  Good peripheral circulation. Respiratory: Normal respiratory effort without tachypnea or retractions. Lungs CTAB. Good air entry to the bases with no decreased or absent breath sounds. Musculoskeletal: Full range of motion to all extremities. No gross deformities appreciated. Neurologic:  Normal speech and language. No gross focal neurologic deficits are appreciated.  Cranial nerves II to XII grossly intact.  Negative Romberg's or pronator drift.  Equal grip strength upper extremities. Skin:  Skin is warm, dry and  intact. No rash noted. Psychiatric: Mood and affect are normal. Speech and behavior are normal. Patient exhibits appropriate insight and judgement.   ____________________________________________   LABS (all labs ordered are listed, but only abnormal results are displayed)  Labs Reviewed  COMPREHENSIVE METABOLIC PANEL - Abnormal; Notable for the following components:      Result Value   CO2 21 (*)    Glucose, Bld 180 (*)    Calcium 8.7 (*)    All other components within normal limits  CBC WITH DIFFERENTIAL/PLATELET - Abnormal; Notable for the following components:   WBC 12.5 (*)    Hemoglobin 11.4 (*)    HCT 34.6 (*)    RDW 17.3 (*)    Monocytes Absolute 1.3 (*)    Eosinophils Absolute 0.7 (*)    All other components within normal limits  URINALYSIS, COMPLETE  (UACMP) WITH MICROSCOPIC - Abnormal; Notable for the following components:   Color, Urine YELLOW (*)    APPearance HAZY (*)    Glucose, UA 50 (*)    Ketones, ur 5 (*)    Protein, ur 30 (*)    Leukocytes,Ua MODERATE (*)    All other components within normal limits  T4, FREE - Abnormal; Notable for the following components:   Free T4 0.59 (*)    All other components within normal limits  SARS CORONAVIRUS 2 (TAT 6-24 HRS)  URINE CULTURE  BRAIN NATRIURETIC PEPTIDE  TSH  T3, FREE  HEMOGLOBIN A1C  TROPONIN I (HIGH SENSITIVITY)  TROPONIN I (HIGH SENSITIVITY)   ____________________________________________  EKG  ED ECG REPORT I, Delorise RoyalsJonathan D Zephyra Bernardi,  personally viewed and interpreted this ECG.   Date: 02/17/2021  EKG Time: 1559 hrs.  Rate: 70 bpm  Rhythm: normal sinus rhythm, RBBB, 1st degree AV block  Axis: Leftward axis.  Intervals:first-degree A-V block  and right bundle branch block  ST&T Change: No ST elevation or depression noted.  Patient and sinus rhythm with first-degree AV block.  Right bundle branch block.  No evidence of STEMI.  Compared to previous EKG from 01/21/2021, largely unchanged.  ____________________________________________  RADIOLOGY I personally viewed and evaluated these images as part of my medical decision making, as well as reviewing the written report by the radiologist.  ED Provider Interpretation: No acute cardiopulmonary findings on chest x-ray.  MRI revealed no acute findings.  DG Chest 2 View  Result Date: 02/17/2021 CLINICAL DATA:  80 year old male with weakness and dizziness EXAM: CHEST - 2 VIEW COMPARISON:  Chest radiograph dated 01/21/2021 FINDINGS: No focal consolidation, pleural effusion, or pneumothorax. Mild cardiomegaly. No acute osseous pathology. IMPRESSION: No active cardiopulmonary disease. Electronically Signed   By: Elgie CollardArash  Radparvar M.D.   On: 02/17/2021 16:29   MR ANGIO HEAD WO CONTRAST  Result Date: 02/17/2021 CLINICAL DATA:   Dizziness. EXAM: MRI HEAD WITHOUT CONTRAST MRA HEAD WITHOUT CONTRAST TECHNIQUE: Multiplanar, multi-echo pulse sequences of the brain and surrounding structures were acquired without intravenous contrast. Angiographic images of the Circle of Willis were acquired using MRA technique without intravenous contrast. COMPARISON: No pertinent prior exam. COMPARISON:  No pertinent prior exam. FINDINGS: MRI HEAD FINDINGS Brain: No acute infarction, hemorrhage, extra-axial collection or mass lesion. Moderate patchy and confluent T2/FLAIR hyperintensity within the white matter, nonspecific but most likely related to chronic microvascular ischemic disease. Moderate cerebral atrophy with ventriculomegaly that is most likely ex vacuo in etiology. Vascular: See below. Skull and upper cervical spine: Normal marrow signal. Sinuses/Orbits: Clear sinuses.  Unremarkable orbits. Other: No sizable mastoid effusions. MRA  HEAD FINDINGS Anterior circulation: Patent bilateral intracranial internal carotid arteries, MCAs and ACAs without proximal hemodynamically significant stenosis. No aneurysm. Posterior circulation: Patent intradural vertebral arteries, basilar arteries and posterior cerebral arteries without evidence of proximal hemodynamically significant stenosis. No visible aneurysm. IMPRESSION: MRI: 1. No evidence of acute intracranial abnormality.  No acute infarct. 2. Moderate chronic microvascular ischemic disease. 3. Similar ventriculomegaly, which is most likely secondary to moderate brain atrophy. MRA: No large vessel occlusion or proximal hemodynamically significant stenosis. Electronically Signed   By: Feliberto Harts MD   On: 02/17/2021 18:24   MR BRAIN WO CONTRAST  Result Date: 02/17/2021 CLINICAL DATA:  Dizziness. EXAM: MRI HEAD WITHOUT CONTRAST MRA HEAD WITHOUT CONTRAST TECHNIQUE: Multiplanar, multi-echo pulse sequences of the brain and surrounding structures were acquired without intravenous contrast. Angiographic  images of the Circle of Willis were acquired using MRA technique without intravenous contrast. COMPARISON: No pertinent prior exam. COMPARISON:  No pertinent prior exam. FINDINGS: MRI HEAD FINDINGS Brain: No acute infarction, hemorrhage, extra-axial collection or mass lesion. Moderate patchy and confluent T2/FLAIR hyperintensity within the white matter, nonspecific but most likely related to chronic microvascular ischemic disease. Moderate cerebral atrophy with ventriculomegaly that is most likely ex vacuo in etiology. Vascular: See below. Skull and upper cervical spine: Normal marrow signal. Sinuses/Orbits: Clear sinuses.  Unremarkable orbits. Other: No sizable mastoid effusions. MRA HEAD FINDINGS Anterior circulation: Patent bilateral intracranial internal carotid arteries, MCAs and ACAs without proximal hemodynamically significant stenosis. No aneurysm. Posterior circulation: Patent intradural vertebral arteries, basilar arteries and posterior cerebral arteries without evidence of proximal hemodynamically significant stenosis. No visible aneurysm. IMPRESSION: MRI: 1. No evidence of acute intracranial abnormality.  No acute infarct. 2. Moderate chronic microvascular ischemic disease. 3. Similar ventriculomegaly, which is most likely secondary to moderate brain atrophy. MRA: No large vessel occlusion or proximal hemodynamically significant stenosis. Electronically Signed   By: Feliberto Harts MD   On: 02/17/2021 18:24    ____________________________________________    PROCEDURES  Procedure(s) performed:    Procedures    Medications  apixaban (ELIQUIS) tablet 5 mg (has no administration in time range)  nitroGLYCERIN (NITROSTAT) SL tablet 0.4 mg (has no administration in time range)  pravastatin (PRAVACHOL) tablet 20 mg (has no administration in time range)  sodium chloride flush (NS) 0.9 % injection 3 mL (has no administration in time range)  insulin aspart (novoLOG) injection 0-15 Units (has  no administration in time range)  insulin aspart (novoLOG) injection 0-5 Units (has no administration in time range)  acetaminophen (TYLENOL) tablet 650 mg (has no administration in time range)    Or  acetaminophen (TYLENOL) suppository 650 mg (has no administration in time range)  ondansetron (ZOFRAN) tablet 4 mg (has no administration in time range)    Or  ondansetron (ZOFRAN) injection 4 mg (has no administration in time range)     ____________________________________________   INITIAL IMPRESSION / ASSESSMENT AND PLAN / ED COURSE  Pertinent labs & imaging results that were available during my care of the patient were reviewed by me and considered in my medical decision making (see chart for details).  Review of the [place] CSRS was performed in accordance of the [facility] prior to dispensing any controlled drugs.  Clinical Course as of 02/17/21 2051  Wynelle Link Feb 17, 2021  1606 While patient was in the emergency department he had a return of symptoms where he had the lightheaded/dizzy sensation.  Patient was hooked up to the cardiac monitor at this time, patient had a sinus pause  followed by a period of bradycardia.  Patient's heart rate returned back up into the 70s without intervention and the sensation had resolved with resumption of normal sinus rhythm.  Patient had sinus bradycardia without any other evidence of acute changes on his 3-lead.  Prior to being able to obtain a twelve-lead patient had become asymptomatic and rhythm was back to the patient's visualized rhythm on arrival  [JC]    Clinical Course User Index [JC] Ell Tiso, Delorise Royals, PA-C          Patient's diagnosis is consistent with dizziness, near syncope, sinus pause.  Patient presented to the emergency department with increasing frequency of dizziness.  Patient has had syncopal episodes as well as near syncopal episodes from this reported lightheaded/dizzy sensation.  He denies a spinning or room spinning sensation but states  that when these occur he feels lightheaded and like he was going to fall.  He has passed out several times, has fallen several times from these episodes.  Patient was evaluated in this department roughly 3 weeks ago, had a reassuring work-up at that time and was referred back to his endocrinologist and cardiologist.  He has been evaluated at both of these specialist, has just completed wearing a Holter monitor for 2 weeks and results have not returned.  Patient had increasing symptoms today presented to the emergency department.  Initially exam, labs were appearing reassuring.  Given the increased sensations of dizziness, lightheadedness, near-syncope and syncope patient had MRI of the brain which was also reassuring.  While the patient was awaiting results he had a sensation of the lightheaded and dizziness and it was noted on the monitor that he had a 2 to 3-second sinus pause followed by a period of bradycardia for roughly a minute.  Patient is returned back to his baseline rate but had 1 more episode which was also noted to follow the same pattern of sinus pause with bradycardia.  I discussed the patient with cardiology.  Patient's EKG is revealing first-degree AV block with no evidence of STEMI.  Troponins were reassuring.  There is no indication for acute placement of transcutaneous pacing wires or external pacing.  Cardiology's recommendation is to stop the metoprolol, observe the patient.  Patient comes more symptomatic or has other concerning findings develop can reconsider treatment, otherwise the patient is improving symptomatically he may follow-up outpatient with cardiology for further evaluation..  I will consult the hospitalist service at this time for admission.   ____________________________________________  FINAL CLINICAL IMPRESSION(S) / ED DIAGNOSES  Final diagnoses:  Sinus pause  Dizziness  Near syncope      NEW MEDICATIONS STARTED DURING THIS VISIT:  ED Discharge Orders    None           This chart was dictated using voice recognition software/Dragon. Despite best efforts to proofread, errors can occur which can change the meaning. Any change was purely unintentional.    Racheal Patches, PA-C 02/17/21 2051    Gilles Chiquito, MD 02/17/21 2155

## 2021-02-17 NOTE — ED Notes (Signed)
Pt alarming for bradycardia. HR 38-42 for at least 1 minute. Pt states he felt symptoms he presented with during this episode. PA John at bedside. HR now back in 70's and pt has no current complaints. BP stable.

## 2021-02-18 ENCOUNTER — Other Ambulatory Visit: Payer: Self-pay

## 2021-02-18 DIAGNOSIS — R001 Bradycardia, unspecified: Secondary | ICD-10-CM | POA: Diagnosis not present

## 2021-02-18 LAB — GLUCOSE, CAPILLARY
Glucose-Capillary: 166 mg/dL — ABNORMAL HIGH (ref 70–99)
Glucose-Capillary: 183 mg/dL — ABNORMAL HIGH (ref 70–99)
Glucose-Capillary: 178 mg/dL — ABNORMAL HIGH (ref 70–99)

## 2021-02-18 LAB — CBG MONITORING, ED: Glucose-Capillary: 158 mg/dL — ABNORMAL HIGH (ref 70–99)

## 2021-02-18 LAB — SARS CORONAVIRUS 2 (TAT 6-24 HRS): SARS Coronavirus 2: NEGATIVE

## 2021-02-18 MED ORDER — ZOLPIDEM TARTRATE 5 MG PO TABS
5.0000 mg | ORAL_TABLET | Freq: Every evening | ORAL | Status: DC | PRN
  Filled 2021-02-18: qty 1

## 2021-02-18 MED ORDER — ASPIRIN 81 MG PO CHEW: 81.0000 mg | CHEWABLE_TABLET | Freq: Every day | ORAL | Status: DC

## 2021-02-18 MED ORDER — LISINOPRIL 5 MG PO TABS
5.0000 mg | ORAL_TABLET | Freq: Every day | ORAL | Status: DC
  Administered 2021-02-18 – 2021-02-21 (×3): 5 mg via ORAL
  Filled 2021-02-18 (×3): qty 1

## 2021-02-18 MED ORDER — INSULIN GLARGINE 100 UNIT/ML ~~LOC~~ SOLN
30.0000 [IU] | Freq: Every day | SUBCUTANEOUS | Status: DC
  Administered 2021-02-18 – 2021-02-20 (×3): 30 [IU] via SUBCUTANEOUS
  Filled 2021-02-18 (×4): qty 0.3

## 2021-02-18 MED ORDER — ASPIRIN 81 MG PO CHEW
81.0000 mg | CHEWABLE_TABLET | Freq: Every day | ORAL | Status: DC
  Administered 2021-02-18 – 2021-02-21 (×3): 81 mg via ORAL
  Filled 2021-02-18 (×3): qty 1

## 2021-02-18 NOTE — ED Notes (Signed)
Cardiology to bedside. 

## 2021-02-18 NOTE — Progress Notes (Signed)
PROGRESS NOTE    Harry [name]  GHW:[id number] DOB: 1941-04-04 DOA: 02/17/2021 PCP: Kandyce Rud, MD  Outpatient Specialists: [facility]    Brief Narrative:   Harry [name] is a 80 y.o. male with medical history significant for DM 2, paroxysmal A. fib on Eliquis, HTN, CAD, mild aortic stenosis, presenting with a 3-week history of episodic dizziness and lightheadedness without precipitating or aggravating factors.  Was evaluated in the ER and hydrated but symptoms continued.  Had a Holter monitor for 2 weeks but interpretation still pending.  On the day of arrival, he was awoken by an episode of feeling swimmy in the head and presented to the emergency room for evaluation.  He denies associated chest pain, shortness of breath or palpitations.  He has had no cough fever or chills, no nausea, vomiting, abdominal pain or change in bowel habits and no dysuria.  Denies visual disturbance, one-sided numbness weakness or tingling.    Assessment & Plan:   Principal Problem:   Symptomatic sinus bradycardia Active Problems:   Aortic stenosis, mild   Coronary artery disease involving native coronary artery of native heart without angina pectoris   DMII (diabetes mellitus, type 2) (HCC)   Essential hypertension   Obstructive sleep apnea   Paroxysmal atrial fibrillation (HCC)   Postural dizziness with presyncope   Sinus pause   Chronic anticoagulation     Symptomatic sinus bradycardia   Postural dizziness with presyncope   Sinus pause Patient with dizzy spells, occurring during work-up in the ED, correlating with sinus pause of 2 to 3 seconds and bradycardia in the 30s to 40s. Troponin 9-12, BNP 36.4, TSH 3.5, EKG nonacute, does show 1st degree av block. Mri/mra non-acute. Recent 14 day holter, results not yet available. 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  Metoprolol being held due to bradycardia - cont eliquis    DMII (diabetes mellitus, type 2) (HCC) - Sliding scale insulin coverage - start lantus 30 for home 70/30    Essential hypertension - Continue lisinopril    Obstructive sleep apnea - CPAP qhs    Paroxysmal atrial fibrillation (HCC)   Chronic anticoagulation - Continue Eliquis - Rate control agents being held due to symptomatic bradycardia   DVT prophylaxis: eliquis Code Status: full Family Communication: none @ bedside  Level of care: Progressive Cardiac Status is: Observation  The patient remains OBS appropriate and will d/c before 2 midnights.  Dispo: The patient is from: Home              Anticipated d/c is to: Home              Patient currently is not medically stable to d/c.   Difficult to place patient No        Consultants:  cardiology  Procedures: none  Antimicrobials:  none    Subjective: This morning feeling well. Had couple brief episodes lightheadedness overnight. No chest pain or palpitations.  Objective: Vitals:   02/18/21 0219 02/18/21 0225 02/18/21 0300 02/18/21 0500  BP:   (!) 155/68 (!) 157/73  Pulse: 66 73 72 72  Resp: 19 18 16 10   Temp:  TempSrc:      SpO2: (!) 89% 95% 94% 98%  Weight:      Height:        Intake/Output Summary (Last 24 hours) at 02/18/2021 0738 Last data filed at 02/18/2021 0500 Gross per 24 hour  Intake --  Output 1900 ml  Net -1900 ml   Filed Weights   02/17/21 1542  Weight: 102.5 kg    Examination:  General exam: Appears calm and comfortable  Respiratory system: Clear to auscultation. Respiratory effort normal. Cardiovascular system: S1 & S2 heard, RRR. Soft systolic murmur.  No edema Gastrointestinal system: Abdomen is obese, soft and nontender. No organomegaly or masses felt. Normal bowel sounds heard. Central nervous system: Alert and oriented. No focal neurological deficits. Extremities: Symmetric 5 x 5 power. Skin: No rashes, lesions or ulcers Psychiatry: Judgement and insight appear normal. Mood & affect appropriate.     Data Reviewed: I have personally reviewed following labs and imaging studies  CBC: Recent Labs  Lab 02/17/21 1551  WBC 12.5*  NEUTROABS 7.5  HGB 11.4*  HCT 34.6*  MCV 80.8  PLT 276   Basic Metabolic Panel: Recent Labs  Lab 02/17/21 1551  NA 135  K 5.1  CL 102  CO2 21*  GLUCOSE 180*  BUN 17  CREATININE 1.00  CALCIUM 8.7*   GFR: Estimated Creatinine Clearance: 69.5 mL/min (by C-G formula based on SCr of 1 mg/dL). Liver Function Tests: Recent Labs  Lab 02/17/21 1551  AST 26  ALT 18  ALKPHOS 63  BILITOT 0.7  PROT 6.9  ALBUMIN 3.5   No results for input(s): LIPASE, AMYLASE in the last 168 hours. No results for input(s): AMMONIA in the last 168 hours. Coagulation Profile: No results for input(s): INR, PROTIME in the last 168 hours. Cardiac Enzymes: No results for input(s): CKTOTAL, CKMB, CKMBINDEX, TROPONINI in the last 168 hours. BNP (last 3 results) No results for input(s): PROBNP in the last 8760 hours. HbA1C: No results for input(s): HGBA1C in the last 72 hours. CBG: Recent Labs  Lab 02/17/21 2144  GLUCAP 191*   Lipid Profile: No results for input(s): CHOL, HDL, LDLCALC, TRIG, CHOLHDL, LDLDIRECT in the last 72 hours. Thyroid Function Tests: Recent Labs    02/17/21 1551  TSH 3.519  FREET4 0.59*   Anemia Panel: No results for input(s): VITAMINB12, FOLATE, FERRITIN, TIBC, IRON, RETICCTPCT in the last 72 hours. Urine analysis:    Component Value Date/Time   COLORURINE YELLOW (A) 02/17/2021 1541   APPEARANCEUR HAZY (A) 02/17/2021 1541   APPEARANCEUR Clear 01/29/2012 1600   LABSPEC 1.014  02/17/2021 1541   LABSPEC 1.010 01/29/2012 1600   PHURINE 5.0 02/17/2021 1541   GLUCOSEU 50 (A) 02/17/2021 1541   GLUCOSEU 50 mg/dL 93/23/5573 2202   HGBUR NEGATIVE 02/17/2021 1541   BILIRUBINUR NEGATIVE 02/17/2021 1541   BILIRUBINUR Negative 01/29/2012 1600   KETONESUR 5 (A) 02/17/2021 1541   PROTEINUR 30 (A) 02/17/2021 1541   NITRITE NEGATIVE 02/17/2021 1541   LEUKOCYTESUR MODERATE (A) 02/17/2021 1541   LEUKOCYTESUR Negative 01/29/2012 1600   Sepsis Labs: @LABRCNTIP (procalcitonin:4,lacticidven:4)  )No results found for this or any previous visit (from the past 240 hour(s)).       Radiology Studies: DG Chest 2 View  Result Date: 02/17/2021 CLINICAL DATA:  80 year old male with weakness and dizziness EXAM: CHEST - 2 VIEW COMPARISON:  Chest radiograph dated 01/21/2021 FINDINGS: No focal consolidation, pleural effusion, or pneumothorax. Mild cardiomegaly. No acute osseous pathology. IMPRESSION: No active cardiopulmonary disease. Electronically Signed  By: Elgie Collard M.D.   On: 02/17/2021 16:29   MR ANGIO HEAD WO CONTRAST  Result Date: 02/17/2021 CLINICAL DATA:  Dizziness. EXAM: MRI HEAD WITHOUT CONTRAST MRA HEAD WITHOUT CONTRAST TECHNIQUE: Multiplanar, multi-echo pulse sequences of the brain and surrounding structures were acquired without intravenous contrast. Angiographic images of the Circle of Willis were acquired using MRA technique without intravenous contrast. COMPARISON: No pertinent prior exam. COMPARISON:  No pertinent prior exam. FINDINGS: MRI HEAD FINDINGS Brain: No acute infarction, hemorrhage, extra-axial collection or mass lesion. Moderate patchy and confluent T2/FLAIR hyperintensity within the white matter, nonspecific but most likely related to chronic microvascular ischemic disease. Moderate cerebral atrophy with ventriculomegaly that is most likely ex vacuo in etiology. Vascular: See below. Skull and upper cervical spine: Normal marrow signal. Sinuses/Orbits:  Clear sinuses.  Unremarkable orbits. Other: No sizable mastoid effusions. MRA HEAD FINDINGS Anterior circulation: Patent bilateral intracranial internal carotid arteries, MCAs and ACAs without proximal hemodynamically significant stenosis. No aneurysm. Posterior circulation: Patent intradural vertebral arteries, basilar arteries and posterior cerebral arteries without evidence of proximal hemodynamically significant stenosis. No visible aneurysm. IMPRESSION: MRI: 1. No evidence of acute intracranial abnormality.  No acute infarct. 2. Moderate chronic microvascular ischemic disease. 3. Similar ventriculomegaly, which is most likely secondary to moderate brain atrophy. MRA: No large vessel occlusion or proximal hemodynamically significant stenosis. Electronically Signed   By: Feliberto Harts MD   On: 02/17/2021 18:24   MR BRAIN WO CONTRAST  Result Date: 02/17/2021 CLINICAL DATA:  Dizziness. EXAM: MRI HEAD WITHOUT CONTRAST MRA HEAD WITHOUT CONTRAST TECHNIQUE: Multiplanar, multi-echo pulse sequences of the brain and surrounding structures were acquired without intravenous contrast. Angiographic images of the Circle of Willis were acquired using MRA technique without intravenous contrast. COMPARISON: No pertinent prior exam. COMPARISON:  No pertinent prior exam. FINDINGS: MRI HEAD FINDINGS Brain: No acute infarction, hemorrhage, extra-axial collection or mass lesion. Moderate patchy and confluent T2/FLAIR hyperintensity within the white matter, nonspecific but most likely related to chronic microvascular ischemic disease. Moderate cerebral atrophy with ventriculomegaly that is most likely ex vacuo in etiology. Vascular: See below. Skull and upper cervical spine: Normal marrow signal. Sinuses/Orbits: Clear sinuses.  Unremarkable orbits. Other: No sizable mastoid effusions. MRA HEAD FINDINGS Anterior circulation: Patent bilateral intracranial internal carotid arteries, MCAs and ACAs without proximal hemodynamically  significant stenosis. No aneurysm. Posterior circulation: Patent intradural vertebral arteries, basilar arteries and posterior cerebral arteries without evidence of proximal hemodynamically significant stenosis. No visible aneurysm. IMPRESSION: MRI: 1. No evidence of acute intracranial abnormality.  No acute infarct. 2. Moderate chronic microvascular ischemic disease. 3. Similar ventriculomegaly, which is most likely secondary to moderate brain atrophy. MRA: No large vessel occlusion or proximal hemodynamically significant stenosis. Electronically Signed   By: Feliberto Harts MD   On: 02/17/2021 18:24        Scheduled Meds: . apixaban  5 mg Oral BID  . insulin aspart  0-15 Units Subcutaneous TID WC  . insulin aspart  0-5 Units Subcutaneous QHS  . pravastatin  20 mg Oral Daily  . sodium chloride flush  3 mL Intravenous Q12H   Continuous Infusions:   LOS: 0 days    Time spent: 35 min    Silvano Bilis, MD [facility]   If 7PM-7AM, please contact night-coverage [email or web address] Password [facility] 02/18/2021, 7:38 AM

## 2021-02-18 NOTE — Consult Note (Signed)
[facility] Cardiology  CARDIOLOGY CONSULT NOTE  Patient ID: Harry [name] MRN: [id number] DOB/AGE: 12-17-1940 80 y.o.  Admit date: 02/17/2021 Referring Physician [facility] Primary Physician [facility] Primary Cardiologist Quanda Pavlicek Reason for Consultation sinus pauses  HPI: 80 year old gentleman referred for evaluation of syncope and sinus pauses.  The patient has a history of coronary artery disease, status post DES to distal RCA 11/06/2010, paroxysmal atrial fibrillation, mild aortic stenosis and essential hypertension.  He has a 3-week history of episodic dizziness and presyncope.  14-day Holter monitor was placed he was turned in on 02/14/2021, results pending.  He presented to [facility] ED on 02/17/2021 when he awoke with a sinking sensation similar to presyncopal episodes as described above.  ECG reveals sinus rhythm with first-degree AV block.  In the emergency room, telemetry revealed intermittent episodes of sinus pauses, the longest 5.4 seconds at 4:30 PM on 02/17/2021.  Cardizem CD 240 mg daily and metoprolol succinate 50 mg daily have been held.  Currently in sinus rhythm 72 bpm.  Denies chest pain or shortness of breath.  High-sensitivity troponin was normal (9, 12).  Review of systems complete and found to be negative unless listed above     Past Medical History:  Diagnosis Date  . Aortic stenosis   . CAD (coronary artery disease)   . Diabetes (HCC)   . HTN (hypertension)   . PAF (paroxysmal atrial fibrillation) (HCC)     History reviewed. No pertinent surgical history.  (Not in a hospital admission)  Social History   Socioeconomic History  . Marital status: Married    Spouse name: Not on file  . Number of children: Not on file  . Years of education: Not on file  . Highest education level: Not on file  Occupational History  . Not on file  Tobacco Use  . Smoking status: Never Smoker  . Smokeless tobacco: Never Used  Substance and Sexual Activity  . Alcohol use: No  . Drug use: No   . Sexual activity: Not on file  Other Topics Concern  . Not on file  Social History Narrative  . Not on file   Social Determinants of Health   Financial Resource Strain: Not on file  Food Insecurity: Not on file  Transportation Needs: Not on file  Physical Activity: Not on file  Stress: Not on file  Social Connections: Not on file  Intimate Partner Violence: Not on file    History reviewed. No pertinent family history.    Review of systems complete and found to be negative unless listed above      PHYSICAL EXAM  General: Well developed, well nourished, in no acute distress HEENT:  Normocephalic and atramatic Neck:  No JVD.  Lungs: Clear bilaterally to auscultation and percussion. Heart: HRRR . Normal S1 and S2 without gallops or murmurs.  Abdomen: Bowel sounds are positive, abdomen soft and non-tender  Msk:  Back normal, normal gait. Normal strength and tone for age. Extremities: No clubbing, cyanosis or edema.   Neuro: Alert and oriented X 3. Psych:  Good affect, responds appropriately  Labs:   Lab Results  Component Value Date   WBC 12.5 (H) 02/17/2021   HGB 11.4 (L) 02/17/2021   HCT 34.6 (L) 02/17/2021   MCV 80.8 02/17/2021   PLT 276 02/17/2021    Recent Labs  Lab 02/17/21 1551  NA 135  K 5.1  CL 102  CO2 21*  BUN 17  CREATININE 1.00  CALCIUM 8.7*  PROT 6.9  BILITOT 0.7  ALKPHOS 63  ALT 18  AST 26  GLUCOSE 180*   Lab Results  Component Value Date   CKTOTAL 93 01/29/2012   CKMB 0.7 01/29/2012   TROPONINI <0.03 09/03/2016   No results found for: CHOL No results found for: HDL No results found for: LDLCALC No results found for: TRIG No results found for: CHOLHDL No results found for: LDLDIRECT    Radiology: DG Chest 2 View  Result Date: 02/17/2021 CLINICAL DATA:  80 year old male with weakness and dizziness EXAM: CHEST - 2 VIEW COMPARISON:  Chest radiograph dated 01/21/2021 FINDINGS: No focal consolidation, pleural effusion, or  pneumothorax. Mild cardiomegaly. No acute osseous pathology. IMPRESSION: No active cardiopulmonary disease. Electronically Signed   By: Elgie Collard M.D.   On: 02/17/2021 16:29   CT Head Wo Contrast  Result Date: 01/21/2021 CLINICAL DATA:  Syncopal episode. EXAM: CT HEAD WITHOUT CONTRAST TECHNIQUE: Contiguous axial images were obtained from the base of the skull through the vertex without intravenous contrast. COMPARISON:  None. FINDINGS: Brain: There is mild cerebral atrophy with widening of the extra-axial spaces and ventricular dilatation. There are areas of decreased attenuation within the white matter tracts of the supratentorial brain, consistent with microvascular disease changes. Vascular: No hyperdense vessel or unexpected calcification. Skull: Normal. Negative for fracture or focal lesion. Sinuses/Orbits: No acute finding. Other: None. IMPRESSION: 1. Generalized cerebral atrophy. 2. No acute intracranial abnormality. Electronically Signed   By: Aram Candela M.D.   On: 01/21/2021 02:06   MR ANGIO HEAD WO CONTRAST  Result Date: 02/17/2021 CLINICAL DATA:  Dizziness. EXAM: MRI HEAD WITHOUT CONTRAST MRA HEAD WITHOUT CONTRAST TECHNIQUE: Multiplanar, multi-echo pulse sequences of the brain and surrounding structures were acquired without intravenous contrast. Angiographic images of the Circle of Willis were acquired using MRA technique without intravenous contrast. COMPARISON: No pertinent prior exam. COMPARISON:  No pertinent prior exam. FINDINGS: MRI HEAD FINDINGS Brain: No acute infarction, hemorrhage, extra-axial collection or mass lesion. Moderate patchy and confluent T2/FLAIR hyperintensity within the white matter, nonspecific but most likely related to chronic microvascular ischemic disease. Moderate cerebral atrophy with ventriculomegaly that is most likely ex vacuo in etiology. Vascular: See below. Skull and upper cervical spine: Normal marrow signal. Sinuses/Orbits: Clear sinuses.   Unremarkable orbits. Other: No sizable mastoid effusions. MRA HEAD FINDINGS Anterior circulation: Patent bilateral intracranial internal carotid arteries, MCAs and ACAs without proximal hemodynamically significant stenosis. No aneurysm. Posterior circulation: Patent intradural vertebral arteries, basilar arteries and posterior cerebral arteries without evidence of proximal hemodynamically significant stenosis. No visible aneurysm. IMPRESSION: MRI: 1. No evidence of acute intracranial abnormality.  No acute infarct. 2. Moderate chronic microvascular ischemic disease. 3. Similar ventriculomegaly, which is most likely secondary to moderate brain atrophy. MRA: No large vessel occlusion or proximal hemodynamically significant stenosis. Electronically Signed   By: Feliberto Harts MD   On: 02/17/2021 18:24   MR BRAIN WO CONTRAST  Result Date: 02/17/2021 CLINICAL DATA:  Dizziness. EXAM: MRI HEAD WITHOUT CONTRAST MRA HEAD WITHOUT CONTRAST TECHNIQUE: Multiplanar, multi-echo pulse sequences of the brain and surrounding structures were acquired without intravenous contrast. Angiographic images of the Circle of Willis were acquired using MRA technique without intravenous contrast. COMPARISON: No pertinent prior exam. COMPARISON:  No pertinent prior exam. FINDINGS: MRI HEAD FINDINGS Brain: No acute infarction, hemorrhage, extra-axial collection or mass lesion. Moderate patchy and confluent T2/FLAIR hyperintensity within the white matter, nonspecific but most likely related to chronic microvascular ischemic disease. Moderate cerebral atrophy with ventriculomegaly that is most likely ex vacuo in etiology.  Vascular: See below. Skull and upper cervical spine: Normal marrow signal. Sinuses/Orbits: Clear sinuses.  Unremarkable orbits. Other: No sizable mastoid effusions. MRA HEAD FINDINGS Anterior circulation: Patent bilateral intracranial internal carotid arteries, MCAs and ACAs without proximal hemodynamically significant  stenosis. No aneurysm. Posterior circulation: Patent intradural vertebral arteries, basilar arteries and posterior cerebral arteries without evidence of proximal hemodynamically significant stenosis. No visible aneurysm. IMPRESSION: MRI: 1. No evidence of acute intracranial abnormality.  No acute infarct. 2. Moderate chronic microvascular ischemic disease. 3. Similar ventriculomegaly, which is most likely secondary to moderate brain atrophy. MRA: No large vessel occlusion or proximal hemodynamically significant stenosis. Electronically Signed   By: Feliberto Harts MD   On: 02/17/2021 18:24   DG Chest Portable 1 View  Result Date: 01/21/2021 CLINICAL DATA:  Syncopal episode. EXAM: PORTABLE CHEST 1 VIEW COMPARISON:  September 03, 2016 FINDINGS: The cardiac silhouette is mildly enlarged. Both lungs are clear. Degenerative changes seen throughout the thoracic spine. IMPRESSION: No active disease. Electronically Signed   By: Aram Candela M.D.   On: 01/21/2021 02:04    EKG: Sinus rhythm with first-degree AV block at 68 bpm  ASSESSMENT AND PLAN:   1.  Sinus pauses with presyncope 2.  Coronary artery disease, status post DES distal RCA 11/06/2010, currently denies chest pain or shortness of breath, with normal high-sensitivity troponin 3.  Paroxysmal atrial fibrillation, CHA2DS2-VASc score of 4, on Eliquis for stroke prevention 4.  Mild aortic stenosis  Recommendations  1.  Agree with current therapy 2.  Agree with holding Cardizem CD and metoprolol succinate for now.  Plan to resume metoprolol succinate to resolution of symptomatic sinus pauses. 3.  Hold Eliquis for now in anticipation for possible pacemaker 4.  Possible permanent pacemaker on 02/20/2021  Signed: Marcina Millard MD,PhD, [facility] 02/18/2021, 9:03 AM

## 2021-02-18 NOTE — ED Notes (Signed)
Breakfast tray provided; pt able to feed self. 

## 2021-02-18 NOTE — ED Notes (Signed)
Rn spoke with admission RN, Asher Muir.  Asher Muir explained that patient is ready to come up, and that she will speak to her charge RN on how to turn the purple man green.

## 2021-02-19 ENCOUNTER — Observation Stay
Admit: 2021-02-19 | Discharge: 2021-02-19 | Disposition: A | Discharge status: Home / Self Care | Payer: Medicare HMO | Attending: Cardiology | Admitting: Cardiology

## 2021-02-19 LAB — SURGICAL PCR SCREEN
MRSA, PCR: NEGATIVE
Staphylococcus aureus: NEGATIVE

## 2021-02-19 LAB — BASIC METABOLIC PANEL
Anion gap: 10 (ref 5–15)
BUN: 23 mg/dL (ref 8–23)
CO2: 25 mmol/L (ref 22–32)
Calcium: 9 mg/dL (ref 8.9–10.3)
Chloride: 102 mmol/L (ref 98–111)
Creatinine, Ser: 1.09 mg/dL (ref 0.61–1.24)
GFR, Estimated: >60 mL/min (ref >60)
Glucose, Bld: 164 mg/dL — ABNORMAL HIGH (ref 70–99)
Potassium: 4.5 mmol/L (ref 3.5–5.1)
Sodium: 137 mmol/L (ref 135–145)

## 2021-02-19 LAB — GLUCOSE, CAPILLARY
Glucose-Capillary: 180 mg/dL — ABNORMAL HIGH (ref 70–99)
Glucose-Capillary: 164 mg/dL — ABNORMAL HIGH (ref 70–99)
Glucose-Capillary: 178 mg/dL — ABNORMAL HIGH (ref 70–99)
Glucose-Capillary: 210 mg/dL — ABNORMAL HIGH (ref 70–99)
Glucose-Capillary: 215 mg/dL — ABNORMAL HIGH (ref 70–99)

## 2021-02-19 LAB — T3, FREE: T3, Free: 2.2 pg/mL (ref 2.0–4.4)

## 2021-02-19 MED ORDER — ADULT MULTIVITAMIN W/MINERALS CH
1.0000 | ORAL_TABLET | Freq: Every day | ORAL | Status: DC
  Administered 2021-02-19 – 2021-02-21 (×2): 1 via ORAL
  Filled 2021-02-19 (×3): qty 1

## 2021-02-19 MED ORDER — SODIUM CHLORIDE 0.9 % IV SOLN: INTRAVENOUS | Status: DC

## 2021-02-19 MED ORDER — SODIUM CHLORIDE 0.45 % IV SOLN: INTRAVENOUS | Status: DC

## 2021-02-19 NOTE — Progress Notes (Signed)
Initial Nutrition Assessment  DOCUMENTATION CODES:  Obesity unspecified  INTERVENTION:  Advance diet as medically able.  Add Magic cup BID, each supplement provides 290 kcal and 9 grams of protein.  Add MVI with minerals daily.  NUTRITION DIAGNOSIS:  Increased nutrient needs related to acute illness,post-op healing as evidenced by estimated needs.  GOAL:  Patient will meet greater than or equal to 90% of their needs  MONITOR:  Diet advancement,PO intake,Supplement acceptance,Labs,Weight trends,Skin,I & O's  REASON FOR ASSESSMENT:  Malnutrition Screening Tool    ASSESSMENT:  80 yo male with a PMH of T2DM, PAF, HTN, CAD, and mild aortic stenosis who presents with symptomatic sinus bradycardia. 6/1 - plan for pacemaker placement  Spoke with pt at bedside. Pt reports having an appetite and eating what he can here. He reports having difficulty chewing some items because he does not have dentures and has poor dentition. However, upon asking, he does not want his diet downgraded, as he is able to find things he can eat. Per Epic, pt ate 80% of breakfast and 100% of lunch and dinner yesterday, and 100% of breakfast this morning.  He denies any significant weight loss. He reports his weight has been stable for the past 3 years, and Epic endorses this.  On exam, pt has no significant depletions that are not from age.  Recommend adding Magic Cup BID and MVI with minerals daily for increased nutrient needs.  Medications: reviewed; SSI, bedtime novolog, lantus  Labs: reviewed; CBG 164-183  NUTRITION - FOCUSED PHYSICAL EXAM: Flowsheet Row Most Recent Value  Orbital Region Mild depletion  Upper Arm Region No depletion  Thoracic and Lumbar Region No depletion  Buccal Region No depletion  Temple Region Mild depletion  Clavicle Bone Region No depletion  Clavicle and Acromion Bone Region No depletion  Scapular Bone Region Unable to assess  Dorsal Hand No depletion  Patellar Region No  depletion  Anterior Thigh Region No depletion  Posterior Calf Region No depletion  Edema (RD Assessment) None  Hair Reviewed  Eyes Reviewed  Mouth Reviewed  Skin Reviewed  Nails Reviewed     Diet Order:   Diet Order            Diet NPO time specified Except for: Sips with Meds  Diet effective midnight           Diet NPO time specified  Diet effective now                EDUCATION NEEDS:  Education needs have been addressed  Skin:  Skin Assessment: Skin Integrity Issues: Skin Integrity Issues:: Stage I Stage I: Pressure Injury on mid sacrum  Last BM:  02/17/21  Height:  Ht Readings from Last 1 Encounters:  02/17/21 5\' 7"  (1.702 m)   Weight:  Wt Readings from Last 1 Encounters:  02/17/21 102.5 kg   Ideal Body Weight:  67.3 kg  BMI:  Body mass index is 35.4 kg/m.  Estimated Nutritional Needs:  Kcal:  1750-1950 Protein:  115-130 grams Fluid:  >1.75 L  02/19/21, RD, LDN Registered Dietitian After Hours/Weekend Pager # in [place]

## 2021-02-19 NOTE — Progress Notes (Signed)
[facility] Cardiology  SUBJECTIVE: Patient laying in bed, reports feeling well, slept well, denies chest pain, shortness of breath or presyncope   Vitals:   02/18/21 2326 02/19/21 0519 02/19/21 0520 02/19/21 0735  BP: 136/63 138/74  (!) 160/69  Pulse: 72 67  74  Resp:  14  18  Temp: 98.2 F (36.8 C)  98 F (36.7 C) 98.7 F (37.1 C)  TempSrc:    Oral  SpO2: 94% 95%  92%  Weight:      Height:         Intake/Output Summary (Last 24 hours) at 02/19/2021 0805 Last data filed at 02/19/2021 0600 Gross per 24 hour  Intake 540 ml  Output 1100 ml  Net -560 ml      PHYSICAL EXAM  General: Well developed, well nourished, in no acute distress HEENT:  Normocephalic and atramatic Neck:  No JVD.  Lungs: Clear bilaterally to auscultation and percussion. Heart: HRRR . Normal S1 and S2 without gallops or murmurs.  Abdomen: Bowel sounds are positive, abdomen soft and non-tender  Msk:  Back normal, normal gait. Normal strength and tone for age. Extremities: No clubbing, cyanosis or edema.   Neuro: Alert and oriented X 3. Psych:  Good affect, responds appropriately   LABS: Basic Metabolic Panel: Recent Labs    02/17/21 1551 02/19/21 0657  NA 135 137  K 5.1 4.5  CL 102 102  CO2 21* 25  GLUCOSE 180* 164*  BUN 17 23  CREATININE 1.00 1.09  CALCIUM 8.7* 9.0   Liver Function Tests: Recent Labs    02/17/21 1551  AST 26  ALT 18  ALKPHOS 63  BILITOT 0.7  PROT 6.9  ALBUMIN 3.5   No results for input(s): LIPASE, AMYLASE in the last 72 hours. CBC: Recent Labs    02/17/21 1551  WBC 12.5*  NEUTROABS 7.5  HGB 11.4*  HCT 34.6*  MCV 80.8  PLT 276   Cardiac Enzymes: No results for input(s): CKTOTAL, CKMB, CKMBINDEX, TROPONINI in the last 72 hours. BNP: Invalid input(s): POCBNP D-Dimer: No results for input(s): DDIMER in the last 72 hours. Hemoglobin A1C: No results for input(s): HGBA1C in the last 72 hours. Fasting Lipid Panel: No results for input(s): CHOL, HDL, LDLCALC,  TRIG, CHOLHDL, LDLDIRECT in the last 72 hours. Thyroid Function Tests: Recent Labs    02/17/21 1551  TSH 3.519   Anemia Panel: No results for input(s): VITAMINB12, FOLATE, FERRITIN, TIBC, IRON, RETICCTPCT in the last 72 hours.  DG Chest 2 View  Result Date: 02/17/2021 CLINICAL DATA:  80 year old male with weakness and dizziness EXAM: CHEST - 2 VIEW COMPARISON:  Chest radiograph dated 01/21/2021 FINDINGS: No focal consolidation, pleural effusion, or pneumothorax. Mild cardiomegaly. No acute osseous pathology. IMPRESSION: No active cardiopulmonary disease. Electronically Signed   By: Elgie Collard M.D.   On: 02/17/2021 16:29   MR ANGIO HEAD WO CONTRAST  Result Date: 02/17/2021 CLINICAL DATA:  Dizziness. EXAM: MRI HEAD WITHOUT CONTRAST MRA HEAD WITHOUT CONTRAST TECHNIQUE: Multiplanar, multi-echo pulse sequences of the brain and surrounding structures were acquired without intravenous contrast. Angiographic images of the Circle of Willis were acquired using MRA technique without intravenous contrast. COMPARISON: No pertinent prior exam. COMPARISON:  No pertinent prior exam. FINDINGS: MRI HEAD FINDINGS Brain: No acute infarction, hemorrhage, extra-axial collection or mass lesion. Moderate patchy and confluent T2/FLAIR hyperintensity within the white matter, nonspecific but most likely related to chronic microvascular ischemic disease. Moderate cerebral atrophy with ventriculomegaly that is most likely ex vacuo in etiology.  Vascular: See below. Skull and upper cervical spine: Normal marrow signal. Sinuses/Orbits: Clear sinuses.  Unremarkable orbits. Other: No sizable mastoid effusions. MRA HEAD FINDINGS Anterior circulation: Patent bilateral intracranial internal carotid arteries, MCAs and ACAs without proximal hemodynamically significant stenosis. No aneurysm. Posterior circulation: Patent intradural vertebral arteries, basilar arteries and posterior cerebral arteries without evidence of proximal  hemodynamically significant stenosis. No visible aneurysm. IMPRESSION: MRI: 1. No evidence of acute intracranial abnormality.  No acute infarct. 2. Moderate chronic microvascular ischemic disease. 3. Similar ventriculomegaly, which is most likely secondary to moderate brain atrophy. MRA: No large vessel occlusion or proximal hemodynamically significant stenosis. Electronically Signed   By: Feliberto Harts MD   On: 02/17/2021 18:24   MR BRAIN WO CONTRAST  Result Date: 02/17/2021 CLINICAL DATA:  Dizziness. EXAM: MRI HEAD WITHOUT CONTRAST MRA HEAD WITHOUT CONTRAST TECHNIQUE: Multiplanar, multi-echo pulse sequences of the brain and surrounding structures were acquired without intravenous contrast. Angiographic images of the Circle of Willis were acquired using MRA technique without intravenous contrast. COMPARISON: No pertinent prior exam. COMPARISON:  No pertinent prior exam. FINDINGS: MRI HEAD FINDINGS Brain: No acute infarction, hemorrhage, extra-axial collection or mass lesion. Moderate patchy and confluent T2/FLAIR hyperintensity within the white matter, nonspecific but most likely related to chronic microvascular ischemic disease. Moderate cerebral atrophy with ventriculomegaly that is most likely ex vacuo in etiology. Vascular: See below. Skull and upper cervical spine: Normal marrow signal. Sinuses/Orbits: Clear sinuses.  Unremarkable orbits. Other: No sizable mastoid effusions. MRA HEAD FINDINGS Anterior circulation: Patent bilateral intracranial internal carotid arteries, MCAs and ACAs without proximal hemodynamically significant stenosis. No aneurysm. Posterior circulation: Patent intradural vertebral arteries, basilar arteries and posterior cerebral arteries without evidence of proximal hemodynamically significant stenosis. No visible aneurysm. IMPRESSION: MRI: 1. No evidence of acute intracranial abnormality.  No acute infarct. 2. Moderate chronic microvascular ischemic disease. 3. Similar  ventriculomegaly, which is most likely secondary to moderate brain atrophy. MRA: No large vessel occlusion or proximal hemodynamically significant stenosis. Electronically Signed   By: Feliberto Harts MD   On: 02/17/2021 18:24     Echo   TELEMETRY: Sinus rhythm at 75 bpm:  ASSESSMENT AND PLAN:  Principal Problem:   Symptomatic sinus bradycardia Active Problems:   Aortic stenosis, mild   Coronary artery disease involving native coronary artery of native heart without angina pectoris   DMII (diabetes mellitus, type 2) (HCC)   Essential hypertension   Obstructive sleep apnea   Paroxysmal atrial fibrillation (HCC)   Postural dizziness with presyncope   Sinus pause   Chronic anticoagulation    1.  Intermittent pauses, secondary to sinus pauses, as well as high-grade AV block.  Patient had 3.6-second episode of high-grade AV block on 02/19/2021 at 2:14 AM which time patient was asymptomatic and asleep.  Telemetry today reveals sinus rhythm at 75 bpm. 2.  Coronary artery disease, status post DES distal RCA 11/06/2010, currently denies chest pain or shortness of breath, with normal high-sensitivity troponin 3.  Paroxysmal atrial fibrillation, CHA2DS2-VASc score of 4, on Eliquis for stroke prevention 4.  Mild aortic stenosis  Recommendations  1.  Agree with current therapy 2.  Agree with holding Cardizem CD and metoprolol succinate for now 3.  Hold Eliquis for now in anticipation for possible pacemaker 4.   Micra AV leadless pacemaker to be scheduled for 02/20/2021.  We discussed the risk, benefits and alternatives of additional dual-chamber pacemaker versus Micra AV leadless pacemaker and the patient is in agreement to proceed with Micra AV leadless  pacemaker on 02/20/2021.   Marcina Millard, MD, PhD, [facility] 02/19/2021 8:05 AM

## 2021-02-19 NOTE — Progress Notes (Signed)
PROGRESS NOTE    Harry [name]  IRW:[id number] DOB: 01/17/1941 DOA: 02/17/2021 PCP: Kandyce Rud, MD  Outpatient Specialists: [facility]    Brief Narrative:   Harry [name] is a 80 y.o. male with medical history significant for DM 2, paroxysmal A. fib on Eliquis, HTN, CAD, mild aortic stenosis, presenting with a 3-week history of episodic dizziness and lightheadedness without precipitating or aggravating factors.  Was evaluated in the ER and hydrated but symptoms continued.  Had a Holter monitor for 2 weeks but interpretation still pending.  On the day of arrival, he was awoken by an episode of feeling swimmy in the head and presented to the emergency room for evaluation.  He denies associated chest pain, shortness of breath or palpitations.  He has had no cough fever or chills, no nausea, vomiting, abdominal pain or change in bowel habits and no dysuria.  Denies visual disturbance, one-sided numbness weakness or tingling.    Assessment & Plan:   Principal Problem:   Symptomatic sinus bradycardia Active Problems:   Aortic stenosis, mild   Coronary artery disease involving native coronary artery of native heart without angina pectoris   DMII (diabetes mellitus, type 2) (HCC)   Essential hypertension   Obstructive sleep apnea   Paroxysmal atrial fibrillation (HCC)   Postural dizziness with presyncope   Sinus pause   Chronic anticoagulation     Symptomatic sinus bradycardia   Postural dizziness with presyncope   Sinus pause Patient with dizzy spells, occurring during work-up in the ED, correlating with sinus pause of 2 to 3 seconds and bradycardia in the 30s to 40s. Troponin 9-12, BNP 36.4, TSH 3.5, EKG nonacute, does show 1st degree av block. Mri/mra non-acute. Recent 14 day holter, results not yet available. Home metop/dilt likely contributing - hold home dilt/toprol - monitor on tele - plan for pacemaker placement tomorrow - tte pending    Aortic stenosis,  mild - No acute disease suspected at this time    Coronary artery disease involving native coronary artery of native heart without angina pectoris - Patient denies chest pain, EKG nonacute and troponin negative x2 - Continue home pravastatin and aspirin.  Metoprolol being held due to bradycardia - eliquis on hold for tomorrow's procedure    DMII (diabetes mellitus, type 2) (HCC) - Sliding scale insulin coverage - start lantus 30 for home 70/30, will hold tomorrow's dose given will be npo    Essential hypertension - Continue lisinopril - metop and dilt on hold    Obstructive sleep apnea - CPAP qhs    Paroxysmal atrial fibrillation (HCC)   Chronic anticoagulation - holding eliquis - Rate control agents being held due to symptomatic bradycardia   DVT prophylaxis: scds Code Status: full Family Communication: daughter updated telephonically 5/31  Level of care: Progressive Cardiac Status is: Observation  The patient remains OBS appropriate and will d/c before 2 midnights.  Dispo: The patient is from: Home              Anticipated d/c is to: Home              Patient currently is not medically stable to d/c.   Difficult to place patient No        Consultants:  cardiology  Procedures: none  Antimicrobials:  none    Subjective: This morning feeling well. No lightheadedness overnight, slept ok. Has appetite.  Objective: Vitals:   02/18/21 2326 02/19/21 0519 02/19/21 0520 02/19/21 0735  BP: 136/63 138/74  Marland Kitchen)  160/69  Pulse: 72 67  74  Resp:  14  18  Temp: 98.2 F (36.8 C)  98 F (36.7 C) 98.7 F (37.1 C)  TempSrc:    Oral  SpO2: 94% 95%  92%  Weight:      Height:        Intake/Output Summary (Last 24 hours) at 02/19/2021 1052 Last data filed at 02/19/2021 0950 Gross per 24 hour  Intake 900 ml  Output 1700 ml  Net -800 ml   Filed Weights   02/17/21 1542  Weight: 102.5 kg    Examination:  General exam: Appears calm and comfortable   Respiratory system: Clear to auscultation. Respiratory effort normal. Cardiovascular system: S1 & S2 heard, RRR. Mod holosystolic murmur. No edema Gastrointestinal system: Abdomen is obese, soft and nontender. No organomegaly or masses felt. Normal bowel sounds heard. Central nervous system: Alert and oriented. No focal neurological deficits. Extremities: Symmetric 5 x 5 power. Skin: No rashes, lesions or ulcers Psychiatry: Judgement and insight appear normal. Mood & affect appropriate.     Data Reviewed: I have personally reviewed following labs and imaging studies  CBC: Recent Labs  Lab 02/17/21 1551  WBC 12.5*  NEUTROABS 7.5  HGB 11.4*  HCT 34.6*  MCV 80.8  PLT 276   Basic Metabolic Panel: Recent Labs  Lab 02/17/21 1551 02/19/21 0657  NA 135 137  K 5.1 4.5  CL 102 102  CO2 21* 25  GLUCOSE 180* 164*  BUN 17 23  CREATININE 1.00 1.09  CALCIUM 8.7* 9.0   GFR: Estimated Creatinine Clearance: 63.8 mL/min (by C-G formula based on SCr of 1.09 mg/dL). Liver Function Tests: Recent Labs  Lab 02/17/21 1551  AST 26  ALT 18  ALKPHOS 63  BILITOT 0.7  PROT 6.9  ALBUMIN 3.5   No results for input(s): LIPASE, AMYLASE in the last 168 hours. No results for input(s): AMMONIA in the last 168 hours. Coagulation Profile: No results for input(s): INR, PROTIME in the last 168 hours. Cardiac Enzymes: No results for input(s): CKTOTAL, CKMB, CKMBINDEX, TROPONINI in the last 168 hours. BNP (last 3 results) No results for input(s): PROBNP in the last 8760 hours. HbA1C: No results for input(s): HGBA1C in the last 72 hours. CBG: Recent Labs  Lab 02/18/21 1234 02/18/21 1655 02/18/21 2107 02/19/21 0516 02/19/21 0737  GLUCAP 166* 183* 178* 180* 164*   Lipid Profile: No results for input(s): CHOL, HDL, LDLCALC, TRIG, CHOLHDL, LDLDIRECT in the last 72 hours. Thyroid Function Tests: Recent Labs    02/17/21 1551  TSH 3.519  FREET4 0.59*   Anemia Panel: No results for  input(s): VITAMINB12, FOLATE, FERRITIN, TIBC, IRON, RETICCTPCT in the last 72 hours. Urine analysis:    Component Value Date/Time   COLORURINE YELLOW (A) 02/17/2021 1541   APPEARANCEUR HAZY (A) 02/17/2021 1541   APPEARANCEUR Clear 01/29/2012 1600   LABSPEC 1.014 02/17/2021 1541   LABSPEC 1.010 01/29/2012 1600   PHURINE 5.0 02/17/2021 1541   GLUCOSEU 50 (A) 02/17/2021 1541   GLUCOSEU 50 mg/dL 07/37/1062 6948   HGBUR NEGATIVE 02/17/2021 1541   BILIRUBINUR NEGATIVE 02/17/2021 1541   BILIRUBINUR Negative 01/29/2012 1600   KETONESUR 5 (A) 02/17/2021 1541   PROTEINUR 30 (A) 02/17/2021 1541   NITRITE NEGATIVE 02/17/2021 1541   LEUKOCYTESUR MODERATE (A) 02/17/2021 1541   LEUKOCYTESUR Negative 01/29/2012 1600   Sepsis Labs: @LABRCNTIP (procalcitonin:4,lacticidven:4)  ) Recent Results (from the past 240 hour(s))  Urine Culture     Status: None (Preliminary result)  Collection Time: 02/17/21  3:41 PM   Specimen: Urine, Clean Catch  Result Value Ref Range Status   Specimen Description   Final    URINE, CLEAN CATCH Performed at [facility], [address] [place], [place] [address]    Special Requests   Final    NONE Performed at [facility], [address] [place], [place] [address]    Culture   Final    CULTURE REINCUBATED FOR BETTER GROWTH Performed at [facility], [address] [place], [place] [address]    Report Status PENDING  Incomplete  SARS CORONAVIRUS 2 (TAT 6-24 HRS) Nasopharyngeal Nasopharyngeal Swab     Status: None   Collection Time: 02/17/21 11:42 PM   Specimen: Nasopharyngeal Swab  Result Value Ref Range Status   SARS Coronavirus 2 NEGATIVE NEGATIVE Final    Comment: (NOTE) SARS-CoV-2 target nucleic acids are NOT DETECTED.  The SARS-CoV-2 RNA is generally detectable in upper and lower respiratory specimens during the acute phase of infection. Negative results do not preclude SARS-CoV-2 infection, do not rule out co-infections  with other pathogens, and should not be used as the sole basis for treatment or other patient management decisions. Negative results must be combined with clinical observations, patient history, and epidemiological information. The expected result is Negative.  Fact Sheet for Patients: HairSlick.nohttps://www.fda.gov/media/138098/download  Fact Sheet for Healthcare Providers: quierodirigir.comhttps://www.fda.gov/media/138095/download  This test is not yet approved or cleared by the [place] FDA and  has been authorized for detection and/or diagnosis of SARS-CoV-2 by FDA under an Emergency Use Authorization (EUA). This EUA will remain  in effect (meaning this test can be used) for the duration of the COVID-19 declaration under Se ction 564(b)(1) of the Act, 21 U.S.C. section 360bbb-3(b)(1), unless the authorization is terminated or revoked sooner.  Performed at [facility], [address] [place], [place] [address]          Radiology Studies: DG Chest 2 View  Result Date: 02/17/2021 CLINICAL DATA:  80 year old male with weakness and dizziness EXAM: CHEST - 2 VIEW COMPARISON:  Chest radiograph dated 01/21/2021 FINDINGS: No focal consolidation, pleural effusion, or pneumothorax. Mild cardiomegaly. No acute osseous pathology. IMPRESSION: No active cardiopulmonary disease. Electronically Signed   By: Elgie CollardArash  Radparvar M.D.   On: 02/17/2021 16:29   MR ANGIO HEAD WO CONTRAST  Result Date: 02/17/2021 CLINICAL DATA:  Dizziness. EXAM: MRI HEAD WITHOUT CONTRAST MRA HEAD WITHOUT CONTRAST TECHNIQUE: Multiplanar, multi-echo pulse sequences of the brain and surrounding structures were acquired without intravenous contrast. Angiographic images of the Circle of Willis were acquired using MRA technique without intravenous contrast. COMPARISON: No pertinent prior exam. COMPARISON:  No pertinent prior exam. FINDINGS: MRI HEAD FINDINGS Brain: No acute infarction, hemorrhage, extra-axial collection or mass lesion.  Moderate patchy and confluent T2/FLAIR hyperintensity within the white matter, nonspecific but most likely related to chronic microvascular ischemic disease. Moderate cerebral atrophy with ventriculomegaly that is most likely ex vacuo in etiology. Vascular: See below. Skull and upper cervical spine: Normal marrow signal. Sinuses/Orbits: Clear sinuses.  Unremarkable orbits. Other: No sizable mastoid effusions. MRA HEAD FINDINGS Anterior circulation: Patent bilateral intracranial internal carotid arteries, MCAs and ACAs without proximal hemodynamically significant stenosis. No aneurysm. Posterior circulation: Patent intradural vertebral arteries, basilar arteries and posterior cerebral arteries without evidence of proximal hemodynamically significant stenosis. No visible aneurysm. IMPRESSION: MRI: 1. No evidence of acute intracranial abnormality.  No acute infarct. 2. Moderate chronic microvascular ischemic disease. 3. Similar ventriculomegaly, which is  most likely secondary to moderate brain atrophy. MRA: No large vessel occlusion or proximal hemodynamically significant stenosis. Electronically Signed   By: Feliberto Harts MD   On: 02/17/2021 18:24   MR BRAIN WO CONTRAST  Result Date: 02/17/2021 CLINICAL DATA:  Dizziness. EXAM: MRI HEAD WITHOUT CONTRAST MRA HEAD WITHOUT CONTRAST TECHNIQUE: Multiplanar, multi-echo pulse sequences of the brain and surrounding structures were acquired without intravenous contrast. Angiographic images of the Circle of Willis were acquired using MRA technique without intravenous contrast. COMPARISON: No pertinent prior exam. COMPARISON:  No pertinent prior exam. FINDINGS: MRI HEAD FINDINGS Brain: No acute infarction, hemorrhage, extra-axial collection or mass lesion. Moderate patchy and confluent T2/FLAIR hyperintensity within the white matter, nonspecific but most likely related to chronic microvascular ischemic disease. Moderate cerebral atrophy with ventriculomegaly that is most  likely ex vacuo in etiology. Vascular: See below. Skull and upper cervical spine: Normal marrow signal. Sinuses/Orbits: Clear sinuses.  Unremarkable orbits. Other: No sizable mastoid effusions. MRA HEAD FINDINGS Anterior circulation: Patent bilateral intracranial internal carotid arteries, MCAs and ACAs without proximal hemodynamically significant stenosis. No aneurysm. Posterior circulation: Patent intradural vertebral arteries, basilar arteries and posterior cerebral arteries without evidence of proximal hemodynamically significant stenosis. No visible aneurysm. IMPRESSION: MRI: 1. No evidence of acute intracranial abnormality.  No acute infarct. 2. Moderate chronic microvascular ischemic disease. 3. Similar ventriculomegaly, which is most likely secondary to moderate brain atrophy. MRA: No large vessel occlusion or proximal hemodynamically significant stenosis. Electronically Signed   By: Feliberto Harts MD   On: 02/17/2021 18:24        Scheduled Meds: . aspirin  81 mg Oral Daily  . insulin aspart  0-15 Units Subcutaneous TID WC  . insulin aspart  0-5 Units Subcutaneous QHS  . insulin glargine  30 Units Subcutaneous Daily  . lisinopril  5 mg Oral Daily  . pravastatin  20 mg Oral Daily  . sodium chloride flush  3 mL Intravenous Q12H   Continuous Infusions:   LOS: 0 days    Time spent: 35 min    Silvano Bilis, MD [facility]   If 7PM-7AM, please contact night-coverage [email or web address] Password [facility] 02/19/2021, 10:52 AM

## 2021-02-20 ENCOUNTER — Encounter
Admission: EM | Disposition: A | Discharge status: Home / Self Care | Payer: Self-pay | Source: Home / Self Care | Attending: Obstetrics and Gynecology

## 2021-02-20 ENCOUNTER — Encounter: Payer: Self-pay | Admitting: Cardiology

## 2021-02-20 DIAGNOSIS — Z7984 Long term (current) use of oral hypoglycemic drugs: Secondary | ICD-10-CM | POA: Diagnosis not present

## 2021-02-20 DIAGNOSIS — I2510 Atherosclerotic heart disease of native coronary artery without angina pectoris: Secondary | ICD-10-CM

## 2021-02-20 DIAGNOSIS — R001 Bradycardia, unspecified: Secondary | ICD-10-CM | POA: Diagnosis not present

## 2021-02-20 DIAGNOSIS — G4733 Obstructive sleep apnea (adult) (pediatric): Secondary | ICD-10-CM | POA: Diagnosis present

## 2021-02-20 DIAGNOSIS — I350 Nonrheumatic aortic (valve) stenosis: Secondary | ICD-10-CM

## 2021-02-20 DIAGNOSIS — Z79899 Other long term (current) drug therapy: Secondary | ICD-10-CM | POA: Diagnosis not present

## 2021-02-20 DIAGNOSIS — R55 Syncope and collapse: Secondary | ICD-10-CM | POA: Diagnosis not present

## 2021-02-20 DIAGNOSIS — Z794 Long term (current) use of insulin: Secondary | ICD-10-CM | POA: Diagnosis not present

## 2021-02-20 DIAGNOSIS — N400 Enlarged prostate without lower urinary tract symptoms: Secondary | ICD-10-CM | POA: Diagnosis present

## 2021-02-20 DIAGNOSIS — Z20822 Contact with and (suspected) exposure to covid-19: Secondary | ICD-10-CM | POA: Diagnosis present

## 2021-02-20 DIAGNOSIS — I442 Atrioventricular block, complete: Secondary | ICD-10-CM | POA: Diagnosis present

## 2021-02-20 DIAGNOSIS — I455 Other specified heart block: Secondary | ICD-10-CM | POA: Diagnosis not present

## 2021-02-20 DIAGNOSIS — Z7901 Long term (current) use of anticoagulants: Secondary | ICD-10-CM

## 2021-02-20 DIAGNOSIS — E119 Type 2 diabetes mellitus without complications: Secondary | ICD-10-CM | POA: Diagnosis present

## 2021-02-20 DIAGNOSIS — I480 Paroxysmal atrial fibrillation: Secondary | ICD-10-CM | POA: Diagnosis present

## 2021-02-20 DIAGNOSIS — I10 Essential (primary) hypertension: Secondary | ICD-10-CM | POA: Diagnosis present

## 2021-02-20 DIAGNOSIS — Z006 Encounter for examination for normal comparison and control in clinical research program: Secondary | ICD-10-CM | POA: Diagnosis not present

## 2021-02-20 DIAGNOSIS — Z955 Presence of coronary angioplasty implant and graft: Secondary | ICD-10-CM | POA: Diagnosis not present

## 2021-02-20 DIAGNOSIS — L89151 Pressure ulcer of sacral region, stage 1: Secondary | ICD-10-CM | POA: Diagnosis present

## 2021-02-20 DIAGNOSIS — L8990 Pressure ulcer of unspecified site, unspecified stage: Secondary | ICD-10-CM | POA: Insufficient documentation

## 2021-02-20 DIAGNOSIS — R42 Dizziness and giddiness: Secondary | ICD-10-CM | POA: Diagnosis present

## 2021-02-20 HISTORY — PX: PACEMAKER LEADLESS INSERTION: EP1219

## 2021-02-20 LAB — HEMOGLOBIN A1C
Hgb A1c MFr Bld: 6.4 % — ABNORMAL HIGH (ref 4.8–5.6)
Mean Plasma Glucose: 137 mg/dL

## 2021-02-20 LAB — BASIC METABOLIC PANEL
Anion gap: 10 (ref 5–15)
BUN: 25 mg/dL — ABNORMAL HIGH (ref 8–23)
CO2: 24 mmol/L (ref 22–32)
Calcium: 8.8 mg/dL — ABNORMAL LOW (ref 8.9–10.3)
Chloride: 101 mmol/L (ref 98–111)
Creatinine, Ser: 0.99 mg/dL (ref 0.61–1.24)
GFR, Estimated: >60 mL/min (ref >60)
Glucose, Bld: 200 mg/dL — ABNORMAL HIGH (ref 70–99)
Potassium: 4.4 mmol/L (ref 3.5–5.1)
Sodium: 135 mmol/L (ref 135–145)

## 2021-02-20 LAB — GLUCOSE, CAPILLARY
Glucose-Capillary: 211 mg/dL — ABNORMAL HIGH (ref 70–99)
Glucose-Capillary: 196 mg/dL — ABNORMAL HIGH (ref 70–99)
Glucose-Capillary: 184 mg/dL — ABNORMAL HIGH (ref 70–99)
Glucose-Capillary: 168 mg/dL — ABNORMAL HIGH (ref 70–99)
Glucose-Capillary: 154 mg/dL — ABNORMAL HIGH (ref 70–99)
Glucose-Capillary: 246 mg/dL — ABNORMAL HIGH (ref 70–99)

## 2021-02-20 LAB — URINE CULTURE: Culture: >=100000 — AB

## 2021-02-20 SURGERY — PACEMAKER LEADLESS INSERTION
Anesthesia: Moderate Sedation

## 2021-02-20 MED ORDER — HEPARIN (PORCINE) IN NACL 1000-0.9 UT/500ML-% IV SOLN
INTRAVENOUS | Status: AC
  Filled 2021-02-20: qty 1000

## 2021-02-20 MED ORDER — SODIUM CHLORIDE 0.9% FLUSH
3.0000 mL | Freq: Two times a day (BID) | INTRAVENOUS | Status: DC
  Administered 2021-02-20 (×2): 3 mL via INTRAVENOUS

## 2021-02-20 MED ORDER — MIDAZOLAM HCL 2 MG/2ML IJ SOLN
INTRAMUSCULAR | Status: AC
  Filled 2021-02-20: qty 2

## 2021-02-20 MED ORDER — HEPARIN SODIUM (PORCINE) 1000 UNIT/ML IJ SOLN
INTRAMUSCULAR | Status: AC
  Filled 2021-02-20: qty 1

## 2021-02-20 MED ORDER — HEPARIN (PORCINE) IN NACL 1000-0.9 UT/500ML-% IV SOLN
INTRAVENOUS | Status: DC | PRN
  Administered 2021-02-20: 1000 mL

## 2021-02-20 MED ORDER — DILTIAZEM HCL ER COATED BEADS 240 MG PO CP24
240.0000 mg | ORAL_CAPSULE | Freq: Every day | ORAL | Status: DC
  Administered 2021-02-20 – 2021-02-21 (×2): 240 mg via ORAL
  Filled 2021-02-20: qty 2
  Filled 2021-02-20 (×2): qty 1
  Filled 2021-02-20 (×2): qty 2

## 2021-02-20 MED ORDER — METOPROLOL SUCCINATE ER 50 MG PO TB24
50.0000 mg | ORAL_TABLET | Freq: Every day | ORAL | Status: DC
  Administered 2021-02-20 – 2021-02-21 (×2): 50 mg via ORAL
  Filled 2021-02-20 (×2): qty 1

## 2021-02-20 MED ORDER — ACETAMINOPHEN 325 MG PO TABS: 650.0000 mg | ORAL_TABLET | ORAL | Status: DC | PRN

## 2021-02-20 MED ORDER — ONDANSETRON HCL 4 MG/2ML IJ SOLN: 4.0000 mg | Freq: Four times a day (QID) | INTRAMUSCULAR | Status: DC | PRN

## 2021-02-20 MED ORDER — MIDAZOLAM HCL 2 MG/2ML IJ SOLN
INTRAMUSCULAR | Status: DC | PRN
  Administered 2021-02-20: 1 mg via INTRAVENOUS

## 2021-02-20 MED ORDER — INSULIN GLARGINE 100 UNIT/ML ~~LOC~~ SOLN
35.0000 [IU] | Freq: Every day | SUBCUTANEOUS | Status: DC
  Administered 2021-02-21: 35 [IU] via SUBCUTANEOUS
  Filled 2021-02-20: qty 0.35

## 2021-02-20 MED ORDER — SODIUM CHLORIDE 0.9% FLUSH: 3.0000 mL | INTRAVENOUS | Status: DC | PRN

## 2021-02-20 MED ORDER — SODIUM CHLORIDE 0.9 % IV SOLN: 250.0000 mL | INTRAVENOUS | Status: DC | PRN

## 2021-02-20 MED ORDER — HEPARIN SODIUM (PORCINE) 1000 UNIT/ML IJ SOLN
INTRAMUSCULAR | Status: DC | PRN
  Administered 2021-02-20: 5000 [IU] via INTRAVENOUS

## 2021-02-20 MED ORDER — FENTANYL CITRATE (PF) 100 MCG/2ML IJ SOLN
INTRAMUSCULAR | Status: DC | PRN
  Administered 2021-02-20: 50 ug via INTRAVENOUS

## 2021-02-20 MED ORDER — IOHEXOL 300 MG/ML  SOLN
INTRAMUSCULAR | Status: DC | PRN
  Administered 2021-02-20: 20 mL

## 2021-02-20 MED ORDER — FENTANYL CITRATE (PF) 100 MCG/2ML IJ SOLN
INTRAMUSCULAR | Status: AC
  Filled 2021-02-20: qty 2

## 2021-02-20 SURGICAL SUPPLY — 16 items
DILATOR VESSEL 38 20CM 12FR (INTRODUCER) ×1 IMPLANT
DILATOR VESSEL 38 20CM 14FR (INTRODUCER) ×1 IMPLANT
DILATOR VESSEL 38 20CM 18FR (INTRODUCER) ×2 IMPLANT
DILATOR VESSEL 38 20CM 8FR (INTRODUCER) ×2 IMPLANT
DRAPE BRACHIAL (DRAPES) ×1 IMPLANT
MICRA AV TRANSCATH PACING SYS (Pacemaker) ×2 IMPLANT
MICRA INTRODUCER SHEATH (SHEATH) ×2
NDL PERC 18GX7CM (NEEDLE) IMPLANT
NEEDLE PERC 18GX7CM (NEEDLE) ×2 IMPLANT
PACK CARDIAC CATH (CUSTOM PROCEDURE TRAY) ×2 IMPLANT
PAD ELECT DEFIB RADIOL ZOLL (MISCELLANEOUS) ×1 IMPLANT
PANNUS RETENTION SYSTEM 2 PAD (MISCELLANEOUS) ×2 IMPLANT
SHEATH AVANTI 7FRX11 (SHEATH) ×1 IMPLANT
SHEATH INTRODUCER MICRA (SHEATH) IMPLANT
SYSTEM PACING TRNSCTH AV MICRA (Pacemaker) IMPLANT
WIRE AMPLATZ SS-J .035X180CM (WIRE) ×1 IMPLANT

## 2021-02-20 NOTE — Progress Notes (Signed)
Inpatient Diabetes Program Recommendations  AACE/ADA: New Consensus Statement on Inpatient Glycemic Control (2015)  Target Ranges:  Prepandial:   less than 140 mg/dL      Peak postprandial:   less than 180 mg/dL (1-2 hours)      Critically ill patients:  140 - 180 mg/dL   Lab Results  Component Value Date   GLUCAP 168 (H) 02/20/2021   HGBA1C 6.4 (H) 02/17/2021    Review of Glycemic Control Results for ASAHD, CAN (MRN [id number]) as of 02/20/2021 14:16  Ref. Range 02/19/2021 07:37 02/19/2021 12:00 02/19/2021 16:27 02/19/2021 21:16 02/20/2021 05:52 02/20/2021 07:36 02/20/2021 09:45 02/20/2021 13:09  Glucose-Capillary Latest Ref Range: 70 - 99 mg/dL 921 (H) 194 (H) 174 (H) 215 (H) 211 (H) 196 (H) 184 (H) 168 (H)   Diabetes history: DM 2 Outpatient Diabetes medications: 70/30 40 units qam, 60 nits qpm, metformin 1000 mg daily, trulicity 0.5 mg weekly A1c 6.4% on 5/29 Current orders for Inpatient glycemic control:  Lantus 30 units Daily, nov 0-15 units tid + hs  Inpatient Diabetes Program Recommendations:   If pt remains in hospital consider the following -  Increase Lantus to 35 units  Thanks,  Christena Deem RN, MSN, BC-ADM Inpatient Diabetes Coordinator Team Pager [phone] (8a-5p)

## 2021-02-20 NOTE — Progress Notes (Signed)
Gave pacemaker card and manufacturer booklet to patient's wife at bedside post-procedure.

## 2021-02-20 NOTE — Progress Notes (Signed)
PROGRESS NOTE    Harry [name]  UEA:[id number] DOB: 10-29-1940 DOA: 02/17/2021 PCP: Kandyce Rud, MD  Outpatient Specialists: [facility]    Brief Narrative:   Harry [name] is a 80 y.o. male with medical history significant for DM 2, paroxysmal A. fib on Eliquis, HTN, CAD, mild aortic stenosis, presenting with a 3-week history of episodic dizziness and lightheadedness without precipitating or aggravating factors.  Was evaluated in the ER and hydrated but symptoms continued.  Had a Holter monitor for 2 weeks but interpretation still pending.  On the day of arrival, he was awoken by an episode of feeling swimmy in the head and presented to the emergency room for evaluation.  He denies associated chest pain, shortness of breath or palpitations.  He has had no cough fever or chills, no nausea, vomiting, abdominal pain or change in bowel habits and no dysuria.  Denies visual disturbance, one-sided numbness weakness or tingling.    Assessment & Plan:   Principal Problem:   Symptomatic sinus bradycardia Active Problems:   Aortic stenosis, mild   Coronary artery disease involving native coronary artery of native heart without angina pectoris   DMII (diabetes mellitus, type 2) (HCC)   Essential hypertension   Obstructive sleep apnea   Paroxysmal atrial fibrillation (HCC)   Postural dizziness with presyncope   Sinus pause   Chronic anticoagulation   Pressure injury of skin     Symptomatic sinus bradycardia   Postural dizziness with presyncope   Sinus pause Continue Cardizem, lisinopril, metoprolol, statin - monitor on tele -s/p pacemaker placement today -Pending echo    Aortic stenosis, mild - No acute disease suspected at this time    Coronary artery disease involving native coronary artery of native heart without angina pectoris - Patient denies chest pain, EKG nonacute and troponin negative x2 - Continue home pravastatin and aspirin.  Metoprolol being held  due to bradycardia - eliquis on hold for procedure today.  We will resume at discharge if cardiology okay with it    DMII (diabetes mellitus, type 2) (HCC) - Sliding scale insulin coverage -Increase Lantus 35 units per diabetic nurse coordinator recommendation    Essential hypertension - Continue lisinopril, metop and dilt     Obstructive sleep apnea - CPAP qhs    Paroxysmal atrial fibrillation (HCC)   Chronic anticoagulation - holding eliquis for procedure today.  Will resume per cardiology  - Rate control agents being held due to symptomatic bradycardia   DVT prophylaxis: scds Code Status: full Family Communication: Daughter updated at bedside on 6/1  Level of care: Progressive Cardiac Status is: Observation  The patient remains OBS appropriate and will d/c before 2 midnights.  Dispo: The patient is from: Home              Anticipated d/c is to: Home              Patient currently is not medically stable to d/c.   Difficult to place patient No    Consultants:  cardiology  Procedures: none  Antimicrobials:  none    Subjective: Patient somnolent, just returned from pacemaker placement..  Patient denies any other symptoms.  Daughter at bedside  Objective: Vitals:   02/20/21 1130 02/20/21 1145 02/20/21 1515 02/20/21 1549  BP: 130/68 109/86 (!) 147/123 136/63  Pulse: 79 81 81 78  Resp: 17 17  16   Temp:    98.6 F (37 C)  TempSrc:    Oral  SpO2: 93% 93% 93% 96%  Weight:      Height:        Intake/Output Summary (Last 24 hours) at 02/20/2021 1624 Last data filed at 02/20/2021 1539 Gross per 24 hour  Intake 305.05 ml  Output 500 ml  Net -194.95 ml   Filed Weights   02/17/21 1542 02/20/21 0313  Weight: 102.5 kg 103.3 kg    Examination:  General exam: Appears calm and comfortable  Respiratory system: Clear to auscultation. Respiratory effort normal. Cardiovascular system: S1 & S2 heard, RRR. Mod holosystolic murmur. No edema, pacemaker insertion  site looks clean Gastrointestinal system: Abdomen is obese, soft and nontender. No organomegaly or masses felt. Normal bowel sounds heard. Central nervous system: Alert and oriented. No focal neurological deficits. Extremities: Symmetric 5 x 5 power. Skin: No rashes, lesions or ulcers Psychiatry: Judgement and insight appear normal. Mood & affect appropriate.     Data Reviewed: I have personally reviewed following labs and imaging studies  CBC: Recent Labs  Lab 02/17/21 1551  WBC 12.5*  NEUTROABS 7.5  HGB 11.4*  HCT 34.6*  MCV 80.8  PLT 276   Basic Metabolic Panel: Recent Labs  Lab 02/17/21 1551 02/19/21 0657 02/20/21 0548  NA 135 137 135  K 5.1 4.5 4.4  CL 102 102 101  CO2 21* 25 24  GLUCOSE 180* 164* 200*  BUN 17 23 25*  CREATININE 1.00 1.09 0.99  CALCIUM 8.7* 9.0 8.8*   GFR: Estimated Creatinine Clearance: 70.5 mL/min (by C-G formula based on SCr of 0.99 mg/dL). Liver Function Tests: Recent Labs  Lab 02/17/21 1551  AST 26  ALT 18  ALKPHOS 63  BILITOT 0.7  PROT 6.9  ALBUMIN 3.5   No results for input(s): LIPASE, AMYLASE in the last 168 hours. No results for input(s): AMMONIA in the last 168 hours. Coagulation Profile: No results for input(s): INR, PROTIME in the last 168 hours. Cardiac Enzymes: No results for input(s): CKTOTAL, CKMB, CKMBINDEX, TROPONINI in the last 168 hours. BNP (last 3 results) No results for input(s): PROBNP in the last 8760 hours. HbA1C: Recent Labs    02/17/21 2342  HGBA1C 6.4*   CBG: Recent Labs  Lab 02/19/21 2116 02/20/21 0552 02/20/21 0736 02/20/21 0945 02/20/21 1309  GLUCAP 215* 211* 196* 184* 168*   Lipid Profile: No results for input(s): CHOL, HDL, LDLCALC, TRIG, CHOLHDL, LDLDIRECT in the last 72 hours. Thyroid Function Tests: Recent Labs    02/17/21 2342  T3FREE 2.2   Anemia Panel: No results for input(s): VITAMINB12, FOLATE, FERRITIN, TIBC, IRON, RETICCTPCT in the last 72 hours. Urine analysis:     Component Value Date/Time   COLORURINE YELLOW (A) 02/17/2021 1541   APPEARANCEUR HAZY (A) 02/17/2021 1541   APPEARANCEUR Clear 01/29/2012 1600   LABSPEC 1.014 02/17/2021 1541   LABSPEC 1.010 01/29/2012 1600   PHURINE 5.0 02/17/2021 1541   GLUCOSEU 50 (A) 02/17/2021 1541   GLUCOSEU 50 mg/dL 28/31/5176 1607   HGBUR NEGATIVE 02/17/2021 1541   BILIRUBINUR NEGATIVE 02/17/2021 1541   BILIRUBINUR Negative 01/29/2012 1600   KETONESUR 5 (A) 02/17/2021 1541   PROTEINUR 30 (A) 02/17/2021 1541   NITRITE NEGATIVE 02/17/2021 1541   LEUKOCYTESUR MODERATE (A) 02/17/2021 1541   LEUKOCYTESUR Negative 01/29/2012 1600   Sepsis Labs: @LABRCNTIP (procalcitonin:4,lacticidven:4)  ) Recent Results (from the past 240 hour(s))  Urine Culture     Status: Abnormal   Collection Time: 02/17/21  3:41 PM   Specimen: Urine, Clean Catch  Result Value Ref Range Status   Specimen Description   Final  URINE, CLEAN CATCH Performed at [facility], [address] [place], [place] [address]    Special Requests   Final    NONE Performed at [facility], [address] [place], [place] [address]    Culture (A)  Final    >=100,000 COLONIES/mL CORYNEBACTERIUM MINUTISSIMUM Standardized susceptibility testing for this organism is not available. Performed at [facility], [address], [place], [place] [address]    Report Status 02/20/2021 FINAL  Final  SARS CORONAVIRUS 2 (TAT 6-24 HRS) Nasopharyngeal Nasopharyngeal Swab     Status: None   Collection Time: 02/17/21 11:42 PM   Specimen: Nasopharyngeal Swab  Result Value Ref Range Status   SARS Coronavirus 2 NEGATIVE NEGATIVE Final    Comment: (NOTE) SARS-CoV-2 target nucleic acids are NOT DETECTED.  The SARS-CoV-2 RNA is generally detectable in upper and lower respiratory specimens during the acute phase of infection. Negative results do not preclude SARS-CoV-2 infection, do not rule out co-infections with other pathogens, and  should not be used as the sole basis for treatment or other patient management decisions. Negative results must be combined with clinical observations, patient history, and epidemiological information. The expected result is Negative.  Fact Sheet for Patients: HairSlick.no  Fact Sheet for Healthcare Providers: quierodirigir.com  This test is not yet approved or cleared by the [place] FDA and  has been authorized for detection and/or diagnosis of SARS-CoV-2 by FDA under an Emergency Use Authorization (EUA). This EUA will remain  in effect (meaning this test can be used) for the duration of the COVID-19 declaration under Se ction 564(b)(1) of the Act, 21 U.S.C. section 360bbb-3(b)(1), unless the authorization is terminated or revoked sooner.  Performed at [facility], [address] [place], [place] [address]   Surgical PCR screen     Status: None   Collection Time: 02/19/21  3:00 PM   Specimen: Nasal Mucosa; Nasal Swab  Result Value Ref Range Status   MRSA, PCR NEGATIVE NEGATIVE Final   Staphylococcus aureus NEGATIVE NEGATIVE Final    Comment: (NOTE) The Xpert SA Assay (FDA approved for NASAL specimens in patients 20 years of age and older), is one component of a comprehensive surveillance program. It is not intended to diagnose infection nor to guide or monitor treatment. Performed at [facility], [address] [place], [place] [address]          Radiology Studies: EP PPM/ICD IMPLANT  Result Date: 02/20/2021 Successful Micra AV leadless pacemaker implantation       Scheduled Meds: . aspirin  81 mg Oral Daily  . diltiazem  240 mg Oral Daily  . insulin aspart  0-15 Units Subcutaneous TID WC  . insulin aspart  0-5 Units Subcutaneous QHS  . [START ON 02/21/2021] insulin glargine  35 Units Subcutaneous Daily  . lisinopril  5 mg Oral Daily  . metoprolol succinate  50 mg Oral Daily  .  multivitamin with minerals  1 tablet Oral Daily  . pravastatin  20 mg Oral Daily  . sodium chloride flush  3 mL Intravenous Q12H  . sodium chloride flush  3 mL Intravenous Q12H   Continuous Infusions: . sodium chloride       LOS: 0 days    Time spent: 35 min    Mirl Hillery Sherryll Burger, MD [facility]   If 7PM-7AM, please contact night-coverage [email or web address] Password [facility] 02/20/2021, 4:24 PM

## 2021-02-21 DIAGNOSIS — I455 Other specified heart block: Secondary | ICD-10-CM

## 2021-02-21 DIAGNOSIS — R55 Syncope and collapse: Secondary | ICD-10-CM

## 2021-02-21 DIAGNOSIS — R42 Dizziness and giddiness: Secondary | ICD-10-CM

## 2021-02-21 LAB — BASIC METABOLIC PANEL
Anion gap: 8 (ref 5–15)
BUN: 22 mg/dL (ref 8–23)
CO2: 25 mmol/L (ref 22–32)
Calcium: 8.7 mg/dL — ABNORMAL LOW (ref 8.9–10.3)
Chloride: 101 mmol/L (ref 98–111)
Creatinine, Ser: 0.93 mg/dL (ref 0.61–1.24)
GFR, Estimated: >60 mL/min (ref >60)
Glucose, Bld: 198 mg/dL — ABNORMAL HIGH (ref 70–99)
Potassium: 4.5 mmol/L (ref 3.5–5.1)
Sodium: 134 mmol/L — ABNORMAL LOW (ref 135–145)

## 2021-02-21 LAB — CBC
HCT: 34.7 % — ABNORMAL LOW (ref 39.0–52.0)
Hemoglobin: 11.5 g/dL — ABNORMAL LOW (ref 13.0–17.0)
MCH: 26.3 pg (ref 26.0–34.0)
MCHC: 33.1 g/dL (ref 30.0–36.0)
MCV: 79.4 fL — ABNORMAL LOW (ref 80.0–100.0)
Platelets: 263 10*3/uL (ref 150–400)
RBC: 4.37 MIL/uL (ref 4.22–5.81)
RDW: 17.2 % — ABNORMAL HIGH (ref 11.5–15.5)
WBC: 11.5 10*3/uL — ABNORMAL HIGH (ref 4.0–10.5)
nRBC: 0 % (ref 0.0–0.2)

## 2021-02-21 LAB — ECHOCARDIOGRAM COMPLETE
AR max vel: 0.94 cm2
AV Area VTI: 0.85 cm2
AV Area mean vel: 0.89 cm2
AV Mean grad: 23.8 mmHg
AV Peak grad: 35 mmHg
Ao pk vel: 2.96 m/s
Area-P 1/2: 2.42 cm2
Height: 67 in
S' Lateral: 2.8 cm
Weight: 3616 oz

## 2021-02-21 LAB — GLUCOSE, CAPILLARY: Glucose-Capillary: 297 mg/dL — ABNORMAL HIGH (ref 70–99)

## 2021-02-21 MED ORDER — ELIQUIS 5 MG PO TABS: 1.0000 | ORAL_TABLET | Freq: Two times a day (BID) | ORAL | Status: AC

## 2021-02-21 NOTE — Progress Notes (Signed)
[facility]    SUBJECTIVE: The patient reports feeling very well. He denies insertion site pain or drainage. He is eager to go home.    Vitals:   02/20/21 1700 02/20/21 2001 02/21/21 0445 02/21/21 0634  BP: 129/69 (!) 116/48 131/61   Pulse: 77 72 68   Resp: 17 17 16    Temp: 98.6 F (37 C) 98.2 F (36.8 C) 98.3 F (36.8 C)   TempSrc: Oral     SpO2:  94% 96%   Weight:    103.8 kg  Height:         Intake/Output Summary (Last 24 hours) at 02/21/2021 0843 Last data filed at 02/20/2021 1539 Gross per 24 hour  Intake 38.01 ml  Output --  Net 38.01 ml      PHYSICAL EXAM  General: Well developed, well nourished, in no acute distress, sitting up in bed eating breakfast HEENT:  Normocephalic and atramatic Neck:  No JVD.  Lungs: normal effort of breathing on room air Heart: regular rate and rhythm Msk:  Back normal, normal gait. Normal strength and tone for age. Extremities: No clubbing, cyanosis or edema.   Neuro: Alert and oriented X 3. Psych:  Good affect, responds appropriately Incision site: right groin without erythema, warmth, edema or active drainage. No obvious hematoma.     LABS: Basic Metabolic Panel: Recent Labs    02/20/21 0548 02/21/21 0504  NA 135 134*  K 4.4 4.5  CL 101 101  CO2 24 25  GLUCOSE 200* 198*  BUN 25* 22  CREATININE 0.99 0.93  CALCIUM 8.8* 8.7*   Liver Function Tests: No results for input(s): AST, ALT, ALKPHOS, BILITOT, PROT, ALBUMIN in the last 72 hours. No results for input(s): LIPASE, AMYLASE in the last 72 hours. CBC: Recent Labs    02/21/21 0504  WBC 11.5*  HGB 11.5*  HCT 34.7*  MCV 79.4*  PLT 263   Cardiac Enzymes: No results for input(s): CKTOTAL, CKMB, CKMBINDEX, TROPONINI in the last 72 hours. BNP: Invalid input(s): POCBNP D-Dimer: No results for input(s): DDIMER in the last 72 hours. Hemoglobin A1C: No results for input(s): HGBA1C in the last 72 hours. Fasting Lipid Panel: No results for input(s): CHOL, HDL,  LDLCALC, TRIG, CHOLHDL, LDLDIRECT in the last 72 hours. Thyroid Function Tests: No results for input(s): TSH, T4TOTAL, T3FREE, THYROIDAB in the last 72 hours.  Invalid input(s): FREET3 Anemia Panel: No results for input(s): VITAMINB12, FOLATE, FERRITIN, TIBC, IRON, RETICCTPCT in the last 72 hours.  EP PPM/ICD IMPLANT  Result Date: 02/20/2021 Successful Micra AV leadless pacemaker implantation  ECHOCARDIOGRAM COMPLETE  Result Date: 02/21/2021    ECHOCARDIOGRAM REPORT   Patient Name:   Harry [name] Date of Exam: 02/19/2021 Medical Rec #:  02/21/2021         Height:       67.0 in Accession #:    [id number]        Weight:       226.0 lb Date of Birth:  06/28/1941          BSA:          2.130 m Patient Age:    80 years          BP:           134/52 mmHg Patient Gender: M                 HR:           74 bpm. Exam Location:  [facility]  Procedure: 2D Echo, Cardiac Doppler and Color Doppler Indications:     I35.0 Aortic Stenosis  History:         Patient has no prior history of Echocardiogram examinations.                  Risk Factors:Hypertension and Diabetes. Paroxysmal atrial                  fibrillation. Coronary artery disease.  Sonographer:     Sedonia Small Rodgers-Jones Referring Phys:  [id number] Lyn Hollingshead PARASCHOS Diagnosing Phys: Alwyn Pea MD IMPRESSIONS  1. Left ventricular ejection fraction, by estimation, is 55 to 60%. The left ventricle has normal function. The left ventricle has no regional wall motion abnormalities. Left ventricular diastolic parameters are consistent with Grade I diastolic dysfunction (impaired relaxation).  2. Right ventricular systolic function is normal. The right ventricular size is normal.  3. The mitral valve is grossly normal. Trivial mitral valve regurgitation.  4. The aortic valve is calcified. There is mild calcification of the aortic valve. There is mild thickening of the aortic valve. Aortic valve regurgitation is not visualized. Mild aortic valve sclerosis is  present, with no evidence of aortic valve stenosis. FINDINGS  Left Ventricle: Left ventricular ejection fraction, by estimation, is 55 to 60%. The left ventricle has normal function. The left ventricle has no regional wall motion abnormalities. The left ventricular internal cavity size was normal in size. There is  no left ventricular hypertrophy. Left ventricular diastolic parameters are consistent with Grade I diastolic dysfunction (impaired relaxation). Right Ventricle: The right ventricular size is normal. No increase in right ventricular wall thickness. Right ventricular systolic function is normal. Left Atrium: Left atrial size was normal in size. Right Atrium: Right atrial size was normal in size. Pericardium: There is no evidence of pericardial effusion. Mitral Valve: The mitral valve is grossly normal. Trivial mitral valve regurgitation. Tricuspid Valve: The tricuspid valve is normal in structure. Tricuspid valve regurgitation is mild. Aortic Valve: The aortic valve is calcified. There is mild calcification of the aortic valve. There is mild thickening of the aortic valve. There is mild aortic valve annular calcification. Aortic valve regurgitation is not visualized. Mild aortic valve sclerosis is present, with no evidence of aortic valve stenosis. Aortic valve mean gradient measures 23.8 mmHg. Aortic valve peak gradient measures 35.0 mmHg. Aortic valve area, by VTI measures 0.85 cm. Pulmonic Valve: The pulmonic valve was normal in structure. Pulmonic valve regurgitation is trivial. Aorta: The ascending aorta was not well visualized. IAS/Shunts: No atrial level shunt detected by color flow Doppler.  LEFT VENTRICLE PLAX 2D LVIDd:         4.10 cm  Diastology LVIDs:         2.80 cm  LV e' medial:    4.24 cm/s LV PW:         1.40 cm  LV E/e' medial:  27.6 LV IVS:        1.40 cm  LV e' lateral:   5.87 cm/s LVOT diam:     2.20 cm  LV E/e' lateral: 19.9 LV SV:         47 LV SV Index:   22 LVOT Area:     3.80 cm   RIGHT VENTRICLE RV Basal diam:  4.10 cm RV S prime:     17.10 cm/s TAPSE (M-mode): 1.8 cm LEFT ATRIUM             Index  RIGHT ATRIUM           Index LA diam:        4.10 cm 1.92 cm/m  RA Area:     14.90 cm LA Vol (A2C):   42.6 ml 20.00 ml/m RA Volume:   39.90 ml  18.73 ml/m LA Vol (A4C):   47.6 ml 22.35 ml/m LA Biplane Vol: 47.1 ml 22.11 ml/m  AORTIC VALVE AV Area (Vmax):    0.94 cm AV Area (Vmean):   0.89 cm AV Area (VTI):     0.85 cm AV Vmax:           296.00 cm/s AV Vmean:          235.500 cm/s AV VTI:            0.553 m AV Peak Grad:      35.0 mmHg AV Mean Grad:      23.8 mmHg LVOT Vmax:         73.20 cm/s LVOT Vmean:        54.850 cm/s LVOT VTI:          0.124 m LVOT/AV VTI ratio: 0.22  AORTA Ao Root diam: 4.00 cm Ao Asc diam:  3.40 cm MITRAL VALVE MV Area (PHT): 2.42 cm     SHUNTS MV Decel Time: 313 msec     Systemic VTI:  0.12 m MV E velocity: 117.00 cm/s  Systemic Diam: 2.20 cm MV A velocity: 147.00 cm/s MV E/A ratio:  0.80 Dwayne D Callwood MD Electronically signed by Alwyn Pea MD Signature Date/Time: 02/21/2021/8:21:49 AM    Final        TELEMETRY: sinus rhythm, 70s, RBBB  ASSESSMENT AND PLAN:  Principal Problem:   Symptomatic sinus bradycardia Active Problems:   Aortic stenosis, mild   Coronary artery disease involving native coronary artery of native 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  Intermittent pauses, secondary to sinus pauses, as well as high-grade AV block.  Patient had 3.6-second episode of high-grade AV block on 02/19/2021 at 2:14 AM which time patient was asymptomatic and asleep. 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   2.Coronary artery disease, status post DES distal RCA 11/06/2010, currently denies chest pain or shortness of breath, with normal high-sensitivity troponin 3.Paroxysmal atrial fibrillation, CHA2DS2-VASc score of 4, on Eliquis for stroke prevention 4.Mild aortic stenosis  Recommendations: 1. Resume Eliquis 5 mg BID and aspirin 81 mg tomorrow, 02/22/2021 2. Continue Cardizem and metoprolol for rate control 3.  Discharge today, and plan to follow up with Dr. Darrold Junker in 1 week. 4. After care instructions discussed with the patient and included in discharge paperwork.  Sign off for now; please call/Haiku with any questions.   Leanora Ivanoff, PA-C 02/21/2021 8:43 AM

## 2021-02-21 NOTE — Progress Notes (Signed)
Patient discharged at this time to home with all belongings. PIV/Tele removed by Clinical research associate. Verbalized understanding of discharge instructions per printed AVS. NAD noted upon departure.

## 2021-02-23 NOTE — Discharge Summary (Addendum)
[place] at [facility]   PATIENT NAME: Harry [name]    MR#:  [id number]  DATE OF BIRTH:  [birth year]  DATE OF ADMISSION:  02/17/2021   ADMITTING PHYSICIAN: Delfino Lovett, MD  DATE OF DISCHARGE: 02/21/2021 10:58 AM  PRIMARY CARE PHYSICIAN: Kandyce Rud, MD   ADMISSION DIAGNOSIS:  Dizziness [R42] Sinus pause [I45.5] Symptomatic sinus bradycardia [R00.1] Near syncope [R55] DISCHARGE DIAGNOSIS:  Principal Problem:   Symptomatic sinus bradycardia Active Problems:   Aortic stenosis, mild   Coronary artery disease involving native coronary artery of native heart without angina pectoris   DMII (diabetes mellitus, type 2) (HCC)   Essential hypertension   Obstructive sleep apnea   Paroxysmal atrial fibrillation (HCC)   Dizziness   Sinus pause   Chronic anticoagulation   Pressure injury of skin   Near syncope  SECONDARY DIAGNOSIS:   Past Medical History:  Diagnosis Date  . Aortic stenosis   . CAD (coronary artery disease)   . Diabetes (HCC)   . HTN (hypertension)   . PAF (paroxysmal atrial fibrillation) [facility])    HOSPITAL COURSE:  Harry [name] is a 80 y.o. male with medical history significant for DM 2, paroxysmal A. fib on Eliquis, HTN, CAD, mild aortic stenosis admitted for 3-week history of episodic dizziness and lightheadedness without precipitating or aggravating factors.       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  - Resume Eliquis 5 mg BID and aspirin 81 mg tomorrow, 02/22/2021 - Continue Cardizem and metoprolol for rate control - outpt follow up with Dr. Darrold Junker in 1 week.    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       DMII (diabetes mellitus, type 2) (HCC) - Sliding scale insulin while in the hospital     Essential hypertension - Continue lisinopril, metoprolol and diltiazem     Obstructive sleep apnea - CPAP qhs     Paroxysmal atrial fibrillation (HCC)   Chronic anticoagulation  Sacral Pressure Ulcer: POA as below. Pressure Injury 02/18/21 Sacrum Mid;Bilateral Stage 1 -  Intact skin with non-blanchable redness of a localized area usually over a bony prominence. red and boggy (Active)  02/18/21 1100  Location: Sacrum  Location Orientation: Mid;Bilateral  Staging: Stage 1 -  Intact skin with non-blanchable redness of a localized area usually over a bony prominence.  Wound Description (Comments): red and boggy  Present on Admission: Yes   DISCHARGE CONDITIONS:  stable CONSULTS OBTAINED:    DRUG ALLERGIES:   Allergies  Allergen Reactions  . Atorvastatin     Other reaction(s): Muscle Pain  . Bupropion Other (See Comments)    Other reaction(s): Dizziness CNS side effects  . Citalopram     Other reaction(s): Other (See Comments) GI side effects  . Pioglitazone     Other reaction(s): Muscle Pain   DISCHARGE MEDICATIONS:   Allergies as of 02/21/2021       Reactions   Atorvastatin    Other reaction(s): Muscle Pain   Bupropion Other (See  Comments)   Other reaction(s): Dizziness CNS side effects   Citalopram    Other reaction(s): Other (See Comments) GI side effects   Pioglitazone    Other reaction(s): Muscle Pain        Medication List     STOP taking these medications    albuterol 108 (90 Base) MCG/ACT inhaler Commonly known as: VENTOLIN HFA   cyanocobalamin 1000 MCG/ML injection Commonly known as: (VITAMIN B-12)       TAKE these  medications    ASPIRIN 81 PO Take 1 tablet by mouth daily.   diltiazem 240 MG 24 hr capsule Commonly known as: CARDIZEM CD Take 240 mg by mouth daily.   Eliquis 5 MG Tabs tablet Generic drug: apixaban Take 1 tablet (5 mg total) by mouth 2 (two) times daily. What changed: how much to take   insulin NPH-regular Human (70-30) 100 UNIT/ML injection Inject 40-60 Units into the skin See admin instructions. Inject 40 units in the morning and 60 units in the evening.   lisinopril 5 MG tablet Commonly known as: ZESTRIL Take 1 tablet by mouth daily.   metFORMIN 1000 MG tablet Commonly known as: GLUCOPHAGE TAKE 1 (ONE) TABLET TWICE DAILY AS DIRECTED   metoprolol succinate 50 MG 24 hr tablet Commonly known as: TOPROL-XL Take 1 tablet by mouth daily.   nitroGLYCERIN 0.4 MG SL tablet Commonly known as: NITROSTAT Place under the tongue.   Omega-3 1000 MG Caps Take 1 tablet by mouth daily.   pravastatin 20 MG tablet Commonly known as: PRAVACHOL Take 1 tablet by mouth daily.   Trulicity 1.5 MG/0.5ML Sopn Generic drug: Dulaglutide SMARTSIG:0.5 Milliliter(s) SUB-Q Once a Week       DISCHARGE INSTRUCTIONS:   DIET:  Cardiac diet DISCHARGE CONDITION:  Stable ACTIVITY:  Activity as tolerated OXYGEN:  Home Oxygen: No.  Oxygen Delivery: room air DISCHARGE LOCATION:  home   If you experience worsening of your admission symptoms, develop shortness of breath, life threatening emergency, suicidal or homicidal thoughts you must seek medical attention immediately by calling 911 or calling your MD immediately  if symptoms less severe.  You Must read complete instructions/literature along with all the possible adverse reactions/side effects for all the Medicines you take and that have been prescribed to you. Take any new Medicines after you have completely understood and accpet all the possible adverse reactions/side effects.   Please note  You were cared for by a hospitalist during  your hospital stay. If you have any questions about your discharge medications or the care you received while you were in the hospital after you are discharged, you can call the unit and asked to speak with the hospitalist on call if the hospitalist that took care of you is not available. Once you are discharged, your primary care physician will handle any further medical issues. Please note that NO REFILLS for any discharge medications will be authorized once you are discharged, as it is imperative that you return to your primary care physician (or establish a relationship with a primary care physician if you do not have one) for your aftercare needs so that they can reassess your need for medications and monitor your lab values.    On the day of Discharge:  VITAL SIGNS:  Blood pressure 131/61, pulse 68, temperature 98.3 F (36.8 C), resp. rate 16, height 5\' 7"  (1.702 m), weight 103.8 kg, SpO2 96 %. PHYSICAL EXAMINATION:  GENERAL:  80 y.o.-year-old patient lying in the bed with no acute distress.  EYES: Pupils equal, round, reactive to light and accommodation. No scleral icterus. Extraocular muscles intact.  HEENT: Head atraumatic, normocephalic. Oropharynx and nasopharynx clear.  NECK:  Supple, no jugular venous distention. No thyroid enlargement, no tenderness.  LUNGS: Normal breath sounds bilaterally, no wheezing, rales,rhonchi or crepitation. No use of accessory muscles of respiration.  CARDIOVASCULAR: S1, S2 normal. No murmurs, rubs, or gallops.  ABDOMEN: Soft, non-tender, non-distended. Bowel sounds present. No organomegaly or mass.  EXTREMITIES: No pedal edema, cyanosis, or clubbing.  NEUROLOGIC: Cranial nerves II through XII are intact. Muscle strength 5/5 in all extremities. Sensation intact. Gait not checked.  PSYCHIATRIC: The patient is alert and oriented x 3.  SKIN: No obvious rash, lesion, or ulcer.  DATA REVIEW:   CBC Recent Labs  Lab 02/21/21 0504  WBC 11.5*  HGB 11.5*   HCT 34.7*  PLT 263    Chemistries  Recent Labs  Lab 02/17/21 1551 02/19/21 0657 02/21/21 0504  NA 135   < > 134*  K 5.1   < > 4.5  CL 102   < > 101  CO2 21*   < > 25  GLUCOSE 180*   < > 198*  BUN 17   < > 22  CREATININE 1.00   < > 0.93  CALCIUM 8.7*   < > 8.7*  AST 26  --   --   ALT 18  --   --   ALKPHOS 63  --   --   BILITOT 0.7  --   --    < > = values in this interval not displayed.     Outpatient follow-up  Follow-up Information     Call Kandyce Rud, MD.   Specialty: Family Medicine Why: Office will call patient to schedule appointment. Contact information: [address] [facility] [place] [address] [phone]         Marcina Millard, MD. Go on 03/04/2021.   Specialty: Cardiology Why: @ 3:15pm Contact information: [address] [facility] [place] [address] [phone]                 30 Day Unplanned Readmission Risk Score    Flowsheet Row ED to Hosp-Admission (Discharged) from 02/17/2021 in [facility]U  30 Day Unplanned Readmission Risk Score (%) 14.29 Filed at 02/21/2021 1200       This score is the patient's risk of an unplanned readmission within 30 days of being discharged (0 -100%). The score is based on dignosis, age, lab data, medications, orders, and past utilization.   Low:  0-14.9   Medium: 15-21.9   High: 22-29.9   Extreme: 30 and above          Management plans discussed with the patient, family and they are in agreement.  CODE STATUS: Prior   TOTAL TIME TAKING CARE OF THIS PATIENT: 45 minutes.    Delfino Lovett M.D on 02/23/2021 at 9:50 AM  [facility]   CC: Primary care physician; Kandyce Rud, MD   Note: This dictation was prepared with Dragon dictation along with smaller phrase technology. Any transcriptional errors that result from this process are unintentional.

## 2021-04-01 ENCOUNTER — Encounter (HOSPITAL_COMMUNITY): Payer: Self-pay

## 2021-04-01 ENCOUNTER — Emergency Department (HOSPITAL_COMMUNITY): Payer: Medicare HMO

## 2021-04-01 ENCOUNTER — Other Ambulatory Visit: Payer: Self-pay

## 2021-04-01 ENCOUNTER — Inpatient Hospital Stay (HOSPITAL_COMMUNITY)
Admission: EM | Admit: 2021-04-01 | Discharge: 2021-04-04 | DRG: 690 | Disposition: A | Discharge status: Home Health | Payer: Medicare HMO | Attending: Student | Admitting: Student

## 2021-04-01 DIAGNOSIS — E11319 Type 2 diabetes mellitus with unspecified diabetic retinopathy without macular edema: Secondary | ICD-10-CM | POA: Diagnosis present

## 2021-04-01 DIAGNOSIS — N390 Urinary tract infection, site not specified: Principal | ICD-10-CM | POA: Diagnosis present

## 2021-04-01 DIAGNOSIS — R001 Bradycardia, unspecified: Secondary | ICD-10-CM

## 2021-04-01 DIAGNOSIS — I4510 Unspecified right bundle-branch block: Secondary | ICD-10-CM | POA: Diagnosis present

## 2021-04-01 DIAGNOSIS — D72825 Bandemia: Secondary | ICD-10-CM | POA: Diagnosis not present

## 2021-04-01 DIAGNOSIS — L89151 Pressure ulcer of sacral region, stage 1: Secondary | ICD-10-CM | POA: Diagnosis present

## 2021-04-01 DIAGNOSIS — Z8673 Personal history of transient ischemic attack (TIA), and cerebral infarction without residual deficits: Secondary | ICD-10-CM

## 2021-04-01 DIAGNOSIS — R471 Dysarthria and anarthria: Secondary | ICD-10-CM

## 2021-04-01 DIAGNOSIS — G9349 Other encephalopathy: Secondary | ICD-10-CM | POA: Diagnosis present

## 2021-04-01 DIAGNOSIS — I63412 Cerebral infarction due to embolism of left middle cerebral artery: Secondary | ICD-10-CM

## 2021-04-01 DIAGNOSIS — I2510 Atherosclerotic heart disease of native coronary artery without angina pectoris: Secondary | ICD-10-CM | POA: Diagnosis present

## 2021-04-01 DIAGNOSIS — I4430 Unspecified atrioventricular block: Secondary | ICD-10-CM | POA: Diagnosis present

## 2021-04-01 DIAGNOSIS — Z794 Long term (current) use of insulin: Secondary | ICD-10-CM

## 2021-04-01 DIAGNOSIS — Z6834 Body mass index (BMI) 34.0-34.9, adult: Secondary | ICD-10-CM

## 2021-04-01 DIAGNOSIS — N400 Enlarged prostate without lower urinary tract symptoms: Secondary | ICD-10-CM | POA: Diagnosis present

## 2021-04-01 DIAGNOSIS — N3090 Cystitis, unspecified without hematuria: Secondary | ICD-10-CM

## 2021-04-01 DIAGNOSIS — N179 Acute kidney failure, unspecified: Secondary | ICD-10-CM | POA: Diagnosis not present

## 2021-04-01 DIAGNOSIS — I6340 Cerebral infarction due to embolism of unspecified cerebral artery: Secondary | ICD-10-CM | POA: Insufficient documentation

## 2021-04-01 DIAGNOSIS — Z950 Presence of cardiac pacemaker: Secondary | ICD-10-CM

## 2021-04-01 DIAGNOSIS — R29898 Other symptoms and signs involving the musculoskeletal system: Secondary | ICD-10-CM

## 2021-04-01 DIAGNOSIS — R4701 Aphasia: Secondary | ICD-10-CM | POA: Diagnosis present

## 2021-04-01 DIAGNOSIS — R2990 Unspecified symptoms and signs involving the nervous system: Secondary | ICD-10-CM | POA: Diagnosis not present

## 2021-04-01 DIAGNOSIS — I495 Sick sinus syndrome: Secondary | ICD-10-CM | POA: Diagnosis present

## 2021-04-01 DIAGNOSIS — R29705 NIHSS score 5: Secondary | ICD-10-CM | POA: Diagnosis present

## 2021-04-01 DIAGNOSIS — I480 Paroxysmal atrial fibrillation: Secondary | ICD-10-CM | POA: Diagnosis present

## 2021-04-01 DIAGNOSIS — K219 Gastro-esophageal reflux disease without esophagitis: Secondary | ICD-10-CM

## 2021-04-01 DIAGNOSIS — E785 Hyperlipidemia, unspecified: Secondary | ICD-10-CM | POA: Diagnosis present

## 2021-04-01 DIAGNOSIS — Z888 Allergy status to other drugs, medicaments and biological substances status: Secondary | ICD-10-CM

## 2021-04-01 DIAGNOSIS — R300 Dysuria: Secondary | ICD-10-CM

## 2021-04-01 DIAGNOSIS — E119 Type 2 diabetes mellitus without complications: Secondary | ICD-10-CM

## 2021-04-01 DIAGNOSIS — E872 Acidosis: Secondary | ICD-10-CM | POA: Diagnosis present

## 2021-04-01 DIAGNOSIS — R319 Hematuria, unspecified: Secondary | ICD-10-CM | POA: Diagnosis present

## 2021-04-01 DIAGNOSIS — G629 Polyneuropathy, unspecified: Secondary | ICD-10-CM | POA: Diagnosis present

## 2021-04-01 DIAGNOSIS — Z8679 Personal history of other diseases of the circulatory system: Secondary | ICD-10-CM

## 2021-04-01 DIAGNOSIS — G4733 Obstructive sleep apnea (adult) (pediatric): Secondary | ICD-10-CM

## 2021-04-01 DIAGNOSIS — N401 Enlarged prostate with lower urinary tract symptoms: Secondary | ICD-10-CM

## 2021-04-01 DIAGNOSIS — R7989 Other specified abnormal findings of blood chemistry: Secondary | ICD-10-CM

## 2021-04-01 DIAGNOSIS — Z79899 Other long term (current) drug therapy: Secondary | ICD-10-CM

## 2021-04-01 DIAGNOSIS — G8929 Other chronic pain: Secondary | ICD-10-CM | POA: Diagnosis present

## 2021-04-01 DIAGNOSIS — Z7901 Long term (current) use of anticoagulants: Secondary | ICD-10-CM

## 2021-04-01 DIAGNOSIS — Z20822 Contact with and (suspected) exposure to covid-19: Secondary | ICD-10-CM | POA: Diagnosis present

## 2021-04-01 DIAGNOSIS — E1165 Type 2 diabetes mellitus with hyperglycemia: Secondary | ICD-10-CM | POA: Diagnosis present

## 2021-04-01 DIAGNOSIS — B961 Klebsiella pneumoniae [K. pneumoniae] as the cause of diseases classified elsewhere: Secondary | ICD-10-CM | POA: Diagnosis present

## 2021-04-01 DIAGNOSIS — R399 Unspecified symptoms and signs involving the genitourinary system: Secondary | ICD-10-CM

## 2021-04-01 DIAGNOSIS — I350 Nonrheumatic aortic (valve) stenosis: Secondary | ICD-10-CM | POA: Diagnosis present

## 2021-04-01 DIAGNOSIS — I10 Essential (primary) hypertension: Secondary | ICD-10-CM | POA: Diagnosis present

## 2021-04-01 DIAGNOSIS — I6521 Occlusion and stenosis of right carotid artery: Secondary | ICD-10-CM | POA: Diagnosis present

## 2021-04-01 DIAGNOSIS — R3915 Urgency of urination: Secondary | ICD-10-CM | POA: Diagnosis present

## 2021-04-01 DIAGNOSIS — E669 Obesity, unspecified: Secondary | ICD-10-CM | POA: Diagnosis present

## 2021-04-01 DIAGNOSIS — M549 Dorsalgia, unspecified: Secondary | ICD-10-CM | POA: Diagnosis present

## 2021-04-01 LAB — DIFFERENTIAL
Abs Immature Granulocytes: 0 10*3/uL (ref 0.00–0.07)
Basophils Absolute: 0.7 10*3/uL — ABNORMAL HIGH (ref 0.0–0.1)
Basophils Relative: 3 %
Eosinophils Absolute: 0 10*3/uL (ref 0.0–0.5)
Eosinophils Relative: 0 %
Lymphocytes Relative: 11 %
Lymphs Abs: 2.7 10*3/uL (ref 0.7–4.0)
Monocytes Absolute: 0.7 10*3/uL (ref 0.1–1.0)
Monocytes Relative: 3 %
Neutro Abs: 20.2 10*3/uL — ABNORMAL HIGH (ref 1.7–7.7)
Neutrophils Relative %: 83 %
nRBC: 0 /100 WBC

## 2021-04-01 LAB — CBC
HCT: 35.2 % — ABNORMAL LOW (ref 39.0–52.0)
Hemoglobin: 11.1 g/dL — ABNORMAL LOW (ref 13.0–17.0)
MCH: 26.3 pg (ref 26.0–34.0)
MCHC: 31.5 g/dL (ref 30.0–36.0)
MCV: 83.4 fL (ref 80.0–100.0)
Platelets: 258 10*3/uL (ref 150–400)
RBC: 4.22 MIL/uL (ref 4.22–5.81)
RDW: 17.3 % — ABNORMAL HIGH (ref 11.5–15.5)
WBC: 24.3 10*3/uL — ABNORMAL HIGH (ref 4.0–10.5)
nRBC: 0 % (ref 0.0–0.2)

## 2021-04-01 LAB — I-STAT CHEM 8, ED
BUN: 27 mg/dL — ABNORMAL HIGH (ref 8–23)
Calcium, Ion: 1.05 mmol/L — ABNORMAL LOW (ref 1.15–1.40)
Chloride: 105 mmol/L (ref 98–111)
Creatinine, Ser: 2 mg/dL — ABNORMAL HIGH (ref 0.61–1.24)
Glucose, Bld: 154 mg/dL — ABNORMAL HIGH (ref 70–99)
HCT: 35 % — ABNORMAL LOW (ref 39.0–52.0)
Hemoglobin: 11.9 g/dL — ABNORMAL LOW (ref 13.0–17.0)
Potassium: 4.8 mmol/L (ref 3.5–5.1)
Sodium: 136 mmol/L (ref 135–145)
TCO2: 19 mmol/L — ABNORMAL LOW (ref 22–32)

## 2021-04-01 LAB — RESP PANEL BY RT-PCR (FLU A&B, COVID) ARPGX2
Influenza A by PCR: NEGATIVE
Influenza B by PCR: NEGATIVE
SARS Coronavirus 2 by RT PCR: NEGATIVE

## 2021-04-01 LAB — COMPREHENSIVE METABOLIC PANEL
ALT: 14 U/L (ref 0–44)
AST: 22 U/L (ref 15–41)
Albumin: 3 g/dL — ABNORMAL LOW (ref 3.5–5.0)
Alkaline Phosphatase: 63 U/L (ref 38–126)
Anion gap: 14 (ref 5–15)
BUN: 26 mg/dL — ABNORMAL HIGH (ref 8–23)
CO2: 18 mmol/L — ABNORMAL LOW (ref 22–32)
Calcium: 8.4 mg/dL — ABNORMAL LOW (ref 8.9–10.3)
Chloride: 103 mmol/L (ref 98–111)
Creatinine, Ser: 2 mg/dL — ABNORMAL HIGH (ref 0.61–1.24)
GFR, Estimated: 34 mL/min — ABNORMAL LOW (ref >60)
Glucose, Bld: 158 mg/dL — ABNORMAL HIGH (ref 70–99)
Potassium: 4.9 mmol/L (ref 3.5–5.1)
Sodium: 135 mmol/L (ref 135–145)
Total Bilirubin: 1 mg/dL (ref 0.3–1.2)
Total Protein: 7 g/dL (ref 6.5–8.1)

## 2021-04-01 LAB — CBG MONITORING, ED
Glucose-Capillary: 164 mg/dL — ABNORMAL HIGH (ref 70–99)
Glucose-Capillary: 171 mg/dL — ABNORMAL HIGH (ref 70–99)

## 2021-04-01 LAB — APTT: aPTT: 38 seconds — ABNORMAL HIGH (ref 24–36)

## 2021-04-01 LAB — LACTIC ACID, PLASMA
Lactic Acid, Venous: 3.5 mmol/L (ref 0.5–1.9)
Lactic Acid, Venous: 4 mmol/L (ref 0.5–1.9)

## 2021-04-01 LAB — ETHANOL: Alcohol, Ethyl (B): <10 mg/dL (ref <10)

## 2021-04-01 LAB — PROTIME-INR
INR: 1.7 — ABNORMAL HIGH (ref 0.8–1.2)
Prothrombin Time: 20.4 seconds — ABNORMAL HIGH (ref 11.4–15.2)

## 2021-04-01 IMAGING — CT CT HEAD CODE STROKE
3 series · 14 of 47 positions shown, 16 images · IV contrast (omnipaque)
Comparison: CT head [DATE], MRI head [DATE], MRA head
[DATE]

CLINICAL DATA: Code stroke.  Aphasia

EXAM:
CT HEAD WITHOUT CONTRAST
CT ANGIOGRAPHY OF THE HEAD AND NECK
TECHNIQUE: Contiguous axial images were obtained from the base of the skull
through the vertex without intravenous contrast. Multidetector CT
imaging of the head and neck was performed using the standard
protocol during bolus administration of intravenous contrast.
Multiplanar CT image reconstructions and MIPs were obtained to
evaluate the vascular anatomy. Carotid stenosis measurements (when
applicable) are obtained utilizing NASCET criteria, using the distal
internal carotid diameter as the denominator.
CONTRAST:  60mL OMNIPAQUE IOHEXOL 350 MG/ML SOLN

[Series 2: head 5.0 h30s · axial · 0.42mm/px · z∈[-74,+56]mm · 8 of 32 slices shown, 10 images]
[im 3/32  brain]
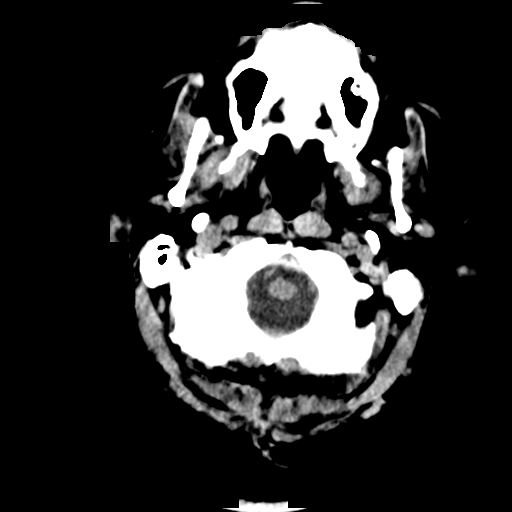
[im 3/32  bone]
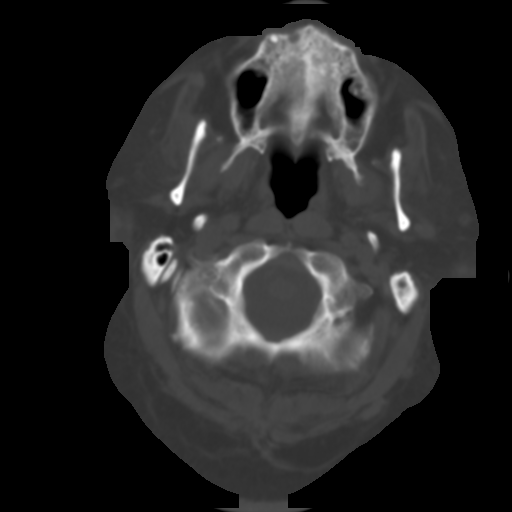
[im 7/32  brain]
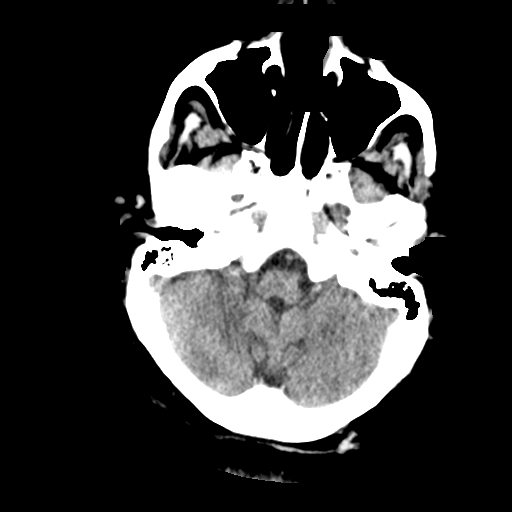
[im 10/32  brain]
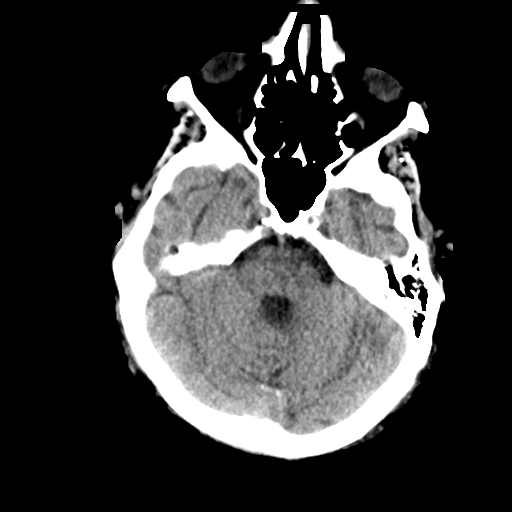
[im 14/32  brain]
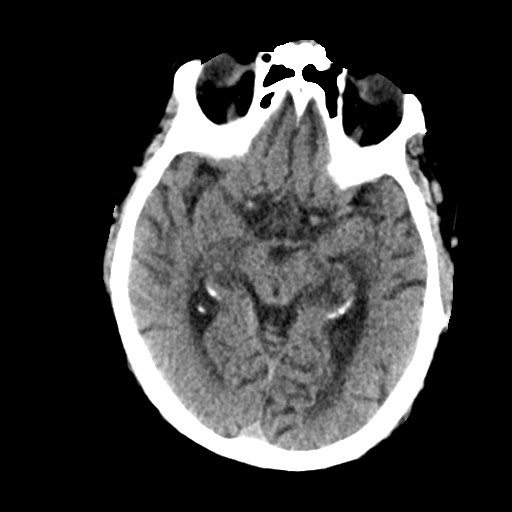
[im 18/32  brain]
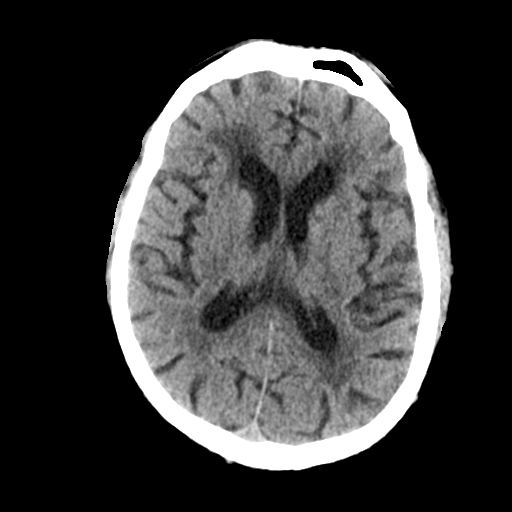
[im 18/32  bone]
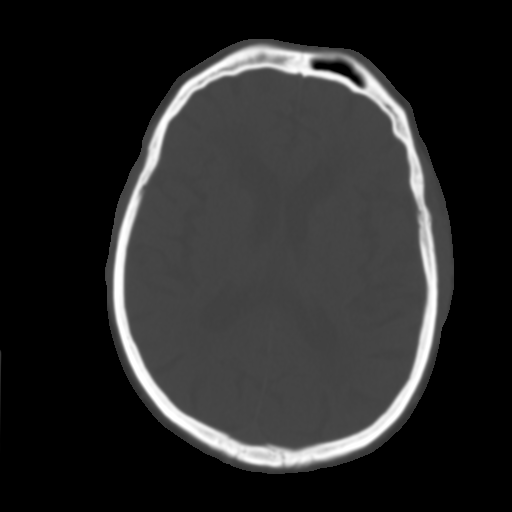
[im 22/32  brain]
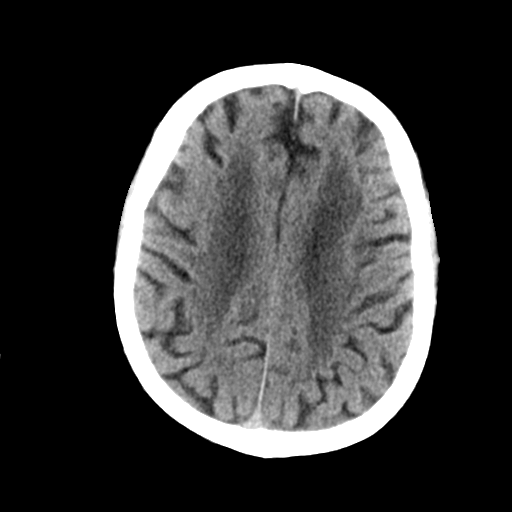
[im 25/32  brain]
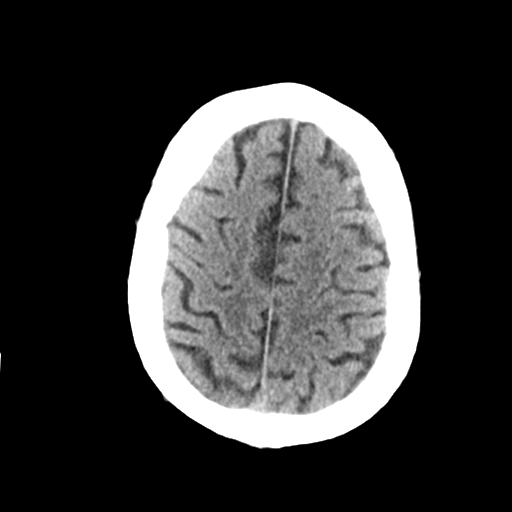
[im 29/32  brain]
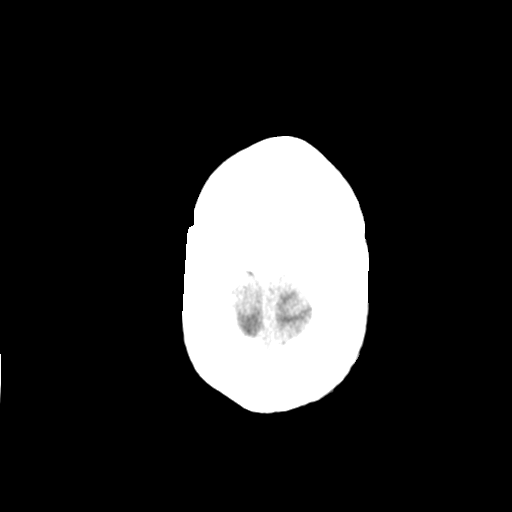

[Series 5: head 3.0 mpr cor · coronal · 0.35mm/px · 3 of 67 slices shown]
[im 23/67  brain]
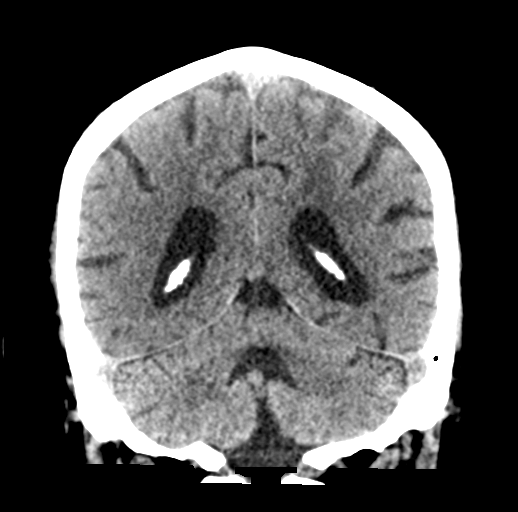
[im 30/67  brain]
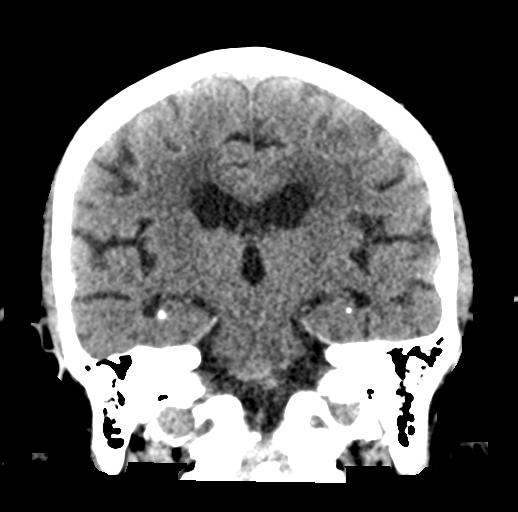
[im 37/67  brain]
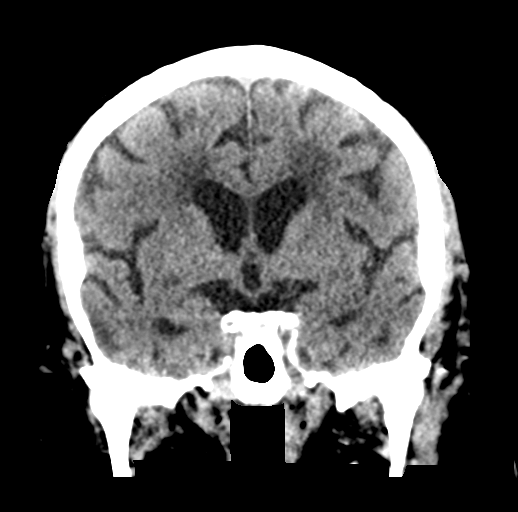

[Series 6: head 3.0 mpr sag · sagittal · 0.33mm/px · 3 of 57 slices shown]
[im 19/57  brain]
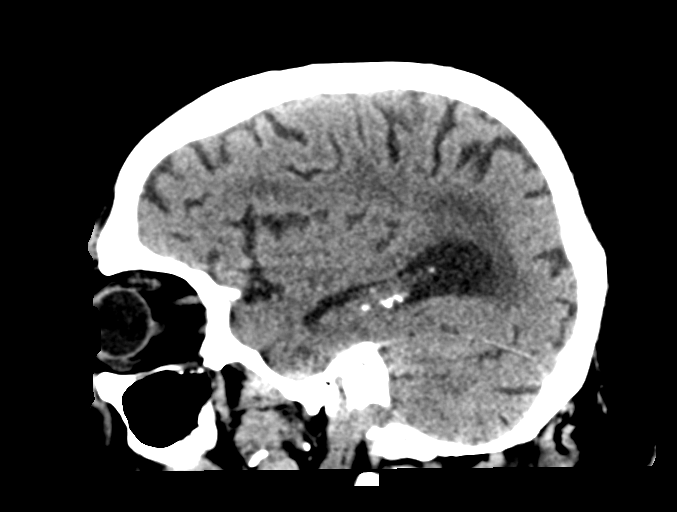
[im 29/57  brain]
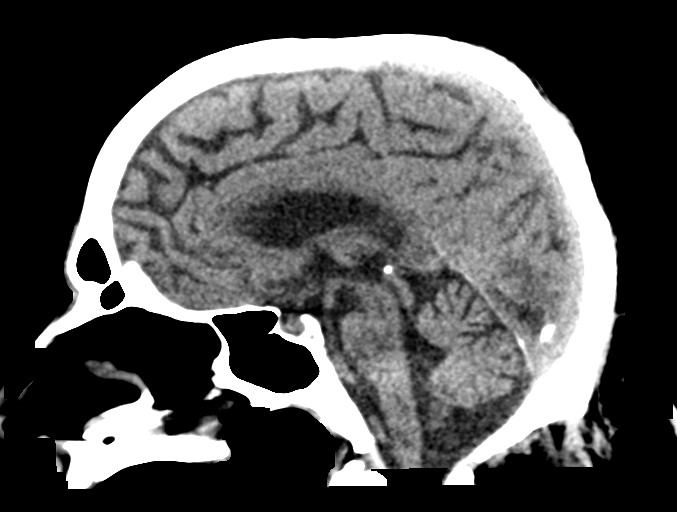
[im 38/57  brain]
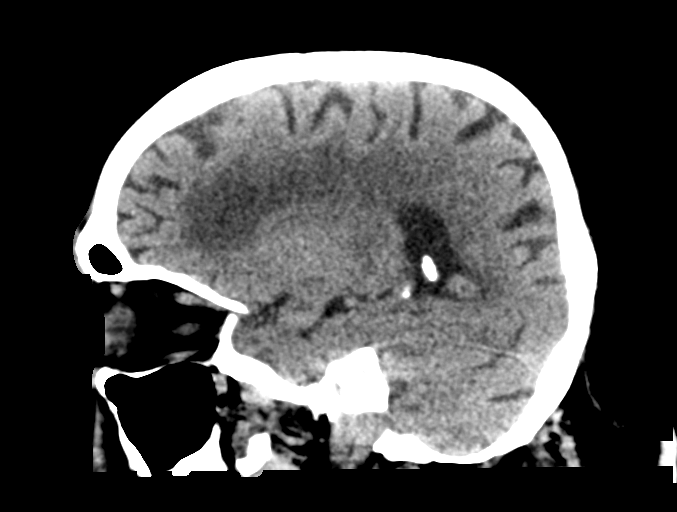

[14 of 47 positions shown; findings below may reference images not displayed]

FINDINGS: CT HEAD

Brain: There is no acute intracranial hemorrhage, mass effect, or
edema. Gray-white differentiation is preserved. Patchy and confluent
areas of hypoattenuation in the supratentorial white matter are
nonspecific but probably reflect moderate chronic microvascular
ischemic changes unchanged from prior. There is no extra-axial fluid
collection. Ventricles and sulci are stable in size and
configuration.

Vascular: No hyperdense vessel.

Skull: Calvarium is unremarkable.

Sinuses/Orbits: No acute finding.

Other: None.

Review of the MIP images confirms the above findings

CTA NECK

Aortic arch: Mild calcified plaque along the arch. There is direct
origin of the left vertebral artery from the arch. Calcified plaque
is present at the origin with apparent marked stenosis (there is
some streak artifact through this region) but no flow limitation.

Right carotid system: Patent. Mixed plaque at the external carotid
origin with high-grade stenosis. Trace calcified plaque along the
ICA origin without stenosis.

Left carotid system: Patent. Calcified plaque with high-grade
stenosis at the external carotid origin. Trace plaque at the ICA
origin without stenosis.

Vertebral arteries: Patent and codominant.

Skeleton: Degenerative changes of the cervical spine. Bulky anterior
osteophytes.

Other neck: Unremarkable.

Upper chest: Included upper lungs are clear.

Review of the MIP images confirms the above findings

CTA HEAD

Anterior circulation: Intracranial internal carotid arteries are
patent with calcified plaque along cavernous and proximal
supraclinoid portions causing mild to moderate stenosis, right
greater than left. Anterior and middle cerebral arteries are patent.

Posterior circulation: Intracranial vertebral arteries are patent.
Basilar artery is patent. Major cerebellar artery origins are
patent. Posterior cerebral arteries are patent. Moderate stenosis of
inferior right distal P2 PCA branch.

Venous sinuses: Patent as allowed by contrast bolus timing.

Review of the MIP images confirms the above findings
IMPRESSION: There is no acute intracranial hemorrhage or evidence of acute
infarction. ASPECT score is 10.

No large vessel occlusion. Minimal plaque at the ICA origins without
stenosis. Intracranial ICA atherosclerosis with mild to moderate
stenosis. Marked stenosis of an inferior right P2 PCA branch.

These results were communicated to Dr. SAETERN at [DATE] on [DATE] by
text page via the [employer] messaging system.

## 2021-04-01 MED ORDER — PANTOPRAZOLE SODIUM 40 MG PO TBEC
40.0000 mg | DELAYED_RELEASE_TABLET | Freq: Every day | ORAL | Status: DC
  Administered 2021-04-02 – 2021-04-04 (×3): 40 mg via ORAL
  Filled 2021-04-01 (×3): qty 1

## 2021-04-01 MED ORDER — SODIUM CHLORIDE 0.9 % IV SOLN
2.0000 g | INTRAVENOUS | Status: DC
  Administered 2021-04-01 – 2021-04-03 (×3): 2 g via INTRAVENOUS
  Filled 2021-04-01 (×2): qty 20
  Filled 2021-04-01: qty 2
  Filled 2021-04-01: qty 20

## 2021-04-01 MED ORDER — INSULIN ASPART 100 UNIT/ML IJ SOLN
0.0000 [IU] | Freq: Three times a day (TID) | INTRAMUSCULAR | Status: DC
  Administered 2021-04-02 (×2): 3 [IU] via SUBCUTANEOUS

## 2021-04-01 MED ORDER — ACETAMINOPHEN 160 MG/5ML PO SOLN: 650.0000 mg | ORAL | Status: DC | PRN

## 2021-04-01 MED ORDER — IOHEXOL 350 MG/ML SOLN
60.0000 mL | Freq: Once | INTRAVENOUS | Status: AC | PRN
  Administered 2021-04-01: 60 mL via INTRAVENOUS

## 2021-04-01 MED ORDER — ACETAMINOPHEN 325 MG PO TABS
650.0000 mg | ORAL_TABLET | ORAL | Status: DC | PRN
  Administered 2021-04-02: 650 mg via ORAL
  Filled 2021-04-01: qty 2

## 2021-04-01 MED ORDER — STROKE: EARLY STAGES OF RECOVERY BOOK
Freq: Once | Status: AC
  Filled 2021-04-01: qty 1

## 2021-04-01 MED ORDER — SODIUM CHLORIDE 0.9 % IV SOLN: INTRAVENOUS | Status: DC

## 2021-04-01 MED ORDER — SENNOSIDES-DOCUSATE SODIUM 8.6-50 MG PO TABS: 1.0000 | ORAL_TABLET | Freq: Every evening | ORAL | Status: DC | PRN

## 2021-04-01 MED ORDER — INSULIN ASPART 100 UNIT/ML IJ SOLN: 0.0000 [IU] | Freq: Every day | INTRAMUSCULAR | Status: DC

## 2021-04-01 MED ORDER — SODIUM CHLORIDE 0.9 % IV SOLN: INTRAVENOUS | Status: AC

## 2021-04-01 MED ORDER — ACETAMINOPHEN 650 MG RE SUPP: 650.0000 mg | RECTAL | Status: DC | PRN

## 2021-04-01 NOTE — ED Triage Notes (Signed)
Wife left for the store around 1130 pt normal And upon returning noticed pt  could not get his words out

## 2021-04-01 NOTE — Consult Note (Signed)
Stroke Neurology Consultation Note  Consult Requested by: Dr. Lynelle Doctor  Reason for Consult: Aphasia  Consult Date: 04/01/21   The history was obtained from the patient and EMS.  During history and examination, all items were able to obtain unless otherwise noted.  History of Present Illness:  Harry [name] is a 80 y.o. Caucasian male with PMH of hypertension, hyperlipidemia, OSA, A. fib on Eliquis, CAD, diabetes, SSS with pacemaker presenting to ED for aphasia and slurred speech.  Per EMS, patient wife reported last seen well 1130 AM he was sitting in chair talking normal.  Wife went to store and come back shortly found patient had slurred speech, not able to get words out.  No focal weakness or numbness, but felt generalized weakness.  EMS reported BP 123/42, glucose 167 and heart rates 40s in A. fib.  In ER, BP 150/62, glucose 140.  Per EMS, patient able to walk with walker at home, talking normally, ADL independent.  Patient stated that he has diabetic retinopathy, has regular eye injections for bad vision.  Patient stated he takes Eliquis at home, last dose this morning.  LSN: 11:30 AM tPA Given: No: On Eliquis  Past Medical History:  Diagnosis Date   Aortic stenosis    CAD (coronary artery disease)    Diabetes (HCC)    HTN (hypertension)    PAF (paroxysmal atrial fibrillation) (HCC)     Past Surgical History:  Procedure Laterality Date   PACEMAKER LEADLESS INSERTION N/A 02/20/2021   Procedure: PACEMAKER LEADLESS INSERTION;  Surgeon: Marcina Millard, MD;  Location: [facility] INVASIVE CV LAB;  Service: Cardiovascular;  Laterality: N/A;    No family history on file.  Social History:  reports that he has never smoked. He has never used smokeless tobacco. He reports that he does not drink alcohol and does not use drugs.  Allergies:  Allergies  Allergen Reactions   Atorvastatin     Other reaction(s): Muscle Pain   Bupropion Other (See Comments)    Other reaction(s):  Dizziness CNS side effects   Citalopram     Other reaction(s): Other (See Comments) GI side effects   Pioglitazone     Other reaction(s): Muscle Pain    No current facility-administered medications on file prior to encounter.   Current Outpatient Medications on File Prior to Encounter  Medication Sig Dispense Refill   apixaban (ELIQUIS) 5 MG TABS tablet Take 1 tablet (5 mg total) by mouth 2 (two) times daily. 60 tablet    ASPIRIN 81 PO Take 1 tablet by mouth daily.     diltiazem (CARDIZEM CD) 240 MG 24 hr capsule Take 240 mg by mouth daily.     insulin NPH-regular Human (70-30) 100 UNIT/ML injection Inject 40-60 Units into the skin See admin instructions. Inject 40 units in the morning and 60 units in the evening.     lisinopril (ZESTRIL) 5 MG tablet Take 1 tablet by mouth daily.     metFORMIN (GLUCOPHAGE) 1000 MG tablet TAKE 1 (ONE) TABLET TWICE DAILY AS DIRECTED     metoprolol succinate (TOPROL-XL) 50 MG 24 hr tablet Take 1 tablet by mouth daily.     nitroGLYCERIN (NITROSTAT) 0.4 MG SL tablet Place under the tongue.     Omega-3 1000 MG CAPS Take 1 tablet by mouth daily.     pravastatin (PRAVACHOL) 20 MG tablet Take 1 tablet by mouth daily.     TRULICITY 1.5 MG/0.5ML SOPN SMARTSIG:0.5 Milliliter(s) SUB-Q Once a Week      Review  of Systems: A full ROS was attempted today and was able to be performed.  Systems assessed include - Constitutional, Eyes, HENT, Respiratory, Cardiovascular, Gastrointestinal, Genitourinary, Integument/breast, Hematologic/lymphatic, Musculoskeletal, Neurological, Behavioral/Psych, Endocrine, Allergic/Immunologic - with pertinent responses as per HPI.  Physical Examination: Temp:  [98 F (36.7 C)] 98 F (36.7 C) (07/11 1548) Pulse Rate:  [41] 41 (07/11 1548) Resp:  [17] 17 (07/11 1548) BP: (124)/(50) 124/50 (07/11 1548) SpO2:  [95 %] 95 % (07/11 1548)  General - well nourished, well developed, in no apparent distress.    Ophthalmologic - fundi not  visualized due to noncooperation.    Cardiovascular - irregularly irregular heart rate and rhythm.  Mental Status -  Level of arousal and orientation to months, place, and age were intact, however, not orientated to year. Language exam showed moderate dysarthria, intermittent mild word finding difficulty and paraphasic errors, follows simple commands, preserved comprehension, able to name and repeat.  Cranial Nerves II - XII - II - Vision field exam showed possible right hemianopia, however this was complicated by severe visual difficulty due to diabetic retinopathy. III, IV, VI - Extraocular movements intact. V - Facial sensation intact bilaterally. VII - Facial movement intact bilaterally. VIII - Hearing & vestibular intact bilaterally. X - Palate elevates symmetrically. XI - Chin turning & shoulder shrug intact bilaterally. XII - Tongue protrusion intact.  Motor Strength - The patient's strength was normal in all extremities and pronator drift was absent except right lower extremity drift but not he will bed within 5 seconds due to right groin wound.   Motor Tone & Bulk - Muscle tone was assessed at the neck and appendages and was normal.  Bulk was normal and fasciculations were absent.   Reflexes - The patient's reflexes were normal in all extremities and he had no pathological reflexes.  Sensory - Light touch, temperature/pinprick were assessed and were normal.    Coordination - The patient had normal movements in the hands with no ataxia or dysmetria.  Tremor was absent.  Gait and Station - deferred  NIH Stroke Scale  Level Of Consciousness 0=Alert; keenly responsive 1=Arouse to minor stimulation 2=Requires repeated stimulation to arouse or movements to pain 3=postures or unresponsive 0  LOC Questions to Month and Age 34=Answers both questions correctly 1=Answers one question correctly or dysarthria/intubated/trauma/language barrier 2=Answers neither question correctly or  aphasia 0  LOC Commands      -Open/Close eyes     -Open/close grip     -Pantomime commands if communication barrier 0=Performs both tasks correctly 1=Performs one task correctly 2=Performs neighter task correctly 0  Best Gaze     -Only assess horizontal gaze 0=Normal 1=Partial gaze palsy 2=Forced deviation, or total gaze paresis 0  Visual 0=No visual loss 1=Partial hemianopia 2=Complete hemianopia 3=Bilateral hemianopia (blind including cortical blindness) 2  Facial Palsy     -Use grimace if obtunded 0=Normal symmetrical movement 1=Minor paralysis (asymmetry) 2=Partial paralysis (lower face) 3=Complete paralysis (upper and lower face) 0  Motor  0=No drift for 10/5 seconds 1=Drift, but does not hit bed 2=Some antigravity effort, hits  bed 3=No effort against gravity, limb falls 4=No movement 0=Amputation/joint fusion Right Arm 0     Leg 1    Left Arm 0     Leg 0  Limb Ataxia     - FNT/HTS 0=Absent or does not understand or paralyzed or amputation/joint fusion 1=Present in one limb 2=Present in two limbs 0  Sensory 0=Normal 1=Mild to moderate sensory loss 2=Severe to total  sensory loss or coma/unresponsive 0  Best Language 0=No aphasia, normal 1=Mild to moderate aphasia 2=Severe aphasia 3=Mute, global aphasia, or coma/unresponsive 1  Dysarthria 0=Normal 1=Mild to moderate 2=Severe, unintelligible or mute/anarthric 0=intubated/unable to test 1  Extinction/Neglect 0=No abnormality 1=visual/tactile/auditory/spatia/personal inattention/Extinction to bilateral simultaneous stimulation 2=Profound neglect/extinction more than 1 modality  0  Total   5     Data Reviewed: CT HEAD CODE STROKE WO CONTRAST  Result Date: 04/01/2021 CLINICAL DATA:  Code stroke.  Aphasia EXAM: CT HEAD WITHOUT CONTRAST CT ANGIOGRAPHY OF THE HEAD AND NECK TECHNIQUE: Contiguous axial images were obtained from the base of the skull through the vertex without intravenous contrast. Multidetector CT  imaging of the head and neck was performed using the standard protocol during bolus administration of intravenous contrast. Multiplanar CT image reconstructions and MIPs were obtained to evaluate the vascular anatomy. Carotid stenosis measurements (when applicable) are obtained utilizing NASCET criteria, using the distal internal carotid diameter as the denominator. CONTRAST:  29mL OMNIPAQUE IOHEXOL 350 MG/ML SOLN COMPARISON:  CT head 01/21/2021, MRI head 02/17/2021, MRA head 02/17/2021 FINDINGS: CT HEAD Brain: There is no acute intracranial hemorrhage, mass effect, or edema. Gray-white differentiation is preserved. Patchy and confluent areas of hypoattenuation in the supratentorial white matter are nonspecific but probably reflect moderate chronic microvascular ischemic changes unchanged from prior. There is no extra-axial fluid collection. Ventricles and sulci are stable in size and configuration. Vascular: No hyperdense vessel. Skull: Calvarium is unremarkable. Sinuses/Orbits: No acute finding. Other: None. Review of the MIP images confirms the above findings CTA NECK Aortic arch: Mild calcified plaque along the arch. There is direct origin of the left vertebral artery from the arch. Calcified plaque is present at the origin with apparent marked stenosis (there is some streak artifact through this region) but no flow limitation. Right carotid system: Patent. Mixed plaque at the external carotid origin with high-grade stenosis. Trace calcified plaque along the ICA origin without stenosis. Left carotid system: Patent. Calcified plaque with high-grade stenosis at the external carotid origin. Trace plaque at the ICA origin without stenosis. Vertebral arteries: Patent and codominant. Skeleton: Degenerative changes of the cervical spine. Bulky anterior osteophytes. Other neck: Unremarkable. Upper chest: Included upper lungs are clear. Review of the MIP images confirms the above findings CTA HEAD Anterior circulation:  Intracranial internal carotid arteries are patent with calcified plaque along cavernous and proximal supraclinoid portions causing mild to moderate stenosis, right greater than left. Anterior and middle cerebral arteries are patent. Posterior circulation: Intracranial vertebral arteries are patent. Basilar artery is patent. Major cerebellar artery origins are patent. Posterior cerebral arteries are patent. Moderate stenosis of inferior right distal P2 PCA branch. Venous sinuses: Patent as allowed by contrast bolus timing. Review of the MIP images confirms the above findings IMPRESSION: There is no acute intracranial hemorrhage or evidence of acute infarction. ASPECT score is 10. No large vessel occlusion. Minimal plaque at the ICA origins without stenosis. 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 Marked stenosis of an inferior right P2 PCA branch. These results were communicated to Dr. Roda Shutters at 3:36 pm on 04/01/2021 by text page via the [facility] messaging system. Electronically Signed   By: Guadlupe Spanish M.D.   On: 04/01/2021 15:49   CT ANGIO HEAD NECK W WO CM (CODE STROKE)  Result Date: 04/01/2021 CLINICAL DATA:  Code stroke.  Aphasia EXAM: CT HEAD WITHOUT CONTRAST CT ANGIOGRAPHY OF THE HEAD AND NECK TECHNIQUE: Contiguous axial images were obtained from the base of the skull through  the vertex without intravenous contrast. Multidetector CT imaging of the head and neck was performed using the standard protocol during bolus administration of intravenous contrast. Multiplanar CT image reconstructions and MIPs were obtained to evaluate the vascular anatomy. Carotid stenosis measurements (when applicable) are obtained utilizing NASCET criteria, using the distal internal carotid diameter as the denominator. CONTRAST:  60mL OMNIPAQUE IOHEXOL 350 MG/ML SOLN COMPARISON:  CT head 01/21/2021, MRI head 02/17/2021, MRA head 02/17/2021 FINDINGS: CT HEAD Brain: There is no acute intracranial hemorrhage, mass  effect, or edema. Gray-white differentiation is preserved. Patchy and confluent areas of hypoattenuation in the supratentorial white matter are nonspecific but probably reflect moderate chronic microvascular ischemic changes unchanged from prior. There is no extra-axial fluid collection. Ventricles and sulci are stable in size and configuration. Vascular: No hyperdense vessel. Skull: Calvarium is unremarkable. Sinuses/Orbits: No acute finding. Other: None. Review of the MIP images confirms the above findings CTA NECK Aortic arch: Mild calcified plaque along the arch. There is direct origin of the left vertebral artery from the arch. Calcified plaque is present at the origin with apparent marked stenosis (there is some streak artifact through this region) but no flow limitation. Right carotid system: Patent. Mixed plaque at the external carotid origin with high-grade stenosis. Trace calcified plaque along the ICA origin without stenosis. Left carotid system: Patent. Calcified plaque with high-grade stenosis at the external carotid origin. Trace plaque at the ICA origin without stenosis. Vertebral arteries: Patent and codominant. Skeleton: Degenerative changes of the cervical spine. Bulky anterior osteophytes. Other neck: Unremarkable. Upper chest: Included upper lungs are clear. Review of the MIP images confirms the above findings CTA HEAD Anterior circulation: Intracranial internal carotid arteries are patent with calcified plaque along cavernous and proximal supraclinoid portions causing mild to moderate stenosis, right greater than left. Anterior and middle cerebral arteries are patent. Posterior circulation: Intracranial vertebral arteries are patent. Basilar artery is patent. Major cerebellar artery origins are patent. Posterior cerebral arteries are patent. Moderate stenosis of inferior right distal P2 PCA branch. Venous sinuses: Patent as allowed by contrast bolus timing. Review of the MIP images confirms the  above findings IMPRESSION: There is no acute intracranial hemorrhage or evidence of acute infarction. ASPECT score is 10. No large vessel occlusion. Minimal plaque at the ICA origins without stenosis. 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 Marked stenosis of an inferior right P2 PCA branch. These results were communicated to Dr. Roda ShuttersXu at 3:36 pm on 04/01/2021 by text page via the [employer] messaging system. Electronically Signed   By: Guadlupe SpanishPraneil  Patel M.D.   On: 04/01/2021 15:49    Assessment: 80 y.o. male PMH of hypertension, hyperlipidemia, OSA, A. fib on Eliquis, CAD, diabetes, SSS with pacemaker presenting to ED for mild expressive aphasia and slurred speech.  Last known well 11:30 AM, NIH score 5 with mild expressive aphasia and moderate dysarthria.  Questionable right hemianopia but complicated with severe vision difficulty due to diabetic retinopathy.  Right lower extremity drift due to right groin nonhealing wound.  CT head no acute abnormality, CTA head and neck no LVO.  Patient not a tPA candidate given on Eliquis, last dose this morning.  Not IR candidate given no LVO.  Patient symptoms concerning for stroke.  Recommend MRI, 2D echo for stroke work-up.  Hold off Eliquis for now, until MRI done to rule out large infarct.  Right nonhealing groin wound per primary team.  Stroke Risk Factors - atrial fibrillation, diabetes mellitus, hyperlipidemia, and hypertension  Plan: Recommend  admission by intervention team for further stroke work up  Frequent neuro checks Telemetry monitoring MRI brain  Echocardiogram  UDS, fasting lipid panel and HgbA1C PT/OT/speech consult Permissive hypertension (only treat if BP > 180/105 given on Eliquis) for 24-48 hours post stroke onset GI and DVT prophylaxis  Hold all Eliquis for now until MRI done to rule out large infarct Right groin nonhealing wound per primary team Stroke risk factor modification Further evaluation as per inpatient  neurology recommendations  Discussed with Dr. Lynelle Doctor ED physician We will follow  Thank you for this consultation and allowing [place] to participate in the care of this patient.  Marvel Plan, MD PhD Stroke Neurology 04/01/2021 4:05 PM

## 2021-04-01 NOTE — Progress Notes (Signed)
Pt had a pacer placed implanted 02/20/21. There is typically a 6 week waiting period on pacers. The exam may have to wait until Wednesday or Thursday to get done. Msg sent to ordering.

## 2021-04-01 NOTE — H&P (Signed)
History and Physical    Harry GrovesRichard A Maue GUY:[id number] DOB: 12/11/1940 DOA: 04/01/2021  PCP: Kandyce RudBabaoff, Marcus, MD Patient coming from: Home.  Lives with his wife.  Uses walker  Chief Complaint: "I could not speak and could not raise my arms"  HPI: Harry GrovesRichard A [name] is a 80 y.o. 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  Patient was watching TV this morning when his wife left to go shopping.  The next thing he remembers was that his wife trying to make him talk.  He says he was able to understand but could not speak.  He also could not raise his arms bilaterally.  He denies acute vision change, headache, new numbness or tingling other than chronic neuropathy.  He has retinopathy for which he had gets monthly intraocular injection.  He denies chest pain but admits to intermittent "heartburn" mostly lying that improves with nitroglycerin.  He is currently chest pain-free.  He admits to occasional night sweat and chills but no fever.  He reports constipation over the last 2 days that has "eased off".  He also reports urinary urgency over the last 2 days.  He denies fever, new cough, nausea, vomiting, abdominal pain, dysuria, urinary frequency or new abdominal pain.  He denies palpitation, shortness of breath or leg swelling.  Denies new medication.  He was prescribed muscle relaxer and tramadol about 2 weeks ago but he stopped because he did not like the way it made him feel.  He reports good compliance with his Eliquis.  His last dose was this morning.  Denies NSAID or any over-the-counter pain medication.  He says he is fully vaccinated for COVID-19.  Patient lives with his wife.  Uses walker.  Denies smoking cigarette, drinking alcohol or recreational drug use.  He prefers to be full code.  In ED, bradycardic to 40s but improved to 70s.  SBP  ranges from 118-147.  DBP ranges from 46-129.  Normal saturation on RA.  Afebrile.  WBC 24.3 with left shift.  Hgb 11.1 (baseline). Cr/BUN 2.0/26.  Baseline Cr 0.9-1.0.  INR 1.7.  EtOH less than 10.  CTh without acute finding.  CTA head and neck negative for LVO but mild to moderate right ICA stenosis and marked stenosis of an inferior right P2 PCA branch.  Evaluated by neurology who recommended admission for routine CVA work-up.  COVID-19 screen pending.  EDP requested pacemaker interrogation.  ROS All review of system negative except for pertinent positives and negatives as history of present illness above.  PMH Past Medical History:  Diagnosis Date   Aortic stenosis    CAD (coronary artery disease)    Diabetes (HCC)    HTN (hypertension)    PAF (paroxysmal atrial fibrillation) (HCC)    PSH Past Surgical History:  Procedure Laterality Date   PACEMAKER LEADLESS INSERTION N/A 02/20/2021   Procedure: PACEMAKER LEADLESS INSERTION;  Surgeon: Marcina MillardParaschos, Alexander, MD;  Location: [facility] INVASIVE CV LAB;  Service: Cardiovascular;  Laterality: N/A;   Fam HX No family history on file.  Social Hx  reports that he has never smoked. He has never used smokeless tobacco. He reports that he does not drink alcohol and does not use drugs.  Allergy Allergies  Allergen Reactions   Atorvastatin     Other reaction(s): Muscle Pain   Bupropion Other (See  Comments)    Other reaction(s): Dizziness CNS side effects   Citalopram     Other reaction(s): Other (See Comments) GI side effects   Pioglitazone     Other reaction(s): Muscle Pain   Home Meds Prior to Admission medications   Medication Sig Start Date End Date Taking? Authorizing Provider  apixaban (ELIQUIS) 5 MG TABS tablet Take 1 tablet (5 mg total) by mouth 2 (two) times daily. 02/22/21   Delfino Lovett, MD  ASPIRIN 81 PO Take 1 tablet by mouth daily. 06/28/10   [provider]  diltiazem (CARDIZEM CD) 240 MG 24 hr capsule Take 240 mg by  mouth daily. 12/24/20   [provider]  insulin NPH-regular Human (70-30) 100 UNIT/ML injection Inject 40-60 Units into the skin See admin instructions. Inject 40 units in the morning and 60 units in the evening.    [provider]  lisinopril (ZESTRIL) 5 MG tablet Take 1 tablet by mouth daily. 06/29/20   [provider]  metFORMIN (GLUCOPHAGE) 1000 MG tablet TAKE 1 (ONE) TABLET TWICE DAILY AS DIRECTED 04/20/20   [provider]  metoprolol succinate (TOPROL-XL) 50 MG 24 hr tablet Take 1 tablet by mouth daily. 07/02/20   [provider]  nitroGLYCERIN (NITROSTAT) 0.4 MG SL tablet Place under the tongue. 11/27/17   [provider]  Omega-3 1000 MG CAPS Take 1 tablet by mouth daily. 12/27/10   [provider]  pravastatin (PRAVACHOL) 20 MG tablet Take 1 tablet by mouth daily. 06/29/20   [provider]  tiZANidine (ZANAFLEX) 2 MG tablet Take 2 mg by mouth 3 (three) times daily as needed. 03/13/21   [provider]  traMADol (ULTRAM) 50 MG tablet Take 50 mg by mouth every 6 (six) hours as needed. 03/19/21   [provider]  TRULICITY 1.5 MG/0.5ML SOPN SMARTSIG:0.5 Milliliter(s) SUB-Q Once a Week 10/01/20   [provider]    Physical Exam: Vitals:   04/01/21 1700 04/01/21 1730 04/01/21 1800 04/01/21 1830  BP: (!) 145/105 132/67 (!) 118/43 (!) 147/129  Pulse: 80 75 68 75  Resp: Temp:      TempSrc:      SpO2: 96% 96% 93% 95%    GENERAL: No acute distress.  Appears well.  HEENT: MMM.  Vision and hearing grossly intact.  NECK: Supple.  No apparent JVD.  RESP:  No IWOB. Good air movement bilaterally. CVS:  RRR to 70s.  2/6 SEM globally.  Heart sounds normal.  ABD/GI/GU: Bowel sounds present. Soft. Non tender.  No CVA tenderness. MSK/EXT:  Moves extremities. No apparent deformity or edema.  SKIN: Small skin erythema in right inguinal area without drainage or signs of abscess. NEURO: Awake,  alert and oriented appropriately. Speech clear. Cranial nerves II-XII grossly intact. Motor 5/5 in all muscle groups of UE and LE bilaterally, Normal tone. Light sensation intact in all dermatomes of upper and lower ext bilaterally. Patellar reflex symmetric.  No pronator drift.  Finger to nose intact. PSYCH: Calm. Normal affect.    Personally Reviewed Radiological Exams CT HEAD CODE STROKE WO CONTRAST  Result Date: 04/01/2021 CLINICAL DATA:  Code stroke.  Aphasia EXAM: CT HEAD WITHOUT CONTRAST CT ANGIOGRAPHY OF THE HEAD AND NECK TECHNIQUE: Contiguous axial images were obtained from the base of the skull through the vertex without intravenous contrast. Multidetector CT imaging of the head and neck was performed using the standard protocol during bolus administration of intravenous contrast. Multiplanar CT image reconstructions and  MIPs were obtained to evaluate the vascular anatomy. Carotid stenosis measurements (when applicable) are obtained utilizing NASCET criteria, using the distal internal carotid diameter as the denominator. CONTRAST:  100mL OMNIPAQUE IOHEXOL 350 MG/ML SOLN COMPARISON:  CT head 01/21/2021, MRI head 02/17/2021, MRA head 02/17/2021 FINDINGS: CT HEAD Brain: There is no acute intracranial hemorrhage, mass effect, or edema. Gray-white differentiation is preserved. Patchy and confluent areas of hypoattenuation in the supratentorial white matter are nonspecific but probably reflect moderate chronic microvascular ischemic changes unchanged from prior. There is no extra-axial fluid collection. Ventricles and sulci are stable in size and configuration. Vascular: No hyperdense vessel. Skull: Calvarium is unremarkable. Sinuses/Orbits: No acute finding. Other: None. Review of the MIP images confirms the above findings CTA NECK Aortic arch: Mild calcified plaque along the arch. There is direct origin of the left vertebral artery from the arch. Calcified plaque is present at the origin with apparent  marked stenosis (there is some streak artifact through this region) but no flow limitation. Right carotid system: Patent. Mixed plaque at the external carotid origin with high-grade stenosis. Trace calcified plaque along the ICA origin without stenosis. Left carotid system: Patent. Calcified plaque with high-grade stenosis at the external carotid origin. Trace plaque at the ICA origin without stenosis. Vertebral arteries: Patent and codominant. Skeleton: Degenerative changes of the cervical spine. Bulky anterior osteophytes. Other neck: Unremarkable. Upper chest: Included upper lungs are clear. Review of the MIP images confirms the above findings CTA HEAD Anterior circulation: Intracranial internal carotid arteries are patent with calcified plaque along cavernous and proximal supraclinoid portions causing mild to moderate stenosis, right greater than left. Anterior and middle cerebral arteries are patent. Posterior circulation: Intracranial vertebral arteries are patent. Basilar artery is patent. Major cerebellar artery origins are patent. Posterior cerebral arteries are patent. Moderate stenosis of inferior right distal P2 PCA branch. Venous sinuses: Patent as allowed by contrast bolus timing. Review of the MIP images confirms the above findings IMPRESSION: There is no acute intracranial hemorrhage or evidence of acute infarction. ASPECT score is 10. No large vessel occlusion. Minimal plaque at the ICA origins without stenosis. 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 Marked stenosis of an inferior right P2 PCA branch. These results were communicated to Dr. Roda Shutters at 3:36 pm on 04/01/2021 by text page via the [facility] messaging system. Electronically Signed   By: Guadlupe Spanish M.D.   On: 04/01/2021 15:49   CT ANGIO HEAD NECK W WO CM (CODE STROKE)  Result Date: 04/01/2021 CLINICAL DATA:  Code stroke.  Aphasia EXAM: CT HEAD WITHOUT CONTRAST CT ANGIOGRAPHY OF THE HEAD AND NECK TECHNIQUE:  Contiguous axial images were obtained from the base of the skull through the vertex without intravenous contrast. Multidetector CT imaging of the head and neck was performed using the standard protocol during bolus administration of intravenous contrast. Multiplanar CT image reconstructions and MIPs were obtained to evaluate the vascular anatomy. Carotid stenosis measurements (when applicable) are obtained utilizing NASCET criteria, using the distal internal carotid diameter as the denominator. CONTRAST:  82mL OMNIPAQUE IOHEXOL 350 MG/ML SOLN COMPARISON:  CT head 01/21/2021, MRI head 02/17/2021, MRA head 02/17/2021 FINDINGS: CT HEAD Brain: There is no acute intracranial hemorrhage, mass effect, or edema. Gray-white differentiation is preserved. Patchy and confluent areas of hypoattenuation in the supratentorial white matter are nonspecific but probably reflect moderate chronic microvascular ischemic changes unchanged from prior. There is no extra-axial fluid collection. Ventricles and sulci are stable in size and configuration. Vascular: No hyperdense vessel. Skull:  Calvarium is unremarkable. Sinuses/Orbits: No acute finding. Other: None. Review of the MIP images confirms the above findings CTA NECK Aortic arch: Mild calcified plaque along the arch. There is direct origin of the left vertebral artery from the arch. Calcified plaque is present at the origin with apparent marked stenosis (there is some streak artifact through this region) but no flow limitation. Right carotid system: Patent. Mixed plaque at the external carotid origin with high-grade stenosis. Trace calcified plaque along the ICA origin without stenosis. Left carotid system: Patent. Calcified plaque with high-grade stenosis at the external carotid origin. Trace plaque at the ICA origin without stenosis. Vertebral arteries: Patent and codominant. Skeleton: Degenerative changes of the cervical spine. Bulky anterior osteophytes. Other neck: Unremarkable.  Upper chest: Included upper lungs are clear. Review of the MIP images confirms the above findings CTA HEAD Anterior circulation: Intracranial internal carotid arteries are patent with calcified plaque along cavernous and proximal supraclinoid portions causing mild to moderate stenosis, right greater than left. Anterior and middle cerebral arteries are patent. Posterior circulation: Intracranial vertebral arteries are patent. Basilar artery is patent. Major cerebellar artery origins are patent. Posterior cerebral arteries are patent. Moderate stenosis of inferior right distal P2 PCA branch. Venous sinuses: Patent as allowed by contrast bolus timing. Review of the MIP images confirms the above findings IMPRESSION: There is no acute intracranial hemorrhage or evidence of acute infarction. ASPECT score is 10. No large vessel occlusion. Minimal plaque at the ICA origins without stenosis. 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 Marked stenosis of an inferior right P2 PCA branch. These results were communicated to Dr. Roda Shutters at 3:36 pm on 04/01/2021 by text page via the [facility] messaging system. Electronically Signed   By: Guadlupe Spanish M.D.   On: 04/01/2021 15:49     Personally Reviewed Labs: CBC: Recent Labs  Lab 04/01/21 1511 04/01/21 1517  WBC 24.3*  --   NEUTROABS 20.2*  --   HGB 11.1* 11.9*  HCT 35.2* 35.0*  MCV 83.4  --   PLT 258  --    Basic Metabolic Panel: Recent Labs  Lab 04/01/21 1511 04/01/21 1517  NA 135 136  K 4.9 4.8  CL 103 105  CO2 18*  --   GLUCOSE 158* 154*  BUN 26* 27*  CREATININE 2.00* 2.00*  CALCIUM 8.4*  --    GFR: Estimated Creatinine Clearance: 34.5 mL/min (A) (by C-G formula based on SCr of 2 mg/dL (H)). Liver Function Tests: Recent Labs  Lab 04/01/21 1511  AST 22  ALT 14  ALKPHOS 63  BILITOT 1.0  PROT 7.0  ALBUMIN 3.0*   No results for input(s): LIPASE, AMYLASE in the last 168 hours. No results for input(s): AMMONIA in the last 168  hours. Coagulation Profile: Recent Labs  Lab 04/01/21 1511  INR 1.7*   Cardiac Enzymes: No results for input(s): CKTOTAL, CKMB, CKMBINDEX, TROPONINI in the last 168 hours. BNP (last 3 results) No results for input(s): PROBNP in the last 8760 hours. HbA1C: No results for input(s): HGBA1C in the last 72 hours. CBG: Recent Labs  Lab 04/01/21 1508  GLUCAP 164*   Lipid Profile: No results for input(s): CHOL, HDL, LDLCALC, TRIG, CHOLHDL, LDLDIRECT in the last 72 hours. Thyroid Function Tests: No results for input(s): TSH, T4TOTAL, FREET4, T3FREE, THYROIDAB in the last 72 hours. Anemia Panel: No results for input(s): VITAMINB12, FOLATE, FERRITIN, TIBC, IRON, RETICCTPCT in the last 72 hours. Urine analysis:    Component Value Date/Time   COLORURINE  YELLOW (A) 02/17/2021 1541   APPEARANCEUR HAZY (A) 02/17/2021 1541   APPEARANCEUR Clear 01/29/2012 1600   LABSPEC 1.014 02/17/2021 1541   LABSPEC 1.010 01/29/2012 1600   PHURINE 5.0 02/17/2021 1541   GLUCOSEU 50 (A) 02/17/2021 1541   GLUCOSEU 50 mg/dL 56/43/3295 1884   HGBUR NEGATIVE 02/17/2021 1541   BILIRUBINUR NEGATIVE 02/17/2021 1541   BILIRUBINUR Negative 01/29/2012 1600   KETONESUR 5 (A) 02/17/2021 1541   PROTEINUR 30 (A) 02/17/2021 1541   NITRITE NEGATIVE 02/17/2021 1541   LEUKOCYTESUR MODERATE (A) 02/17/2021 1541   LEUKOCYTESUR Negative 01/29/2012 1600    Sepsis Labs:  Lactic acid pending.  Personally Reviewed EKG:  EKG normal sinus rhythm with bradycardia to 45.  Assessment/Plan Strokelike symptoms-had slurred speech and expressive aphasia that seems to have resolved.  His neuro exam is basically normal. CTH without acute finding.  CTA head and neck negative for LVO but mild to moderate right ICA stenosis and marked stenosis of an inferior right P2 PCA branch.  Not a candidate for tPA -Evaluated by neurology.  Appreciate recommendations -MRI brain without contrast, TTE, UDS, fasting lipid panel, A1c to complete CVA  work-up -Permissive hypertension to 180/105 for 24 to 48 hours post CVA onset -Hold Eliquis pending MRI -PT/OT/SLP -Continue telemetry monitoring and neurochecks -Pacemaker interrogation requested by EDP. -Has history of statin intolerance.  Only takes pravastatin.  He may benefit from PCSK9 inhibitors.  Leukocytosis/possible UTI: Patient has urinary urgency for 2 days.  Has leukocytosis but no fever.  -Urinalysis and urine culture -Blood culture to exclude bacteremia given recent procedure for pacemaker and right groin wound -Start IV ceftriaxone  Paroxysmal A. fib with bradycardia-CHA2DS2-VASc score 7.  On Cardizem, metoprolol and Eliquis  Sinus bradycardia and patient with high-grade AVB and PPM-had PPM on 02/20/2021. He states his PPM was set to pace when HR drops to below 43.  -EDP requested pacemaker interrogation-follow-up on this -Continue telemetry monitoring -Hold metoprolol, Cardizem and Eliquis   AKI/azotemia-unclear etiology of this.  Hemodynamically mediated?  Not on nephrotoxic meds Recent Labs    01/21/21 0022 02/17/21 1551 02/19/21 0657 02/20/21 0548 02/21/21 0504 04/01/21 1511 04/01/21 1517  BUN 21 17 23  25* 22 26* 27*  CREATININE 1.01 1.00 1.09 0.99 0.93 2.00* 2.00*  -Gentle IV fluid for the next 16 hours -Recheck renal function in the morning   Controlled IDDM-2 with neuropathy and hyperglycemia: A1c 6.4% on 5/29.  On 70/30-40 units in the morning and 60 units in the evening and metformin at home. Recent Labs  Lab 04/01/21 1508  GLUCAP 164*  -SSI-moderate -Add basal and meal coverage as appropriate -Continue pravastatin  OSA on CPAP -Nightly CPAP  GERD -P.o. Protonix  Chronic back pain: Stable.  No red flags. -As needed Tylenol  BPH with LUTS -Consider initiating Flomax prior to discharge  Home medication- -Please resume appropriate home medications once reconciled by pharmacy   DVT prophylaxis: SCD until we resume Eliquis Code Status:  Full code Family Communication: Updated patient's daughter at bedside  Disposition Plan: Admit to medical telemetry Consults called: Neurology Admission status: Observation Level of care: Telemetry Medical   06/02/21 MD [facility]  If 7PM-7AM, please contact night-coverage [email or web address]  04/01/2021, 6:53 PM

## 2021-04-01 NOTE — Code Documentation (Addendum)
Stroke Response Nurse Documentation Code Documentation  Harry [name] is a 80 y.o. male arriving to [facility] ED via [facility] on 04/01/2021 with past medical hx of recent PPM placement, atrial fibrillation, diabetes mellitus. Code stroke was activated by EMS. Patient from home where he was LKW at 1130 and now complaining of aphasia. On aspirin 81 mg daily and Eliquis (apixaban) daily. Stroke team at the bedside on patient arrival. Labs drawn and patient cleared for CT by Dr. Lynelle Doctor. Patient to CT with team. NIHSS 5, see documentation for details and code stroke times. Patient with right facial weakness, right hemianopia, right leg weakness, Global aphasia , and dysarthria  on exam. The following imaging was completed: CT, CTA head and neck. Patient is not a candidate for tPA due to pt being on Eliquis and Aspirin.   Care/Plan: q2h NIHSS and VS Bedside handoff with ED RN Harry [name].    Harry [name] Harry [name]  Rapid Response RN

## 2021-04-01 NOTE — ED Provider Notes (Addendum)
[facility] EMERGENCY DEPARTMENT Provider Note   CSN: [id number] Arrival date & time: 04/01/21  1506     History Chief Complaint  Patient presents with   Code Stroke    Harry [name] is a 80 y.o. male.  HPI  80 year old male PMHx aortic stenosis, paroxysmal A. fib (metoprolol, Cardizem, Eliquis last dose this a.m.), CAD, T2DM, HTN, retinopathy, symptomatic sinus bradycardia (s/p Micra pacer placement recently), BIP EMS as a code stroke for dysarthria, expressive aphasia, generalized weakness.  Last known normal approximately 1130 this morning.  Feels like her weakness has mostly improved, some persisting dysarthria and expressive aphasia per daughter.  Baseline poor vision 2/2 retinopathy for which he gets injections every month.  Daughter also notes that he has nighttime chest pains with increasing regularity that improved with nitroglycerin.  He has no prior history of ACS.  No chest pain at this time.  He additionally notes that he was also have a pacemaker monitor at home which he has not yet received.  No further medical concerns at this time including fevers, chills, diaphoresis, epistaxis, cough, shortness of breath, chest pain, pedal edema, abdominal pain, N/V, bowel/bladder changes, syncope, seizure, headache, focal paresthesias/weakness, rash.  History obtained from patient, daughter, and chart review.     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   Near syncope    Pressure injury of skin 02/20/2021   Symptomatic sinus bradycardia 02/17/2021   Dizziness 02/17/2021   Sinus pause 02/17/2021   Chronic anticoagulation 02/17/2021   BPH (benign prostatic hyperplasia)  01/21/2021   DDD (degenerative disc disease) 01/21/2021   Hypertensive retinopathy of both eyes 10/24/2018   Aortic stenosis, mild 07/06/2018   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) 12/07/2017   Systolic murmur 07/06/2017   Obstructive sleep apnea 03/05/2017   Paroxysmal atrial fibrillation (HCC) 03/05/2017   Coronary artery disease involving native coronary artery of native heart without angina pectoris 06/13/2014   Erectile dysfunction 06/13/2014   Essential hypertension 06/13/2014   Deficiency of other specified B group vitamins 04/24/2014   Hypertriglyceridemia 04/24/2014   Obesity 04/24/2014   Family history of colon cancer 03/06/2014   Insulin dependent type 2 diabetes mellitus, controlled (HCC) 05/01/2012   Pseudophakia, both eyes 05/01/2012    Past Surgical History:  Procedure Laterality Date   PACEMAKER LEADLESS INSERTION N/A 02/20/2021   Procedure: PACEMAKER LEADLESS INSERTION;  Surgeon: Marcina Millard, MD;  Location: [facility] INVASIVE CV LAB;  Service: Cardiovascular;  Laterality: N/A;       No family history on file.  Social History   Tobacco Use   Smoking status: Never   Smokeless tobacco: Never  Substance Use Topics   Alcohol use: No   Drug use: No    Home Medications Prior to Admission medications   Medication Sig Start Date End Date Taking? Authorizing Provider  apixaban (ELIQUIS) 5 MG TABS tablet Take 1 tablet (5 mg total) by mouth 2 (two) times daily. Patient taking differently: Take 5 mg by mouth 2 (two) times daily. 02/22/21  Yes Delfino Lovett, MD  ASPIRIN 81 PO Take 81 mg by mouth daily. 06/28/10  Yes [provider]  diphenhydramine-acetaminophen (TYLENOL PM) 25-500 MG TABS tablet Take 2 tablets by mouth at bedtime.   Yes [provider]  insulin NPH-regular Human (70-30) 100 UNIT/ML injection Inject 40-60 Units into the skin See admin instructions. Inject 40  units in the morning and 60 units in the evening.   Yes [provider]  metFORMIN (GLUCOPHAGE) 1000 MG tablet Take 1,000 mg by mouth 2 (two) times daily with a meal. 04/20/20  Yes [provider]  metoprolol succinate (TOPROL-XL) 50 MG 24 hr tablet Take 50 mg by mouth at bedtime. 07/02/20  Yes [provider]  Multiple Vitamins-Minerals (PRESERVISION/LUTEIN PO) Take 1 capsule by mouth daily.   Yes [provider]  nitroGLYCERIN (NITROSTAT) 0.4 MG SL tablet Place 0.4 mg under the tongue every 5 (five) minutes as needed for chest pain. 11/27/17  Yes [provider]  Omega-3 1000 MG CAPS Take 1,000 mg by mouth every evening. 12/27/10  Yes [provider]  pravastatin (PRAVACHOL) 20 MG tablet Take 20 mg by mouth daily. 06/29/20  Yes [provider]  traMADol (ULTRAM) 50 MG tablet Take 50 mg by mouth every 6 (six) hours as needed for moderate pain. 03/19/21  Yes [provider]  TRULICITY 1.5 MG/0.5ML SOPN Inject 1.5 mg into the skin See admin instructions. Every 10 days 10/01/20  Yes [provider]  cephALEXin (KEFLEX) 500 MG capsule Take 500 mg by mouth See admin instructions. Bid x 10 days Patient not taking: Reported on 04/01/2021 03/12/21   [provider]  diltiazem (CARDIZEM CD) 240 MG 24 hr capsule Take 240 mg by mouth daily. Patient not taking: No sig reported 12/24/20   [provider]  lisinopril (ZESTRIL) 5 MG tablet Take 1 tablet by mouth daily. Patient not taking: No sig reported 06/29/20   [provider]    Allergies    Atorvastatin, Bupropion, Citalopram, and Pioglitazone  Review of Systems   Review of Systems  All other systems reviewed and are negative.  Physical Exam Updated Vital Signs BP (!) 130/53   Pulse (!) 49   Temp 98 F (36.7 C) (Oral)   Resp 15   SpO2 93%   Physical Exam Vitals and nursing note reviewed.  Constitutional:      General: He is not in acute  distress.    Appearance: He is not toxic-appearing.  HENT:     Head: Normocephalic and atraumatic.     Mouth/Throat:     Mouth: Mucous membranes are moist.     Pharynx: Oropharynx is clear.  Eyes:     Extraocular Movements: Extraocular movements intact.     Conjunctiva/sclera: Conjunctivae normal.     Comments: Anisocoria noted, round and reactive to light  Cardiovascular:     Rate and Rhythm: Regular rhythm. Bradycardia present.     Pulses: Normal pulses.     Heart sounds: Murmur heard.    No friction rub. No gallop.  Pulmonary:     Effort: Pulmonary effort is normal.     Breath sounds: No stridor. No wheezing, rhonchi or rales.  Abdominal:     General: There is no distension.     Palpations: Abdomen is soft.     Tenderness: There is no abdominal tenderness. There is no right CVA tenderness, left CVA tenderness, guarding or rebound.  Musculoskeletal:     Cervical back: Normal range of motion. No rigidity.     Right lower leg: No edema.     Left  lower leg: No edema.  Skin:    General: Skin is warm and dry.  Neurological:     Mental Status: He is alert and oriented to person, place, and time.     Comments: Mental status: a&o x4 Speech: Dysarthric with persisting expressive aphasia CN II: No blink to threat on right CN III/IV/VI: anisocoric but round and reactive to light, EOMI CN V: facial sensation to LT and mastication intact CN VII: Right nasolabial fold flattening CN VIII: no nystagmus, audition intact to finger rub VN IX/X: swallow intact CN XI: trapezius and SCM motor function intact CN XII: midline tongue w/o atrophy or fasciculation RUE: 5/5 strength, sensation to LT intact LUE: 5/5 strength, sensation to LT intact RLE: Drift when held up but otherwise good strength, sensation to LT intact LLE: 5/5 strength, sensation to LT intact Coordination: finger-to-nose, foot alternation intact. No pronator drift or dysdiadochokinesia Gait: Untested   Psychiatric:         Mood and Affect: Mood normal.        Behavior: Behavior normal.    ED Results / Procedures / Treatments   Labs (all labs ordered are listed, but only abnormal results are displayed) Labs Reviewed  PROTIME-INR - Abnormal; Notable for the following components:      Result Value   Prothrombin Time 20.4 (*)    INR 1.7 (*)    All other components within normal limits  APTT - Abnormal; Notable for the following components:   aPTT 38 (*)    All other components within normal limits  CBC - Abnormal; Notable for the following components:   WBC 24.3 (*)    Hemoglobin 11.1 (*)    HCT 35.2 (*)    RDW 17.3 (*)    All other components within normal limits  DIFFERENTIAL - Abnormal; Notable for the following components:   Neutro Abs 20.2 (*)    Basophils Absolute 0.7 (*)    All other components within normal limits  COMPREHENSIVE METABOLIC PANEL - Abnormal; Notable for the following components:   CO2 18 (*)    Glucose, Bld 158 (*)    BUN 26 (*)    Creatinine, Ser 2.00 (*)    Calcium 8.4 (*)    Albumin 3.0 (*)    GFR, Estimated 34 (*)    All other components within normal limits  URINALYSIS, ROUTINE W REFLEX MICROSCOPIC - Abnormal; Notable for the following components:   APPearance HAZY (*)    Specific Gravity, Urine 1.042 (*)    Hgb urine dipstick LARGE (*)    Ketones, ur 5 (*)    Protein, ur 100 (*)    RBC / HPF >50 (*)    All other components within normal limits  LACTIC ACID, PLASMA - Abnormal; Notable for the following components:   Lactic Acid, Venous 3.5 (*)    All other components within normal limits  LACTIC ACID, PLASMA - Abnormal; Notable for the following components:   Lactic Acid, Venous 4.0 (*)    All other components within normal limits  CBG MONITORING, ED - Abnormal; Notable for the following components:   Glucose-Capillary 164 (*)    All other components within normal limits  I-STAT CHEM 8, ED - Abnormal; Notable for the following components:   BUN 27 (*)     Creatinine, Ser 2.00 (*)    Glucose, Bld 154 (*)    Calcium, Ion 1.05 (*)    TCO2 19 (*)    Hemoglobin 11.9 (*)  HCT 35.0 (*)    All other components within normal limits  CBG MONITORING, ED - Abnormal; Notable for the following components:   Glucose-Capillary 171 (*)    All other components within normal limits  RESP PANEL BY RT-PCR (FLU A&B, COVID) ARPGX2  URINE CULTURE  CULTURE, BLOOD (ROUTINE X 2)  CULTURE, BLOOD (ROUTINE X 2)  ETHANOL  RAPID URINE DRUG SCREEN, HOSP PERFORMED  HEMOGLOBIN A1C  LIPID PANEL    EKG EKG Interpretation  Date/Time:  Monday April 01 2021 15:50:43 EDT Ventricular Rate:  45 PR Interval:  182 QRS Duration: 144 QT Interval:  598 QTC Calculation: 518 R Axis:   67 Text Interpretation: Sinus bradycardia Right bundle branch block Repol abnrm suggests ischemia, diffuse leads t wave changes more prominent since last tracing Confirmed by Linwood Dibbles [phone]) on 04/01/2021 3:57:55 PM  Radiology CT HEAD CODE STROKE WO CONTRAST  Result Date: 04/01/2021 CLINICAL DATA:  Code stroke.  Aphasia EXAM: CT HEAD WITHOUT CONTRAST CT ANGIOGRAPHY OF THE HEAD AND NECK TECHNIQUE: Contiguous axial images were obtained from the base of the skull through the vertex without intravenous contrast. Multidetector CT imaging of the head and neck was performed using the standard protocol during bolus administration of intravenous contrast. Multiplanar CT image reconstructions and MIPs were obtained to evaluate the vascular anatomy. Carotid stenosis measurements (when applicable) are obtained utilizing NASCET criteria, using the distal internal carotid diameter as the denominator. CONTRAST:  60mL OMNIPAQUE IOHEXOL 350 MG/ML SOLN COMPARISON:  CT head 01/21/2021, MRI head 02/17/2021, MRA head 02/17/2021 FINDINGS: CT HEAD Brain: There is no acute intracranial hemorrhage, mass effect, or edema. Gray-white differentiation is preserved. Patchy and confluent areas of hypoattenuation in the  supratentorial white matter are nonspecific but probably reflect moderate chronic microvascular ischemic changes unchanged from prior. There is no extra-axial fluid collection. Ventricles and sulci are stable in size and configuration. Vascular: No hyperdense vessel. Skull: Calvarium is unremarkable. Sinuses/Orbits: No acute finding. Other: None. Review of the MIP images confirms the above findings CTA NECK Aortic arch: Mild calcified plaque along the arch. There is direct origin of the left vertebral artery from the arch. Calcified plaque is present at the origin with apparent marked stenosis (there is some streak artifact through this region) but no flow limitation. Right carotid system: Patent. Mixed plaque at the external carotid origin with high-grade stenosis. Trace calcified plaque along the ICA origin without stenosis. Left carotid system: Patent. Calcified plaque with high-grade stenosis at the external carotid origin. Trace plaque at the ICA origin without stenosis. Vertebral arteries: Patent and codominant. Skeleton: Degenerative changes of the cervical spine. Bulky anterior osteophytes. Other neck: Unremarkable. Upper chest: Included upper lungs are clear. Review of the MIP images confirms the above findings CTA HEAD Anterior circulation: Intracranial internal carotid arteries are patent with calcified plaque along cavernous and proximal supraclinoid portions causing mild to moderate stenosis, right greater than left. Anterior and middle cerebral arteries are patent. Posterior circulation: Intracranial vertebral arteries are patent. Basilar artery is patent. Major cerebellar artery origins are patent. Posterior cerebral arteries are patent. Moderate stenosis of inferior right distal P2 PCA branch. Venous sinuses: Patent as allowed by contrast bolus timing. Review of the MIP images confirms the above findings IMPRESSION: There is no acute intracranial hemorrhage or evidence of acute infarction. ASPECT  score is 10. No large vessel occlusion. Minimal plaque at the ICA origins without stenosis. 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 Marked stenosis of an inferior right P2 PCA branch.  These results were communicated to Dr. Roda Shutters at 3:36 pm on 04/01/2021 by text page via the [facility] messaging system. Electronically Signed   By: Guadlupe Spanish M.D.   On: 04/01/2021 15:49   CT ANGIO HEAD NECK W WO CM (CODE STROKE)  Result Date: 04/01/2021 CLINICAL DATA:  Code stroke.  Aphasia EXAM: CT HEAD WITHOUT CONTRAST CT ANGIOGRAPHY OF THE HEAD AND NECK TECHNIQUE: Contiguous axial images were obtained from the base of the skull through the vertex without intravenous contrast. Multidetector CT imaging of the head and neck was performed using the standard protocol during bolus administration of intravenous contrast. Multiplanar CT image reconstructions and MIPs were obtained to evaluate the vascular anatomy. Carotid stenosis measurements (when applicable) are obtained utilizing NASCET criteria, using the distal internal carotid diameter as the denominator. CONTRAST:  92mL OMNIPAQUE IOHEXOL 350 MG/ML SOLN COMPARISON:  CT head 01/21/2021, MRI head 02/17/2021, MRA head 02/17/2021 FINDINGS: CT HEAD Brain: There is no acute intracranial hemorrhage, mass effect, or edema. Gray-white differentiation is preserved. Patchy and confluent areas of hypoattenuation in the supratentorial white matter are nonspecific but probably reflect moderate chronic microvascular ischemic changes unchanged from prior. There is no extra-axial fluid collection. Ventricles and sulci are stable in size and configuration. Vascular: No hyperdense vessel. Skull: Calvarium is unremarkable. Sinuses/Orbits: No acute finding. Other: None. Review of the MIP images confirms the above findings CTA NECK Aortic arch: Mild calcified plaque along the arch. There is direct origin of the left vertebral artery from the arch. Calcified plaque is present at  the origin with apparent marked stenosis (there is some streak artifact through this region) but no flow limitation. Right carotid system: Patent. Mixed plaque at the external carotid origin with high-grade stenosis. Trace calcified plaque along the ICA origin without stenosis. Left carotid system: Patent. Calcified plaque with high-grade stenosis at the external carotid origin. Trace plaque at the ICA origin without stenosis. Vertebral arteries: Patent and codominant. Skeleton: Degenerative changes of the cervical spine. Bulky anterior osteophytes. Other neck: Unremarkable. Upper chest: Included upper lungs are clear. Review of the MIP images confirms the above findings CTA HEAD Anterior circulation: Intracranial internal carotid arteries are patent with calcified plaque along cavernous and proximal supraclinoid portions causing mild to moderate stenosis, right greater than left. Anterior and middle cerebral arteries are patent. Posterior circulation: Intracranial vertebral arteries are patent. Basilar artery is patent. Major cerebellar artery origins are patent. Posterior cerebral arteries are patent. Moderate stenosis of inferior right distal P2 PCA branch. Venous sinuses: Patent as allowed by contrast bolus timing. Review of the MIP images confirms the above findings IMPRESSION: There is no acute intracranial hemorrhage or evidence of acute infarction. ASPECT score is 10. No large vessel occlusion. Minimal plaque at the ICA origins without stenosis. 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 Marked stenosis of an inferior right P2 PCA branch. These results were communicated to Dr. Roda Shutters at 3:36 pm on 04/01/2021 by text page via the [facility] messaging system. Electronically Signed   By: Guadlupe Spanish M.D.   On: 04/01/2021 15:49    Procedures Procedures   Medications Ordered in ED Medications  acetaminophen (TYLENOL) tablet 650 mg (650 mg Oral Given 04/02/21 0026)    Or  acetaminophen  (TYLENOL) 160 MG/5ML solution 650 mg ( Per Tube See Alternative 04/02/21 0026)    Or  acetaminophen (TYLENOL) suppository 650 mg ( Rectal See Alternative 04/02/21 0026)  senna-docusate (Senokot-S) tablet 1 tablet (has no administration in time range)  cefTRIAXone (ROCEPHIN)  2 g in sodium chloride 0.9 % 100 mL IVPB (0 g Intravenous Stopped 04/01/21 2229)  0.9 %  sodium chloride infusion ( Intravenous New Bag/Given 04/01/21 2138)  insulin aspart (novoLOG) injection 0-15 Units (has no administration in time range)  insulin aspart (novoLOG) injection 0-5 Units (0 Units Subcutaneous Not Given 04/01/21 2202)  pantoprazole (PROTONIX) EC tablet 40 mg (has no administration in time range)  iohexol (OMNIPAQUE) 350 MG/ML injection 60 mL (60 mLs Intravenous Contrast Given 04/01/21 1538)   stroke: mapping our early stages of recovery book ( Does not apply Given 04/01/21 2247)    ED Course  I have reviewed the triage vital signs and the nursing notes.  Pertinent labs & imaging results that were available during my care of the patient were reviewed by me and considered in my medical decision making (see chart for details).    MDM Rules/Calculators/A&P                          This is a 80 year old male with significant cardiovascular comorbidities, presenting as a stroke alert.  Patient with dysarthria, expressive aphasia, generalized weakness, onset approximately noon, LNK 1100.  Patient was immediately assessed by myself, the attending, and the stroke team.  A thorough neuro exam was performed, significant for dysarthria, expressive aphasia, right nasolabial fold flattening, right foot drift.  CT scan was made available, and patient currently transported for stroke imaging (CT head without, head/neck with, and perfusion study), significant for mild to moderate intracranial ICA atherosclerosis, marked stenosis of inferior right P2 PCA branch.  DDx included: Ischemic stroke, hemorrhagic stroke, space-occupying  lesion, TIA, dissection, watershed infarct, seizure, conversion disorder, meningitis, encephalitis, metabolic derangement, exogenous toxicity, arrhythmia  All studies independently reviewed by myself, d/w the attending physician, factored into my MDM. -Stroke imaging as above -EKG: Sinus bradycardia 45 bpm, normal axis, RBBB, deep TWI's in precordial leads most focused on septal lead that are new compared to prior from 02/21/2021 -CBC: WBC 24.3 (11.5) -CMP: Creatinine 2.00 (0.93), CO2 18, AG 14 -Lactate: 3.5 -INR: 1.7 -APTT: 38 -Unremarkable: EtOH, respiratory panel  Presentation most concerning for ischemic stroke, AKI, bradycardia.  Imaging reassuring against hemorrhage and space-occupying lesion.  No chest or abdominal pain to suggest dissection.  Relatively focal deficits suggest against watershed infarct.  Presentation inconsistent with typical seizure semiologies, no postictal phase.  Patient is afebrile without photophobia or neck stiffness.  Aside from creatinine elevation, metabolic panel reassuring against significant derangement.  No history of concerning exposures.  Patient noted to be alternating between sinus rhythm with RBBB or bradycardic in the 40s and 50s versus ventricular rhythm when HR entered 60s and 70s.  Pacemaker interrogation ordered to further work-up.  Blood cultures also ordered for possible sepsis in the context of leukocytosis and lactate elevation, although holding antibiotics for time being as patient is normothermic and without any obvious signs of infection.  Discussed with neuro, who recommend admission for MRI and holding Eliquis until imaging complete.  Discussed with hospital service who agreed to accept the patient.  Plan was discussed with patient family understand and agree.  Patient HDS on reevaluation, subsequently admitted.  Final Clinical Impression(s) / ED Diagnoses Final diagnoses:  AKI (acute kidney injury) (HCC)  Dysarthria  Dysuria   Bradycardia  Right leg weakness    Rx / DC Orders ED Discharge Orders     None        Colvin Carolihandrasekar, Alena Blankenbeckler, MD 04/02/21 0345  Colvin Caroli, MD 04/02/21 2536    Linwood Dibbles, MD 04/02/21 1014

## 2021-04-01 NOTE — Progress Notes (Signed)
Pt stated that he does not want to wear cpap now. Pt has daughter in the room with him now. RT will be notified when pt is ready for cpap.

## 2021-04-02 ENCOUNTER — Observation Stay (HOSPITAL_COMMUNITY): Payer: Medicare HMO

## 2021-04-02 DIAGNOSIS — I361 Nonrheumatic tricuspid (valve) insufficiency: Secondary | ICD-10-CM

## 2021-04-02 DIAGNOSIS — I350 Nonrheumatic aortic (valve) stenosis: Secondary | ICD-10-CM | POA: Diagnosis not present

## 2021-04-02 DIAGNOSIS — I6340 Cerebral infarction due to embolism of unspecified cerebral artery: Secondary | ICD-10-CM

## 2021-04-02 DIAGNOSIS — I340 Nonrheumatic mitral (valve) insufficiency: Secondary | ICD-10-CM | POA: Diagnosis not present

## 2021-04-02 LAB — LIPID PANEL
Cholesterol: 118 mg/dL (ref 0–200)
HDL: 17 mg/dL — ABNORMAL LOW (ref >40)
LDL Cholesterol: 50 mg/dL (ref 0–99)
Total CHOL/HDL Ratio: 6.9 RATIO
Triglycerides: 253 mg/dL — ABNORMAL HIGH (ref <150)
VLDL: 51 mg/dL — ABNORMAL HIGH (ref 0–40)

## 2021-04-02 LAB — RAPID URINE DRUG SCREEN, HOSP PERFORMED
Amphetamines: NOT DETECTED
Barbiturates: NOT DETECTED
Benzodiazepines: NOT DETECTED
Cocaine: NOT DETECTED
Opiates: NOT DETECTED
Tetrahydrocannabinol: NOT DETECTED

## 2021-04-02 LAB — RENAL FUNCTION PANEL
Albumin: 2.8 g/dL — ABNORMAL LOW (ref 3.5–5.0)
Anion gap: 11 (ref 5–15)
BUN: 26 mg/dL — ABNORMAL HIGH (ref 8–23)
CO2: 22 mmol/L (ref 22–32)
Calcium: 8.5 mg/dL — ABNORMAL LOW (ref 8.9–10.3)
Chloride: 102 mmol/L (ref 98–111)
Creatinine, Ser: 1.39 mg/dL — ABNORMAL HIGH (ref 0.61–1.24)
GFR, Estimated: 52 mL/min — ABNORMAL LOW (ref >60)
Glucose, Bld: 163 mg/dL — ABNORMAL HIGH (ref 70–99)
Phosphorus: 3 mg/dL (ref 2.5–4.6)
Potassium: 4.4 mmol/L (ref 3.5–5.1)
Sodium: 135 mmol/L (ref 135–145)

## 2021-04-02 LAB — CBC WITH DIFFERENTIAL/PLATELET
Abs Immature Granulocytes: 0.1 10*3/uL — ABNORMAL HIGH (ref 0.00–0.07)
Basophils Absolute: 0.1 10*3/uL (ref 0.0–0.1)
Basophils Relative: 1 %
Eosinophils Absolute: 0.2 10*3/uL (ref 0.0–0.5)
Eosinophils Relative: 1 %
HCT: 34.9 % — ABNORMAL LOW (ref 39.0–52.0)
Hemoglobin: 11.1 g/dL — ABNORMAL LOW (ref 13.0–17.0)
Immature Granulocytes: 1 %
Lymphocytes Relative: 10 %
Lymphs Abs: 1.6 10*3/uL (ref 0.7–4.0)
MCH: 26.1 pg (ref 26.0–34.0)
MCHC: 31.8 g/dL (ref 30.0–36.0)
MCV: 81.9 fL (ref 80.0–100.0)
Monocytes Absolute: 1.6 10*3/uL — ABNORMAL HIGH (ref 0.1–1.0)
Monocytes Relative: 10 %
Neutro Abs: 12.6 10*3/uL — ABNORMAL HIGH (ref 1.7–7.7)
Neutrophils Relative %: 77 %
Platelets: 252 10*3/uL (ref 150–400)
RBC: 4.26 MIL/uL (ref 4.22–5.81)
RDW: 17.4 % — ABNORMAL HIGH (ref 11.5–15.5)
WBC: 16.2 10*3/uL — ABNORMAL HIGH (ref 4.0–10.5)
nRBC: 0 % (ref 0.0–0.2)

## 2021-04-02 LAB — ECHOCARDIOGRAM COMPLETE
AR max vel: 0.92 cm2
AV Area VTI: 0.97 cm2
AV Area mean vel: 0.9 cm2
AV Mean grad: 30 mmHg
AV Peak grad: 56.6 mmHg
Ao pk vel: 3.76 m/s
S' Lateral: 3.6 cm
Weight: 3569.69 oz

## 2021-04-02 LAB — CBG MONITORING, ED
Glucose-Capillary: 158 mg/dL — ABNORMAL HIGH (ref 70–99)
Glucose-Capillary: 185 mg/dL — ABNORMAL HIGH (ref 70–99)
Glucose-Capillary: 199 mg/dL — ABNORMAL HIGH (ref 70–99)

## 2021-04-02 LAB — URINALYSIS, ROUTINE W REFLEX MICROSCOPIC
Bacteria, UA: NONE SEEN
Bilirubin Urine: NEGATIVE
Glucose, UA: NEGATIVE mg/dL
Ketones, ur: 5 mg/dL — AB
Leukocytes,Ua: NEGATIVE
Nitrite: NEGATIVE
Protein, ur: 100 mg/dL — AB
RBC / HPF: >50 RBC/hpf — ABNORMAL HIGH (ref 0–5)
Specific Gravity, Urine: 1.042 — ABNORMAL HIGH (ref 1.005–1.030)
pH: 5 (ref 5.0–8.0)

## 2021-04-02 LAB — LACTIC ACID, PLASMA
Lactic Acid, Venous: 2.1 mmol/L (ref 0.5–1.9)
Lactic Acid, Venous: 2.1 mmol/L (ref 0.5–1.9)

## 2021-04-02 LAB — HEMOGLOBIN A1C
Hgb A1c MFr Bld: 6.6 % — ABNORMAL HIGH (ref 4.8–5.6)
Mean Plasma Glucose: 142.72 mg/dL

## 2021-04-02 LAB — BRAIN NATRIURETIC PEPTIDE: B Natriuretic Peptide: 333.4 pg/mL — ABNORMAL HIGH (ref 0.0–100.0)

## 2021-04-02 LAB — MAGNESIUM: Magnesium: 1.9 mg/dL (ref 1.7–2.4)

## 2021-04-02 LAB — GLUCOSE, CAPILLARY: Glucose-Capillary: 204 mg/dL — ABNORMAL HIGH (ref 70–99)

## 2021-04-02 MED ORDER — PRESERVISION/LUTEIN PO CAPS: ORAL_CAPSULE | Freq: Every day | ORAL | Status: DC

## 2021-04-02 MED ORDER — PROSIGHT PO TABS
1.0000 | ORAL_TABLET | Freq: Every day | ORAL | Status: DC
  Administered 2021-04-02 – 2021-04-04 (×3): 1 via ORAL
  Filled 2021-04-02 (×3): qty 1

## 2021-04-02 MED ORDER — PERFLUTREN LIPID MICROSPHERE
1.0000 mL | INTRAVENOUS | Status: AC | PRN
  Administered 2021-04-02: 5 mL via INTRAVENOUS
  Filled 2021-04-02: qty 10

## 2021-04-02 MED ORDER — PRAVASTATIN SODIUM 10 MG PO TABS
20.0000 mg | ORAL_TABLET | Freq: Every day | ORAL | Status: DC
  Administered 2021-04-02 – 2021-04-04 (×3): 20 mg via ORAL
  Filled 2021-04-02 (×3): qty 2

## 2021-04-02 MED ORDER — APIXABAN 5 MG PO TABS
5.0000 mg | ORAL_TABLET | Freq: Two times a day (BID) | ORAL | Status: DC
  Administered 2021-04-02 – 2021-04-04 (×4): 5 mg via ORAL
  Filled 2021-04-02 (×4): qty 1

## 2021-04-02 MED ORDER — OCUVITE-LUTEIN PO CAPS
1.0000 | ORAL_CAPSULE | Freq: Every day | ORAL | Status: DC
  Filled 2021-04-02: qty 1

## 2021-04-02 MED ORDER — INSULIN ASPART 100 UNIT/ML IJ SOLN
4.0000 [IU] | Freq: Three times a day (TID) | INTRAMUSCULAR | Status: DC
  Administered 2021-04-02 – 2021-04-03 (×2): 4 [IU] via SUBCUTANEOUS

## 2021-04-02 MED ORDER — OMEGA-3-ACID ETHYL ESTERS 1 G PO CAPS
1.0000 g | ORAL_CAPSULE | Freq: Every day | ORAL | Status: DC
  Administered 2021-04-02 – 2021-04-04 (×3): 1 g via ORAL
  Filled 2021-04-02 (×3): qty 1

## 2021-04-02 MED ORDER — INSULIN GLARGINE 100 UNIT/ML ~~LOC~~ SOLN
10.0000 [IU] | Freq: Two times a day (BID) | SUBCUTANEOUS | Status: DC
  Administered 2021-04-02 – 2021-04-03 (×3): 10 [IU] via SUBCUTANEOUS
  Filled 2021-04-02 (×5): qty 0.1

## 2021-04-02 MED ORDER — INSULIN ASPART 100 UNIT/ML IJ SOLN
0.0000 [IU] | Freq: Three times a day (TID) | INTRAMUSCULAR | Status: DC
  Administered 2021-04-02 – 2021-04-04 (×5): 3 [IU] via SUBCUTANEOUS

## 2021-04-02 MED ORDER — OMEGA-3 1000 MG PO CAPS: 1000.0000 mg | ORAL_CAPSULE | Freq: Every evening | ORAL | Status: DC

## 2021-04-02 MED ORDER — INSULIN ASPART 100 UNIT/ML IJ SOLN
0.0000 [IU] | Freq: Every day | INTRAMUSCULAR | Status: DC
  Administered 2021-04-02: 2 [IU] via SUBCUTANEOUS
  Administered 2021-04-03: 3 [IU] via SUBCUTANEOUS

## 2021-04-02 MED ORDER — SODIUM CHLORIDE 0.9 % IV SOLN: INTRAVENOUS | Status: DC

## 2021-04-02 NOTE — Progress Notes (Signed)
STROKE TEAM PROGRESS NOTE   SUBJECTIVE (INTERVAL HISTORY) His wife is at the bedside.  Overall his condition is rapidly improving.  Per patient and wife, patient speech much improved, now near baseline.  Still pending MRI, given recent pacemaker placement, plan to do MRI tomorrow.  Will resume Eliquis as patient symptoms nearly resolved.   OBJECTIVE Temp:  [98.3 F (36.8 C)] 98.3 F (36.8 C) (07/12 1155) Pulse Rate:  [28-54] 54 (07/12 2000) Resp:  [12-30] 30 (07/12 2000) BP: (102-130)/(42-97) 126/47 (07/12 2000) SpO2:  [90 %-96 %] 93 % (07/12 2000)  Recent Labs  Lab 04/01/21 1508 04/01/21 2144 04/02/21 0806 04/02/21 1151 04/02/21 1719  GLUCAP 164* 171* 158* 185* 199*   Recent Labs  Lab 04/01/21 1511 04/01/21 1517 04/02/21 0834  NA 135 136 135  K 4.9 4.8 4.4  CL 103 105 102  CO2 18*  --  22  GLUCOSE 158* 154* 163*  BUN 26* 27* 26*  CREATININE 2.00* 2.00* 1.39*  CALCIUM 8.4*  --  8.5*  MG  --   --  1.9  PHOS  --   --  3.0   Recent Labs  Lab 04/01/21 1511 04/02/21 0834  AST 22  --   ALT 14  --   ALKPHOS 63  --   BILITOT 1.0  --   PROT 7.0  --   ALBUMIN 3.0* 2.8*   Recent Labs  Lab 04/01/21 1511 04/01/21 1517 04/02/21 0834  WBC 24.3*  --  16.2*  NEUTROABS 20.2*  --  12.6*  HGB 11.1* 11.9* 11.1*  HCT 35.2* 35.0* 34.9*  MCV 83.4  --  81.9  PLT 258  --  252   No results for input(s): CKTOTAL, CKMB, CKMBINDEX, TROPONINI in the last 168 hours. Recent Labs    04/01/21 1511  LABPROT 20.4*  INR 1.7*   Recent Labs    04/02/21 0040  COLORURINE YELLOW  LABSPEC 1.042*  PHURINE 5.0  GLUCOSEU NEGATIVE  HGBUR LARGE*  BILIRUBINUR NEGATIVE  KETONESUR 5*  PROTEINUR 100*  NITRITE NEGATIVE  LEUKOCYTESUR NEGATIVE       Component Value Date/Time   CHOL 118 04/02/2021 0420   TRIG 253 (H) 04/02/2021 0420   HDL 17 (L) 04/02/2021 0420   CHOLHDL 6.9 04/02/2021 0420   VLDL 51 (H) 04/02/2021 0420   LDLCALC 50 04/02/2021 0420   Lab Results  Component Value  Date   HGBA1C 6.6 (H) 04/02/2021      Component Value Date/Time   LABOPIA NONE DETECTED 04/02/2021 0038   COCAINSCRNUR NONE DETECTED 04/02/2021 0038   LABBENZ NONE DETECTED 04/02/2021 0038   AMPHETMU NONE DETECTED 04/02/2021 0038   THCU NONE DETECTED 04/02/2021 0038   LABBARB NONE DETECTED 04/02/2021 0038    Recent Labs  Lab 04/01/21 1511  ETH <10    I have personally reviewed the radiological images below and agree with the radiology interpretations.  ECHOCARDIOGRAM COMPLETE  Result Date: 04/02/2021    ECHOCARDIOGRAM REPORT   Patient Name:   Harry [name] Date of Exam: 04/02/2021 Medical Rec #:  [id number]         Height:       67.0 in Accession #:    [id number]        Weight:       223.1 lb Date of Birth:  September 20, 1941          BSA:          2.118 m Patient Age:    80 years  BP:           102/42 mmHg Patient Gender: M                 HR:           49 bpm. Exam Location:  Inpatient Procedure: 2D Echo, Cardiac Doppler and Color Doppler Indications:    TIA  History:        Patient has prior history of Echocardiogram examinations, most                 recent 02/21/2021. CAD; Risk Factors:Hypertension and Diabetes.  Sonographer:    Shirlean Kelly Referring Phys: [id number] Almon Hercules  Sonographer Comments: Image acquisition challenging due to patient body habitus. IMPRESSIONS  1. Left ventricular ejection fraction, by estimation, is 60 to 65%. The left ventricle has normal function. The left ventricle has no regional wall motion abnormalities. Left ventricular diastolic function could not be evaluated.  2. Right ventricular systolic function is normal. The right ventricular size is normal. Tricuspid regurgitation signal is inadequate for assessing PA pressure.  3. Right atrial size was mildly dilated.  4. The mitral valve is normal in structure. Mild mitral valve regurgitation. No evidence of mitral stenosis.  5. The aortic valve is calcified. There is severe calcifcation of the aortic  valve. There is severe thickening of the aortic valve. Aortic valve regurgitation is not visualized. Moderate to severe aortic valve stenosis.Aortic valve mean gradient measures 30.0 mmHg. Aortic valve peak gradient measures 56.6 mmHg. Aortic valve area, by VTI measures 0.97 cm.  6. The inferior vena cava is dilated in size with >50% respiratory variability, suggesting right atrial pressure of 8 mmHg. FINDINGS  Left Ventricle: Left ventricular ejection fraction, by estimation, is 60 to 65%. The left ventricle has normal function. The left ventricle has no regional wall motion abnormalities. The left ventricular internal cavity size was normal in size. There is  no left ventricular hypertrophy. Left ventricular diastolic function could not be evaluated due to atrial fibrillation. Left ventricular diastolic function could not be evaluated. Right Ventricle: The right ventricular size is normal. No increase in right ventricular wall thickness. Right ventricular systolic function is normal. Tricuspid regurgitation signal is inadequate for assessing PA pressure. Left Atrium: Left atrial size was normal in size. Right Atrium: Right atrial size was mildly dilated. Pericardium: There is no evidence of pericardial effusion. Mitral Valve: The mitral valve is normal in structure. Mild to moderate mitral annular calcification. Mild mitral valve regurgitation. No evidence of mitral valve stenosis. Tricuspid Valve: The tricuspid valve is normal in structure. Tricuspid valve regurgitation is mild . No evidence of tricuspid stenosis. Aortic Valve: The aortic valve is calcified. There is severe calcifcation of the aortic valve. There is severe thickening of the aortic valve. Aortic valve regurgitation is not visualized. Moderate to severe aortic stenosis is present. Aortic valve mean gradient measures 30.0 mmHg. Aortic valve peak gradient measures 56.6 mmHg. Aortic valve area, by VTI measures 0.97 cm. Pulmonic Valve: The pulmonic  valve was normal in structure. Pulmonic valve regurgitation is trivial. No evidence of pulmonic stenosis. Aorta: The aortic root is normal in size and structure. Venous: The inferior vena cava is dilated in size with greater than 50% respiratory variability, suggesting right atrial pressure of 8 mmHg. IAS/Shunts: No atrial level shunt detected by color flow Doppler.  LEFT VENTRICLE PLAX 2D LVIDd:         5.40 cm LVIDs:  3.60 cm LV PW:         1.00 cm LV IVS:        1.40 cm LVOT diam:     2.20 cm LV SV:         85 LV SV Index:   40 LVOT Area:     3.80 cm  RIGHT VENTRICLE             IVC RV Basal diam:  3.70 cm     IVC diam: 2.30 cm RV S prime:     16.20 cm/s TAPSE (M-mode): 2.9 cm LEFT ATRIUM           Index       RIGHT ATRIUM           Index LA diam:      4.10 cm 1.94 cm/m  RA Area:     24.50 cm LA Vol (A2C): 61.5 ml 29.03 ml/m RA Volume:   73.60 ml  34.74 ml/m LA Vol (A4C): 73.9 ml 34.88 ml/m  AORTIC VALVE AV Area (Vmax):    0.92 cm AV Area (Vmean):   0.90 cm AV Area (VTI):     0.97 cm AV Vmax:           376.00 cm/s AV Vmean:          248.000 cm/s AV VTI:            0.879 m AV Peak Grad:      56.6 mmHg AV Mean Grad:      30.0 mmHg LVOT Vmax:         91.10 cm/s LVOT Vmean:        59.000 cm/s LVOT VTI:          0.224 m LVOT/AV VTI ratio: 0.25  AORTA Ao Root diam: 3.50 cm Ao Asc diam:  3.00 cm  SHUNTS Systemic VTI:  0.22 m Systemic Diam: 2.20 cm Armanda Magic MD Electronically signed by Armanda Magic MD Signature Date/Time: 04/02/2021/2:01:56 PM    Final    CT HEAD CODE STROKE WO CONTRAST  Result Date: 04/01/2021 CLINICAL DATA:  Code stroke.  Aphasia EXAM: CT HEAD WITHOUT CONTRAST CT ANGIOGRAPHY OF THE HEAD AND NECK TECHNIQUE: Contiguous axial images were obtained from the base of the skull through the vertex without intravenous contrast. Multidetector CT imaging of the head and neck was performed using the standard protocol during bolus administration of intravenous contrast. Multiplanar CT image  reconstructions and MIPs were obtained to evaluate the vascular anatomy. Carotid stenosis measurements (when applicable) are obtained utilizing NASCET criteria, using the distal internal carotid diameter as the denominator. CONTRAST:  41mL OMNIPAQUE IOHEXOL 350 MG/ML SOLN COMPARISON:  CT head 01/21/2021, MRI head 02/17/2021, MRA head 02/17/2021 FINDINGS: CT HEAD Brain: There is no acute intracranial hemorrhage, mass effect, or edema. Gray-white differentiation is preserved. Patchy and confluent areas of hypoattenuation in the supratentorial white matter are nonspecific but probably reflect moderate chronic microvascular ischemic changes unchanged from prior. There is no extra-axial fluid collection. Ventricles and sulci are stable in size and configuration. Vascular: No hyperdense vessel. Skull: Calvarium is unremarkable. Sinuses/Orbits: No acute finding. Other: None. Review of the MIP images confirms the above findings CTA NECK Aortic arch: Mild calcified plaque along the arch. There is direct origin of the left vertebral artery from the arch. Calcified plaque is present at the origin with apparent marked stenosis (there is some streak artifact through this region) but no flow limitation. Right carotid system: Patent. Mixed plaque at  the external carotid origin with high-grade stenosis. Trace calcified plaque along the ICA origin without stenosis. Left carotid system: Patent. Calcified plaque with high-grade stenosis at the external carotid origin. Trace plaque at the ICA origin without stenosis. Vertebral arteries: Patent and codominant. Skeleton: Degenerative changes of the cervical spine. Bulky anterior osteophytes. Other neck: Unremarkable. Upper chest: Included upper lungs are clear. Review of the MIP images confirms the above findings CTA HEAD Anterior circulation: Intracranial internal carotid arteries are patent with calcified plaque along cavernous and proximal supraclinoid portions causing mild to  moderate stenosis, right greater than left. Anterior and middle cerebral arteries are patent. Posterior circulation: Intracranial vertebral arteries are patent. Basilar artery is patent. Major cerebellar artery origins are patent. Posterior cerebral arteries are patent. Moderate stenosis of inferior right distal P2 PCA branch. Venous sinuses: Patent as allowed by contrast bolus timing. Review of the MIP images confirms the above findings IMPRESSION: There is no acute intracranial hemorrhage or evidence of acute infarction. ASPECT score is 10. No large vessel occlusion. Minimal plaque at the ICA origins without stenosis. 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 Marked stenosis of an inferior right P2 PCA branch. These results were communicated to Dr. Roda Shutters at 3:36 pm on 04/01/2021 by text page via the [facility] messaging system. Electronically Signed   By: Guadlupe Spanish M.D.   On: 04/01/2021 15:49   CT ANGIO HEAD NECK W WO CM (CODE STROKE)  Result Date: 04/01/2021 CLINICAL DATA:  Code stroke.  Aphasia EXAM: CT HEAD WITHOUT CONTRAST CT ANGIOGRAPHY OF THE HEAD AND NECK TECHNIQUE: Contiguous axial images were obtained from the base of the skull through the vertex without intravenous contrast. Multidetector CT imaging of the head and neck was performed using the standard protocol during bolus administration of intravenous contrast. Multiplanar CT image reconstructions and MIPs were obtained to evaluate the vascular anatomy. Carotid stenosis measurements (when applicable) are obtained utilizing NASCET criteria, using the distal internal carotid diameter as the denominator. CONTRAST:  60mL OMNIPAQUE IOHEXOL 350 MG/ML SOLN COMPARISON:  CT head 01/21/2021, MRI head 02/17/2021, MRA head 02/17/2021 FINDINGS: CT HEAD Brain: There is no acute intracranial hemorrhage, mass effect, or edema. Gray-white differentiation is preserved. Patchy and confluent areas of hypoattenuation in the supratentorial white  matter are nonspecific but probably reflect moderate chronic microvascular ischemic changes unchanged from prior. There is no extra-axial fluid collection. Ventricles and sulci are stable in size and configuration. Vascular: No hyperdense vessel. Skull: Calvarium is unremarkable. Sinuses/Orbits: No acute finding. Other: None. Review of the MIP images confirms the above findings CTA NECK Aortic arch: Mild calcified plaque along the arch. There is direct origin of the left vertebral artery from the arch. Calcified plaque is present at the origin with apparent marked stenosis (there is some streak artifact through this region) but no flow limitation. Right carotid system: Patent. Mixed plaque at the external carotid origin with high-grade stenosis. Trace calcified plaque along the ICA origin without stenosis. Left carotid system: Patent. Calcified plaque with high-grade stenosis at the external carotid origin. Trace plaque at the ICA origin without stenosis. Vertebral arteries: Patent and codominant. Skeleton: Degenerative changes of the cervical spine. Bulky anterior osteophytes. Other neck: Unremarkable. Upper chest: Included upper lungs are clear. Review of the MIP images confirms the above findings CTA HEAD Anterior circulation: Intracranial internal carotid arteries are patent with calcified plaque along cavernous and proximal supraclinoid portions causing mild to moderate stenosis, right greater than left. Anterior and middle cerebral arteries are patent. Posterior  circulation: Intracranial vertebral arteries are patent. Basilar artery is patent. Major cerebellar artery origins are patent. Posterior cerebral arteries are patent. Moderate stenosis of inferior right distal P2 PCA branch. Venous sinuses: Patent as allowed by contrast bolus timing. Review of the MIP images confirms the above findings IMPRESSION: There is no acute intracranial hemorrhage or evidence of acute infarction. ASPECT score is 10. No large  vessel occlusion. Minimal plaque at the ICA origins without stenosis. 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 Marked stenosis of an inferior right P2 PCA branch. These results were communicated to Dr. Roda ShuttersXu at 3:36 pm on 04/01/2021 by text page via the [facility] messaging system. Electronically Signed   By: Guadlupe SpanishPraneil  Patel M.D.   On: 04/01/2021 15:49      PHYSICAL EXAM  Temp:  [98.3 F (36.8 C)] 98.3 F (36.8 C) (07/12 1155) Pulse Rate:  [28-54] 54 (07/12 2000) Resp:  [12-30] 30 (07/12 2000) BP: (102-130)/(42-97) 126/47 (07/12 2000) SpO2:  [90 %-96 %] 93 % (07/12 2000)  General - Well nourished, well developed, in no apparent distress.  Ophthalmologic - fundi not visualized due to noncooperation.  Cardiovascular - irregularly irregular heart rate and rhythm.  Mental Status -  Level of arousal and orientation to time, place, and person were intact. Language including expression, naming, repetition, comprehension was assessed and found intact. Fund of Knowledge was assessed and was intact.  Cranial Nerves II - XII - II - Visual field intact OU. III, IV, VI - Extraocular movements intact. V - Facial sensation intact bilaterally. VII - Facial movement intact bilaterally. VIII - Hearing & vestibular intact bilaterally. X - Palate elevates symmetrically. XI - Chin turning & shoulder shrug intact bilaterally. XII - Tongue protrusion intact.  Motor Strength - The patient's strength was normal in all extremities and pronator drift was absent.  Bulk was normal and fasciculations were absent.   Motor Tone - Muscle tone was assessed at the neck and appendages and was normal.  Reflexes - The patient's reflexes were symmetrical in all extremities and he had no pathological reflexes.  Sensory - Light touch, temperature/pinprick were assessed and were symmetrical.    Coordination - The patient had normal movements in the hands and feet with no ataxia or dysmetria.  Tremor  was absent.  Gait and Station - deferred.   ASSESSMENT/PLAN Harry [name] is a 80 y.o. male with history of hypertension, hyperlipidemia, OSA, A. fib on Eliquis, CAD, diabetes, SSS with recent pacemaker placement admitted for slurred speech, word finding difficulty. No tPA given due to on Eliquis.    Stroke:  left brain infarct, MRI pending CT no acute abnormality CTA head and neck no LVO, marked stenosis right P2 branch.  Mild to moderate ntracranial ICA atherosclerosis. MRI pending 2D Echo EF 60 to 65% LDL 50 HgbA1c 6.6 Eliquis for VTE prophylaxis Eliquis (apixaban) daily prior to admission, now on Eliquis (apixaban) daily.  Patient counseled to be compliant with his antithrombotic medications Ongoing aggressive stroke risk factor management Therapy recommendations: Pending Disposition: Pending  Chronic A. Fib On Eliquis PTA Rate controlled Continue Eliquis  SSS status post pacemaker Leadless pacemaker placed 02/20/2021 by Dr. Marge [facility]achaos at [facility] Okay to have MRI tomorrow  Diabetes HgbA1c 6.6 goal < 7.0 Controlled Currently on Lantus CBG monitoring SSI DM education and close PCP follow up  Hypertension Stable Long term BP goal normotensive  Hyperlipidemia Home meds: Pravastatin 20 LDL 50, goal < 70 Now on pravastatin 20 Continue statin at discharge  Other Stroke Risk Factors Advanced age Obesity, calculate BMI 34.94 Obstructive sleep apnea, on CPAP at home  Other Active Problems Cough, on Rocephin  Hospital day # 0    Marvel Plan, MD PhD Stroke Neurology 04/02/2021 8:38 PM    To contact Stroke Continuity provider, please refer to [email or web address]. After hours, contact General Neurology

## 2021-04-02 NOTE — Evaluation (Signed)
Physical Therapy Evaluation Patient Details Name: Harry [name] MRN: [id number] DOB: 1941-03-24 Today's Date: 04/02/2021   History of Present Illness  Pt is 80 yo male admitted with slurred speech/expressive aphasia for CVA work up, AKI, and possible UTI on 04/01/21.  CT head was negatvie for acute infarct, MRI on hold due to recent pacemaker.  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  Clinical Impression  Pt admitted with above diagnosis. At baseline, pt resides with wife and required min A for ADLs and could ambulate with RW.  He does reports DOE and easily fatigues at baseline, and recent pacemaker. Today , he was min A for bed mobility and min guard to take a few steps in room.  Pt reports fatigue and having a bad night, so not wanting to go further.  Strength tested equally bilaterally and slurred speech resolved.  Pt feels close to his baseline, but will benefit from acute PT to advance and maintain safe mobility while hosptailzed. Pt currently with functional limitations due to the deficits listed below (see PT Problem List). Pt will benefit from skilled PT to increase their independence and safety with mobility to allow discharge to the venue listed below.       Follow Up Recommendations No PT follow up;Home health PT;Supervision for mobility/OOB    Equipment Recommendations  3in1 (PT)    Recommendations for Other Services       Precautions / Restrictions Precautions Precautions: Fall      Mobility  Bed Mobility Overal bed mobility: Needs Assistance Bed Mobility: Supine to Sit;Sit to Supine     Supine to sit: Min assist Sit to supine: Min assist        Transfers Overall transfer level: Needs assistance Equipment used: Rolling walker (2 wheeled) Transfers: Sit to/from Stand Sit to Stand: Min guard         General transfer comment: min guard for sfaety  Ambulation/Gait Ambulation/Gait assistance:  Min guard Gait Distance (Feet): 4 Feet Assistive device: Rolling walker (2 wheeled) Gait Pattern/deviations: Step-to pattern;Decreased stride length Gait velocity: decreased   General Gait Details: Side steps at EOB, pt reports having a really bad night and not wanting to do more walking.  Stairs            Wheelchair Mobility    Modified Rankin (Stroke Patients Only) Modified Rankin (Stroke Patients Only) Pre-Morbid Rankin Score: Moderate disability Modified Rankin: Moderate disability     Balance Overall balance assessment: Needs assistance Sitting-balance support: No upper extremity supported Sitting balance-Leahy Scale: Good     Standing balance support: Bilateral upper extremity supported Standing balance-Leahy Scale: Poor Standing balance comment: Min guard with RW                             Pertinent Vitals/Pain Pain Assessment: No/denies pain    Home Living Family/patient expects to be discharged to:: Private residence Living Arrangements: Spouse/significant other Available Help at Discharge: Family;Available 24 hours/day Type of Home: House Home Access: Stairs to enter Entrance Stairs-Rails: Doctor, general practice of Steps: 3 Home Layout: One level Home Equipment: Grab bars - tub/shower;Shower seat - built in;Walker - 2 wheels;Cane - single point      Prior Function Level of Independence: Needs assistance   Gait / Transfers Assistance Needed: Ambulates in home with RW; reports get short of breath for past several months  ADL's /  Homemaking Assistance Needed: Wife assist with bath; pt independent with dressing and toileting; wife assist with IADLs  Comments: 2 recent falls (1 from neuropathy and 1 syncope prior to getting pacemaker)     Hand Dominance        Extremity/Trunk Assessment   Upper Extremity Assessment Upper Extremity Assessment: Overall WFL for tasks assessed    Lower Extremity Assessment Lower Extremity  Assessment: LLE deficits/detail;RLE deficits/detail RLE Deficits / Details: ROM WFL; MMT 5/5 LLE Deficits / Details: ROM WFL; MMT 5/5    Cervical / Trunk Assessment Cervical / Trunk Assessment: Normal  Communication   Communication: No difficulties  Cognition Arousal/Alertness: Awake/alert Behavior During Therapy: WFL for tasks assessed/performed Overall Cognitive Status: Within Functional Limits for tasks assessed                                        General Comments General comments (skin integrity, edema, etc.): Pt with soft BP 90's/60's RN aware; HR 55 bpm    Exercises     Assessment/Plan    PT Assessment Patient needs continued PT services  PT Problem List Decreased strength;Decreased mobility;Decreased activity tolerance;Cardiopulmonary status limiting activity;Decreased balance;Decreased knowledge of use of DME       PT Treatment Interventions DME instruction;Therapeutic activities;Gait training;Therapeutic exercise;Patient/family education;Balance training;Stair training;Functional mobility training    PT Goals (Current goals can be found in the Care Plan section)  Acute Rehab PT Goals Patient Stated Goal: return home PT Goal Formulation: With patient/family Time For Goal Achievement: 04/16/21 Potential to Achieve Goals: Good    Frequency Min 3X/week   Barriers to discharge        Co-evaluation               AM-PAC PT "6 Clicks" Mobility  Outcome Measure Help needed turning from your back to your side while in a flat bed without using bedrails?: A Little Help needed moving from lying on your back to sitting on the side of a flat bed without using bedrails?: A Little Help needed moving to and from a bed to a chair (including a wheelchair)?: A Little Help needed standing up from a chair using your arms (e.g., wheelchair or bedside chair)?: A Little Help needed to walk in hospital room?: A Little Help needed climbing 3-5 steps with a  railing? : A Little 6 Click Score: 18    End of Session Equipment Utilized During Treatment: Gait belt Activity Tolerance: Patient tolerated treatment well Patient left: in bed;with call bell/phone within reach;with family/visitor present (ED stretcher) Nurse Communication: Mobility status PT Visit Diagnosis: Unsteadiness on feet (R26.81);Muscle weakness (generalized) (M62.81)    Time: 1435-1510 PT Time Calculation (min) (ACUTE ONLY): 35 min   Charges:   PT Evaluation $PT Eval Low Complexity: 1 Low PT Treatments $Therapeutic Activity: 8-22 mins        Harry [name], PT Acute Rehab Services Pager [phone] [facility] [phone]   Harry [name] 04/02/2021, 3:21 PM

## 2021-04-02 NOTE — Progress Notes (Signed)
PROGRESS NOTE  Harry [name] NFA:[id number] DOB: 1941-06-16   PCP: Kandyce Rud, MD  Patient is from: Home.  Lives with his wife.  Uses walker  DOA: 04/01/2021 LOS: 0  Chief complaints:  Chief Complaint  Patient presents with   Code Stroke     Brief Narrative / Interim history: 80 y.o. 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  CTH without acute finding.  CTA head and neck negative for LVO but mild to moderate right ICA stenosis and marked stenosis of an inferior right P2 PCA branch.  UA with large Hgb.  Lactic acid 3.5> 4.0.  Cultures obtained.  Started on ceftriaxone.  Neurology recommended holding Eliquis pending MRI brain.  MRI brain on hold due to recent PPM placement.   Subjective: Seen and examined earlier this morning.  No major events overnight of this morning.  Reports having urinary accidents overnight.  No headache, vision change, focal neuro symptoms, chest pain, dyspnea or GI symptoms.  Objective: Vitals:   04/02/21 1000 04/02/21 1018 04/02/21 1045 04/02/21 1155  BP: (!) 117/97     Pulse: (!) 51 (!) 50 (!) 50   Resp: (!) Temp:    98.3 F (36.8 C)  TempSrc:    Oral  SpO2: 91% 92% 93%     Intake/Output Summary (Last 24 hours) at 04/02/2021 1303 Last data filed at 04/02/2021 4696 Gross per 24 hour  Intake 728.22 ml  Output --  Net 728.22 ml   There were no vitals filed for this visit.  Examination:  GENERAL: No apparent distress.  Nontoxic. HEENT: MMM.  Vision and hearing grossly intact.  NECK: Supple.  No apparent JVD.  RESP:  No IWOB.  Fair aeration bilaterally. CVS: Bradycardic to 50s.  2/6 SEM all over. ABD/GI/GU: BS+. Abd soft, NTND.  MSK/EXT:  Moves extremities. No apparent deformity. No edema.  SKIN: no apparent skin lesion or  wound NEURO: Awake, alert and oriented appropriately. Speech clear. Cranial nerves II-XII grossly intact. Motor 5/5 in all muscle groups of UE and LE bilaterally, Normal tone. Light sensation intact in all dermatomes of upper and lower ext bilaterally. Patellar reflex symmetric.  No pronator drift.  Finger to nose intact. PSYCH: Calm. Normal affect.   Procedures:  None  Microbiology summarized: COVID-19 and influenza PCR nonreactive. Blood culture NGTD. Urine culture pending  Assessment & Plan: Stroke-like symptoms-had slurred speech and expressive aphasia that have resolved.  His neuro exam is basically normal. CTH without acute finding.  CTA head and neck negative for LVO but mild to moderate right ICA stenosis and marked stenosis of an inferior right P2 PCA branch.  Not a candidate for tPA as he was on Eliquis.  UDS negative.  A1c 6.6%.  LDL 50.  TG 253. -Neurology recommended holding Eliquis pending MRI.  However, we may have to resume his Eliquis if we cannot do his MRI soon due to his pacemaker.  -Follow TTE -Permissive hypertension to 180/105 for 24 to 48 hours post CVA onset per neurology -Continue IV fluid given soft blood pressure and AKI -PT/OT/SLP -Continue telemetry monitoring and neurochecks -Pacemaker interrogation requested by EDP.  Cardiology consulted as well. -Has history of statin intolerance.  Only takes pravastatin.  He may benefit from  PCSK9 inhibitors.   Leukocytosis/possible UTI: patient has urinary urgency for 2 days.  Has leukocytosis but no fever.  UA with large Hgb but no pyuria, nitrite or bacteria.  Has lactic acidosis which is resolving with IV fluid.  Blood culture NGTD.  Urine culture pending. -Continue IV ceftriaxone pending urine culture.   Paroxysmal A. fib with bradycardia-CHA2DS2-VASc score 7.  On Cardizem, metoprolol and Eliquis Sinus bradycardia and patient with high-grade AVB and PPM-had PPM on 02/20/2021.  -Pacemaker interrogation pending.   Requested by EDP -Cardiology consulted -Continue telemetry monitoring -Continue holding metoprolol     AKI/azotemia-unclear etiology of this.  Hemodynamically mediated?  Not on nephrotoxic meds: Improved. Recent Labs    01/21/21 0022 02/17/21 1551 02/19/21 0657 02/20/21 0548 02/21/21 0504 04/01/21 1511 04/01/21 1517 04/02/21 0834  BUN 21 17 23  25* 22 26* 27* 26*  CREATININE 1.01 1.00 1.09 0.99 0.93 2.00* 2.00* 1.39*  -Continue IV fluid for the next 24 hours -Recheck renal function in the morning -Avoid/minimize nephrotoxic meds     Controlled IDDM-2 with neuropathy and hyperglycemia: A1c 6.6%.  On 70/30-40 units in the morning and 60 units in the evening and metformin at home. Recent Labs  Lab 04/01/21 1508 04/01/21 2144 04/02/21 0806 04/02/21 1151  GLUCAP 164* 171* 158* 185*  -Continue SSI-moderate -Added NovoLog 4 units AC -On Lantus 10 units twice daily starting tonight -Continue pravastatin   OSA on CPAP -Encourage nightly CPAP   GERD -P.o. Protonix   Chronic back pain: Stable.  No red flags. -As needed Tylenol   BPH with LUTS -Consider initiating Flomax prior to discharge  Hematuria: UA with large Hgb and > 50 RBC.  H&H stable. -May need repeat UA in 4 to 6 weeks, and outpatient urology follow-up.  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 -Encourage lifestyle change to lose weight  DVT prophylaxis:  SCD's Start: 04/01/21 1850  Code Status: Full code Family Communication: Patient and/or RN. Available if any question.  Level of care: Telemetry Medical Status is: Observation  The patient will require care spanning > 2 midnights and should be moved to inpatient because: Hemodynamically unstable, Ongoing diagnostic testing needed not appropriate for outpatient work up, IV treatments appropriate due to intensity of illness or inability to take PO, and Inpatient level of care appropriate due to severity of illness  Dispo: The patient is from: Home               Anticipated d/c is to:  To be determined              Patient currently is not medically stable to d/c.   Difficult to place patient No       Consultants:  Neurology Cardiology   Sch Meds:  Scheduled Meds:  insulin aspart  0-15 Units Subcutaneous TID WC   insulin aspart  0-5 Units Subcutaneous QHS   insulin aspart  4 Units Subcutaneous TID WC   insulin glargine  10 Units Subcutaneous BID   Omega-3  1,000 mg Oral QPM   pantoprazole  40 mg Oral Daily   pravastatin  20 mg Oral Daily   PreserVision/Lutein   Oral Daily   Continuous Infusions:  sodium chloride     cefTRIAXone (ROCEPHIN)  IV Stopped (04/01/21 2229)   PRN Meds:.acetaminophen **OR** acetaminophen (TYLENOL) oral liquid 160 mg/5 mL **OR** acetaminophen, senna-docusate  Antimicrobials: Anti-infectives (From admission, onward)    Start     Dose/Rate Route Frequency Ordered Stop   04/01/21 1900  cefTRIAXone (ROCEPHIN) 2  g in sodium chloride 0.9 % 100 mL IVPB        2 g 200 mL/hr over 30 Minutes Intravenous Every 24 hours 04/01/21 1852          I have personally reviewed the following labs and images: CBC: Recent Labs  Lab 04/01/21 1511 04/01/21 1517 04/02/21 0834  WBC 24.3*  --  16.2*  NEUTROABS 20.2*  --  12.6*  HGB 11.1* 11.9* 11.1*  HCT 35.2* 35.0* 34.9*  MCV 83.4  --  81.9  PLT 258  --  252   BMP &GFR Recent Labs  Lab 04/01/21 1511 04/01/21 1517 04/02/21 0834  NA 135 136 135  K 4.9 4.8 4.4  CL 103 105 102  CO2 18*  --  22  GLUCOSE 158* 154* 163*  BUN 26* 27* 26*  CREATININE 2.00* 2.00* 1.39*  CALCIUM 8.4*  --  8.5*  MG  --   --  1.9  PHOS  --   --  3.0   Estimated Creatinine Clearance: 49.6 mL/min (A) (by C-G formula based on SCr of 1.39 mg/dL (H)). Liver & Pancreas: Recent Labs  Lab 04/01/21 1511 04/02/21 0834  AST 22  --   ALT 14  --   ALKPHOS 63  --   BILITOT 1.0  --   PROT 7.0  --   ALBUMIN 3.0* 2.8*   No results for input(s): LIPASE, AMYLASE in the last 168 hours. No  results for input(s): AMMONIA in the last 168 hours. Diabetic: Recent Labs    04/02/21 0420  HGBA1C 6.6*   Recent Labs  Lab 04/01/21 1508 04/01/21 2144 04/02/21 0806 04/02/21 1151  GLUCAP 164* 171* 158* 185*   Cardiac Enzymes: No results for input(s): CKTOTAL, CKMB, CKMBINDEX, TROPONINI in the last 168 hours. No results for input(s): PROBNP in the last 8760 hours. Coagulation Profile: Recent Labs  Lab 04/01/21 1511  INR 1.7*   Thyroid Function Tests: No results for input(s): TSH, T4TOTAL, FREET4, T3FREE, THYROIDAB in the last 72 hours. Lipid Profile: Recent Labs    04/02/21 0420  CHOL 118  HDL 17*  LDLCALC 50  TRIG 732*  CHOLHDL 6.9   Anemia Panel: No results for input(s): VITAMINB12, FOLATE, FERRITIN, TIBC, IRON, RETICCTPCT in the last 72 hours. Urine analysis:    Component Value Date/Time   COLORURINE YELLOW 04/02/2021 0040   APPEARANCEUR HAZY (A) 04/02/2021 0040   APPEARANCEUR Clear 01/29/2012 1600   LABSPEC 1.042 (H) 04/02/2021 0040   LABSPEC 1.010 01/29/2012 1600   PHURINE 5.0 04/02/2021 0040   GLUCOSEU NEGATIVE 04/02/2021 0040   GLUCOSEU 50 mg/dL 20/25/4270 6237   HGBUR LARGE (A) 04/02/2021 0040   BILIRUBINUR NEGATIVE 04/02/2021 0040   BILIRUBINUR Negative 01/29/2012 1600   KETONESUR 5 (A) 04/02/2021 0040   PROTEINUR 100 (A) 04/02/2021 0040   NITRITE NEGATIVE 04/02/2021 0040   LEUKOCYTESUR NEGATIVE 04/02/2021 0040   LEUKOCYTESUR Negative 01/29/2012 1600   Sepsis Labs: Invalid input(s): PROCALCITONIN, LACTICIDVEN  Microbiology: Recent Results (from the past 240 hour(s))  Resp Panel by RT-PCR (Flu A&B, Covid) Nasopharyngeal Swab     Status: None   Collection Time: 04/01/21  5:16 PM   Specimen: Nasopharyngeal Swab; Nasopharyngeal(NP) swabs in vial transport medium  Result Value Ref Range Status   SARS Coronavirus 2 by RT PCR NEGATIVE NEGATIVE Final    Comment: (NOTE) SARS-CoV-2 target nucleic acids are NOT DETECTED.  The SARS-CoV-2 RNA is  generally detectable in upper respiratory specimens during the acute phase of infection. The  lowest concentration of SARS-CoV-2 viral copies this assay can detect is 138 copies/mL. A negative result does not preclude SARS-Cov-2 infection and should not be used as the sole basis for treatment or other patient management decisions. A negative result may occur with  improper specimen collection/handling, submission of specimen other than nasopharyngeal swab, presence of viral mutation(s) within the areas targeted by this assay, and inadequate number of viral copies(<138 copies/mL). A negative result must be combined with clinical observations, patient history, and epidemiological information. The expected result is Negative.  Fact Sheet for Patients:  BloggerCourse.com  Fact Sheet for Healthcare Providers:  SeriousBroker.it  This test is no t yet approved or cleared by the [place] FDA and  has been authorized for detection and/or diagnosis of SARS-CoV-2 by FDA under an Emergency Use Authorization (EUA). This EUA will remain  in effect (meaning this test can be used) for the duration of the COVID-19 declaration under Section 564(b)(1) of the Act, 21 U.S.C.section 360bbb-3(b)(1), unless the authorization is terminated  or revoked sooner.       Influenza A by PCR NEGATIVE NEGATIVE Final   Influenza B by PCR NEGATIVE NEGATIVE Final    Comment: (NOTE) The Xpert Xpress SARS-CoV-2/FLU/RSV plus assay is intended as an aid in the diagnosis of influenza from Nasopharyngeal swab specimens and should not be used as a sole basis for treatment. Nasal washings and aspirates are unacceptable for Xpert Xpress SARS-CoV-2/FLU/RSV testing.  Fact Sheet for Patients: BloggerCourse.com  Fact Sheet for Healthcare Providers: SeriousBroker.it  This test is not yet approved or cleared by the [place] FDA and has been authorized for detection and/or diagnosis of SARS-CoV-2 by FDA under an Emergency Use Authorization (EUA). This EUA will remain in effect (meaning this test can be used) for the duration of the COVID-19 declaration under Section 564(b)(1) of the Act, 21 U.S.C. section 360bbb-3(b)(1), unless the authorization is terminated or revoked.  Performed at [facility], [address], [place], [place] [address]   Blood culture (routine x 2)     Status: None (Preliminary result)   Collection Time: 04/01/21  6:40 PM   Specimen: BLOOD  Result Value Ref Range Status   Specimen Description BLOOD SITE NOT SPECIFIED  Final   Special Requests   Final    BOTTLES DRAWN AEROBIC AND ANAEROBIC Blood Culture results may not be optimal due to an inadequate volume of blood received in culture bottles   Culture   Final    NO GROWTH < 12 HOURS Performed at [facility], [address] [place], [place] [address]    Report Status PENDING  Incomplete  Blood culture (routine x 2)     Status: None (Preliminary result)   Collection Time: 04/01/21  6:48 PM   Specimen: BLOOD  Result Value Ref Range Status   Specimen Description BLOOD SITE NOT SPECIFIED  Final   Special Requests   Final    BOTTLES DRAWN AEROBIC AND ANAEROBIC Blood Culture results may not be optimal due to an inadequate volume of blood received in culture bottles   Culture   Final    NO GROWTH < 12 HOURS Performed at [facility], [address] [place], [place] [address]    Report Status PENDING  Incomplete    Radiology Studies: CT HEAD CODE STROKE WO CONTRAST  Result Date: 04/01/2021 CLINICAL DATA:  Code stroke.  Aphasia EXAM: CT HEAD WITHOUT CONTRAST CT ANGIOGRAPHY OF THE HEAD AND NECK TECHNIQUE: Contiguous  axial images were obtained from the base of the skull through the vertex without intravenous contrast. Multidetector CT imaging of the head and neck was performed using the standard protocol  during bolus administration of intravenous contrast. Multiplanar CT image reconstructions and MIPs were obtained to evaluate the vascular anatomy. Carotid stenosis measurements (when applicable) are obtained utilizing NASCET criteria, using the distal internal carotid diameter as the denominator. CONTRAST:  60mL OMNIPAQUE IOHEXOL 350 MG/ML SOLN COMPARISON:  CT head 01/21/2021, MRI head 02/17/2021, MRA head 02/17/2021 FINDINGS: CT HEAD Brain: There is no acute intracranial hemorrhage, mass effect, or edema. Gray-white differentiation is preserved. Patchy and confluent areas of hypoattenuation in the supratentorial white matter are nonspecific but probably reflect moderate chronic microvascular ischemic changes unchanged from prior. There is no extra-axial fluid collection. Ventricles and sulci are stable in size and configuration. Vascular: No hyperdense vessel. Skull: Calvarium is unremarkable. Sinuses/Orbits: No acute finding. Other: None. Review of the MIP images confirms the above findings CTA NECK Aortic arch: Mild calcified plaque along the arch. There is direct origin of the left vertebral artery from the arch. Calcified plaque is present at the origin with apparent marked stenosis (there is some streak artifact through this region) but no flow limitation. Right carotid system: Patent. Mixed plaque at the external carotid origin with high-grade stenosis. Trace calcified plaque along the ICA origin without stenosis. Left carotid system: Patent. Calcified plaque with high-grade stenosis at the external carotid origin. Trace plaque at the ICA origin without stenosis. Vertebral arteries: Patent and codominant. Skeleton: Degenerative changes of the cervical spine. Bulky anterior osteophytes. Other neck: Unremarkable. Upper chest: Included upper lungs are clear. Review of the MIP images confirms the above findings CTA HEAD Anterior circulation: Intracranial internal carotid arteries are patent with calcified plaque  along cavernous and proximal supraclinoid portions causing mild to moderate stenosis, right greater than left. Anterior and middle cerebral arteries are patent. Posterior circulation: Intracranial vertebral arteries are patent. Basilar artery is patent. Major cerebellar artery origins are patent. Posterior cerebral arteries are patent. Moderate stenosis of inferior right distal P2 PCA branch. Venous sinuses: Patent as allowed by contrast bolus timing. Review of the MIP images confirms the above findings IMPRESSION: There is no acute intracranial hemorrhage or evidence of acute infarction. ASPECT score is 10. No large vessel occlusion. Minimal plaque at the ICA origins without stenosis. 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 Marked stenosis of an inferior right P2 PCA branch. These results were communicated to Dr. Roda Shutters at 3:36 pm on 04/01/2021 by text page via the [facility] messaging system. Electronically Signed   By: Guadlupe Spanish M.D.   On: 04/01/2021 15:49   CT ANGIO HEAD NECK W WO CM (CODE STROKE)  Result Date: 04/01/2021 CLINICAL DATA:  Code stroke.  Aphasia EXAM: CT HEAD WITHOUT CONTRAST CT ANGIOGRAPHY OF THE HEAD AND NECK TECHNIQUE: Contiguous axial images were obtained from the base of the skull through the vertex without intravenous contrast. Multidetector CT imaging of the head and neck was performed using the standard protocol during bolus administration of intravenous contrast. Multiplanar CT image reconstructions and MIPs were obtained to evaluate the vascular anatomy. Carotid stenosis measurements (when applicable) are obtained utilizing NASCET criteria, using the distal internal carotid diameter as the denominator. CONTRAST:  60mL OMNIPAQUE IOHEXOL 350 MG/ML SOLN COMPARISON:  CT head 01/21/2021, MRI head 02/17/2021, MRA head 02/17/2021 FINDINGS: CT HEAD Brain: There is no acute intracranial hemorrhage, mass effect, or edema. Gray-white differentiation is preserved. Patchy and  confluent areas of hypoattenuation in the supratentorial white matter are nonspecific but probably reflect moderate chronic microvascular ischemic changes unchanged from prior. There is no extra-axial fluid collection. Ventricles and sulci are stable in size and configuration. Vascular: No hyperdense vessel. Skull: Calvarium is unremarkable. Sinuses/Orbits: No acute finding. Other: None. Review of the MIP images confirms the above findings CTA NECK Aortic arch: Mild calcified plaque along the arch. There is direct origin of the left vertebral artery from the arch. Calcified plaque is present at the origin with apparent marked stenosis (there is some streak artifact through this region) but no flow limitation. Right carotid system: Patent. Mixed plaque at the external carotid origin with high-grade stenosis. Trace calcified plaque along the ICA origin without stenosis. Left carotid system: Patent. Calcified plaque with high-grade stenosis at the external carotid origin. Trace plaque at the ICA origin without stenosis. Vertebral arteries: Patent and codominant. Skeleton: Degenerative changes of the cervical spine. Bulky anterior osteophytes. Other neck: Unremarkable. Upper chest: Included upper lungs are clear. Review of the MIP images confirms the above findings CTA HEAD Anterior circulation: Intracranial internal carotid arteries are patent with calcified plaque along cavernous and proximal supraclinoid portions causing mild to moderate stenosis, right greater than left. Anterior and middle cerebral arteries are patent. Posterior circulation: Intracranial vertebral arteries are patent. Basilar artery is patent. Major cerebellar artery origins are patent. Posterior cerebral arteries are patent. Moderate stenosis of inferior right distal P2 PCA branch. Venous sinuses: Patent as allowed by contrast bolus timing. Review of the MIP images confirms the above findings IMPRESSION: There is no acute intracranial hemorrhage  or evidence of acute infarction. ASPECT score is 10. No large vessel occlusion. Minimal plaque at the ICA origins without stenosis. 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 Marked stenosis of an inferior right P2 PCA branch. These results were communicated to Dr. Roda Shutters at 3:36 pm on 04/01/2021 by text page via the [facility] messaging system. Electronically Signed   By: Guadlupe Spanish M.D.   On: 04/01/2021 15:49       Damir Leung T. Gabrielle Mester [facility]  If 7PM-7AM, please contact night-coverage [email or web address] 04/02/2021, 1:03 PM

## 2021-04-02 NOTE — Progress Notes (Signed)
OT Cancellation Note  Patient Details Name: Harry [name] MRN: [id number] DOB: 06/06/1941   Cancelled Treatment:    Reason Eval/Treat Not Completed: Patient at procedure or test/ unavailable (Will follow up later time)  [facility] 04/02/2021, 1:45 PM Luisa Dago, OT/L   Acute OT Clinical Specialist Acute Rehabilitation Services Pager [phone] Office [phone]

## 2021-04-02 NOTE — ED Notes (Signed)
RT called to place Cpap. Unable to come at this time. Will come as soon as possible.

## 2021-04-02 NOTE — Plan of Care (Signed)
  Problem: Clinical Measurements: Goal: Respiratory complications will improve Outcome: Progressing   Problem: Clinical Measurements: Goal: Cardiovascular complication will be avoided Outcome: Progressing   Problem: Elimination: Goal: Will not experience complications related to bowel motility Outcome: Progressing   Problem: Elimination: Goal: Will not experience complications related to urinary retention Outcome: Progressing   

## 2021-04-02 NOTE — ED Notes (Signed)
PT in working with patient 

## 2021-04-02 NOTE — ED Notes (Signed)
Updated daughter on pt's POC.

## 2021-04-02 NOTE — Progress Notes (Signed)
PT Cancellation Note  Patient Details Name: Harry [name] MRN: [id number] DOB: 1941-08-09   Cancelled Treatment:    Reason Eval/Treat Not Completed: Patient at procedure or test/unavailable Pt just starting Echo.  Will f/u later today as able.  Anise Salvo, PT Acute Rehab Services Pager [phone] [facility] [phone]   Rayetta Humphrey 04/02/2021, 12:44 PM

## 2021-04-03 ENCOUNTER — Encounter (HOSPITAL_COMMUNITY): Payer: Self-pay | Admitting: Student

## 2021-04-03 ENCOUNTER — Inpatient Hospital Stay (HOSPITAL_COMMUNITY): Payer: Medicare HMO

## 2021-04-03 DIAGNOSIS — N390 Urinary tract infection, site not specified: Secondary | ICD-10-CM | POA: Diagnosis present

## 2021-04-03 DIAGNOSIS — R4701 Aphasia: Secondary | ICD-10-CM | POA: Diagnosis present

## 2021-04-03 DIAGNOSIS — G8929 Other chronic pain: Secondary | ICD-10-CM | POA: Diagnosis present

## 2021-04-03 DIAGNOSIS — Z7901 Long term (current) use of anticoagulants: Secondary | ICD-10-CM | POA: Diagnosis not present

## 2021-04-03 DIAGNOSIS — B961 Klebsiella pneumoniae [K. pneumoniae] as the cause of diseases classified elsewhere: Secondary | ICD-10-CM | POA: Diagnosis present

## 2021-04-03 DIAGNOSIS — K219 Gastro-esophageal reflux disease without esophagitis: Secondary | ICD-10-CM | POA: Diagnosis present

## 2021-04-03 DIAGNOSIS — E1165 Type 2 diabetes mellitus with hyperglycemia: Secondary | ICD-10-CM | POA: Diagnosis present

## 2021-04-03 DIAGNOSIS — Z950 Presence of cardiac pacemaker: Secondary | ICD-10-CM

## 2021-04-03 DIAGNOSIS — N401 Enlarged prostate with lower urinary tract symptoms: Secondary | ICD-10-CM | POA: Diagnosis present

## 2021-04-03 DIAGNOSIS — I10 Essential (primary) hypertension: Secondary | ICD-10-CM | POA: Diagnosis present

## 2021-04-03 DIAGNOSIS — E11319 Type 2 diabetes mellitus with unspecified diabetic retinopathy without macular edema: Secondary | ICD-10-CM | POA: Diagnosis present

## 2021-04-03 DIAGNOSIS — I495 Sick sinus syndrome: Secondary | ICD-10-CM | POA: Diagnosis present

## 2021-04-03 DIAGNOSIS — G629 Polyneuropathy, unspecified: Secondary | ICD-10-CM | POA: Diagnosis present

## 2021-04-03 DIAGNOSIS — Z8673 Personal history of transient ischemic attack (TIA), and cerebral infarction without residual deficits: Secondary | ICD-10-CM | POA: Diagnosis not present

## 2021-04-03 DIAGNOSIS — G9349 Other encephalopathy: Secondary | ICD-10-CM | POA: Diagnosis present

## 2021-04-03 DIAGNOSIS — G4733 Obstructive sleep apnea (adult) (pediatric): Secondary | ICD-10-CM | POA: Diagnosis present

## 2021-04-03 DIAGNOSIS — I350 Nonrheumatic aortic (valve) stenosis: Secondary | ICD-10-CM | POA: Diagnosis present

## 2021-04-03 DIAGNOSIS — I482 Chronic atrial fibrillation, unspecified: Secondary | ICD-10-CM

## 2021-04-03 DIAGNOSIS — E872 Acidosis: Secondary | ICD-10-CM | POA: Diagnosis present

## 2021-04-03 DIAGNOSIS — G459 Transient cerebral ischemic attack, unspecified: Secondary | ICD-10-CM | POA: Diagnosis not present

## 2021-04-03 DIAGNOSIS — R2990 Unspecified symptoms and signs involving the nervous system: Secondary | ICD-10-CM | POA: Diagnosis present

## 2021-04-03 DIAGNOSIS — R001 Bradycardia, unspecified: Secondary | ICD-10-CM | POA: Diagnosis not present

## 2021-04-03 DIAGNOSIS — N3001 Acute cystitis with hematuria: Secondary | ICD-10-CM | POA: Diagnosis not present

## 2021-04-03 DIAGNOSIS — E119 Type 2 diabetes mellitus without complications: Secondary | ICD-10-CM | POA: Diagnosis not present

## 2021-04-03 DIAGNOSIS — Z6834 Body mass index (BMI) 34.0-34.9, adult: Secondary | ICD-10-CM | POA: Diagnosis not present

## 2021-04-03 DIAGNOSIS — N179 Acute kidney failure, unspecified: Secondary | ICD-10-CM | POA: Diagnosis present

## 2021-04-03 DIAGNOSIS — I480 Paroxysmal atrial fibrillation: Secondary | ICD-10-CM | POA: Diagnosis present

## 2021-04-03 DIAGNOSIS — R319 Hematuria, unspecified: Secondary | ICD-10-CM

## 2021-04-03 DIAGNOSIS — E669 Obesity, unspecified: Secondary | ICD-10-CM | POA: Diagnosis present

## 2021-04-03 DIAGNOSIS — I6521 Occlusion and stenosis of right carotid artery: Secondary | ICD-10-CM | POA: Diagnosis present

## 2021-04-03 DIAGNOSIS — L89151 Pressure ulcer of sacral region, stage 1: Secondary | ICD-10-CM | POA: Diagnosis present

## 2021-04-03 DIAGNOSIS — Z20822 Contact with and (suspected) exposure to covid-19: Secondary | ICD-10-CM | POA: Diagnosis present

## 2021-04-03 LAB — CBC
HCT: 29.9 % — ABNORMAL LOW (ref 39.0–52.0)
Hemoglobin: 9.7 g/dL — ABNORMAL LOW (ref 13.0–17.0)
MCH: 25.9 pg — ABNORMAL LOW (ref 26.0–34.0)
MCHC: 32.4 g/dL (ref 30.0–36.0)
MCV: 79.9 fL — ABNORMAL LOW (ref 80.0–100.0)
Platelets: 214 10*3/uL (ref 150–400)
RBC: 3.74 MIL/uL — ABNORMAL LOW (ref 4.22–5.81)
RDW: 17.1 % — ABNORMAL HIGH (ref 11.5–15.5)
WBC: 12.3 10*3/uL — ABNORMAL HIGH (ref 4.0–10.5)
nRBC: 0 % (ref 0.0–0.2)

## 2021-04-03 LAB — GLUCOSE, CAPILLARY
Glucose-Capillary: 188 mg/dL — ABNORMAL HIGH (ref 70–99)
Glucose-Capillary: 193 mg/dL — ABNORMAL HIGH (ref 70–99)
Glucose-Capillary: 173 mg/dL — ABNORMAL HIGH (ref 70–99)
Glucose-Capillary: 258 mg/dL — ABNORMAL HIGH (ref 70–99)

## 2021-04-03 LAB — RENAL FUNCTION PANEL
Albumin: 2.4 g/dL — ABNORMAL LOW (ref 3.5–5.0)
Anion gap: 7 (ref 5–15)
BUN: 26 mg/dL — ABNORMAL HIGH (ref 8–23)
CO2: 22 mmol/L (ref 22–32)
Calcium: 8 mg/dL — ABNORMAL LOW (ref 8.9–10.3)
Chloride: 105 mmol/L (ref 98–111)
Creatinine, Ser: 1.15 mg/dL (ref 0.61–1.24)
GFR, Estimated: >60 mL/min (ref >60)
Glucose, Bld: 178 mg/dL — ABNORMAL HIGH (ref 70–99)
Phosphorus: 2.4 mg/dL — ABNORMAL LOW (ref 2.5–4.6)
Potassium: 4.1 mmol/L (ref 3.5–5.1)
Sodium: 134 mmol/L — ABNORMAL LOW (ref 135–145)

## 2021-04-03 LAB — MAGNESIUM: Magnesium: 2 mg/dL (ref 1.7–2.4)

## 2021-04-03 IMAGING — MR MR HEAD W/O CM
12 of 13 series · 44 of 48 positions shown · non-contrast
Comparison: CT angiogram head/neck and non-contrast CT head
[DATE]. MRI/MRA head [DATE].

CLINICAL DATA: Neuro deficit, acute, stroke suspected.

EXAM:
MRI HEAD WITHOUT CONTRAST
TECHNIQUE: Multiplanar, multiecho pulse sequences of the brain and surrounding
structures were obtained without intravenous contrast.

[Series 5: DWI · axial · 3.0mm · 0.88mm/px · z∈[-84,+72]mm · 8 of 108 slices shown (1 of 4)]
[im 1/108]
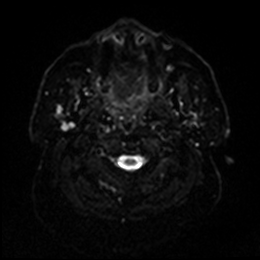
[im 16/108]
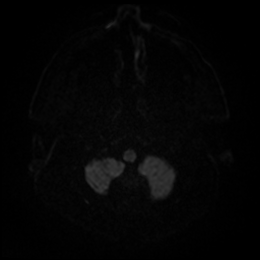
[im 31/108]
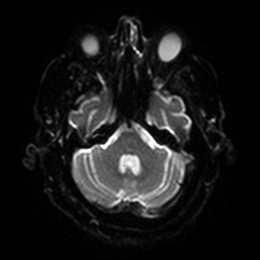
[im 46/108]
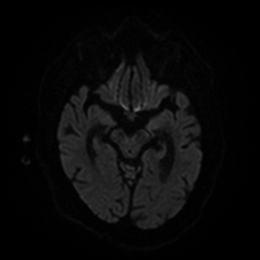
[im 62/108]
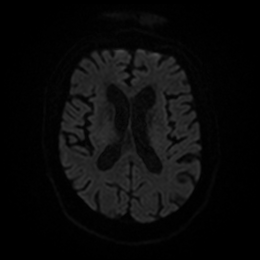
[im 77/108]
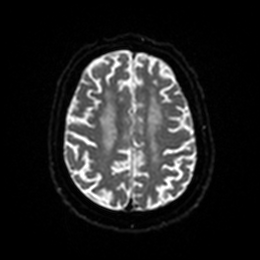
[im 92/108]
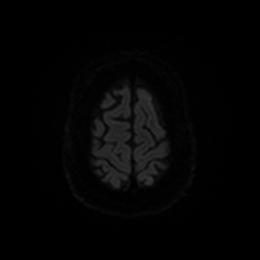
[im 108/108]
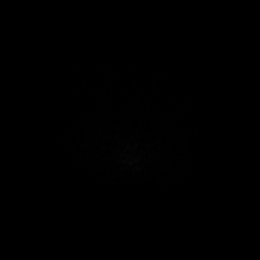

[Series 6: DWI · axial · 3.0mm · 0.88mm/px · z∈[-84,+72]mm · 4 of 54 slices shown (2 of 4)]
[im 1/54]
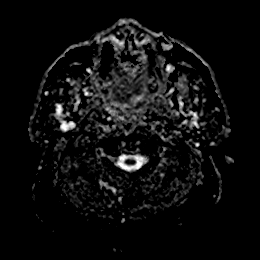
[im 18/54]
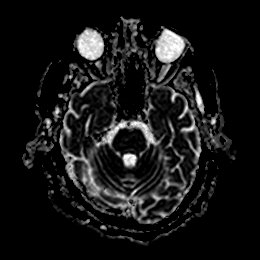
[im 36/54]
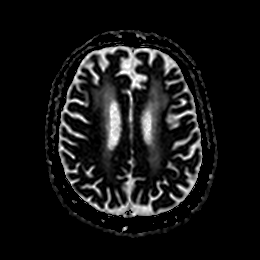
[im 54/54]
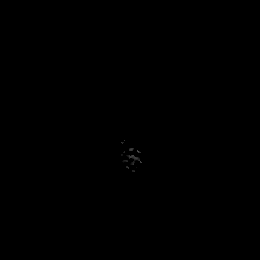

[Series 7: DWI · coronal · 4.0mm · 0.88mm/px · 5 of 72 slices shown (3 of 4)]
[im 1/72]
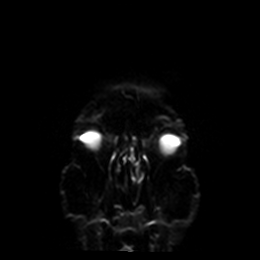
[im 18/72]
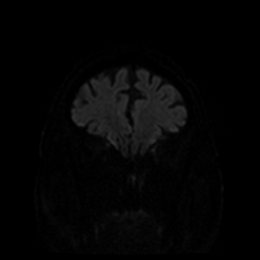
[im 36/72]
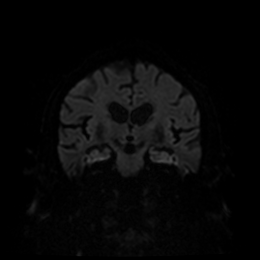
[im 54/72]
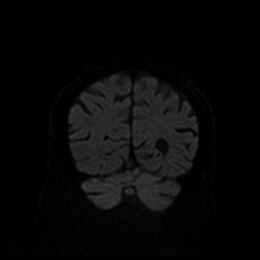
[im 72/72]
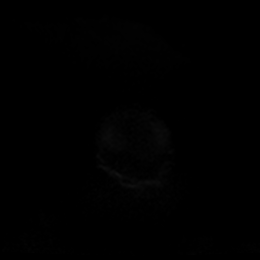

[Series 8: DWI · coronal · 4.0mm · 0.88mm/px · 3 of 36 slices shown (4 of 4)]
[im 1/36]
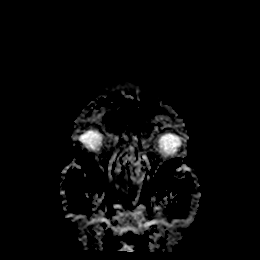
[im 18/36]
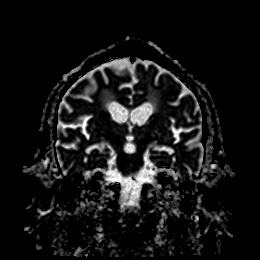
[im 36/36]
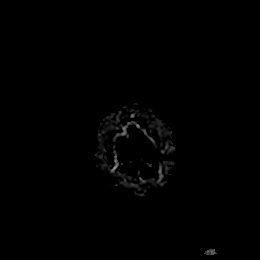

[Series 9: T1 · sagittal · 5.0mm · 0.78mm/px · 2 of 23 slices shown]
[im 1/23]
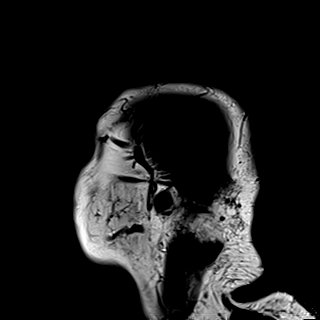
[im 23/23]
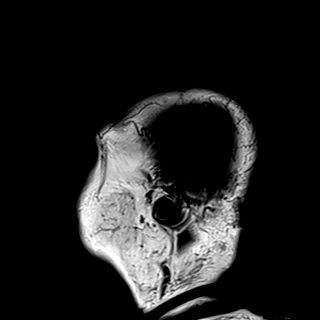

[Series 10: T2 · axial · 5.0mm · 0.72mm/px · z∈[-82,+59]mm · 2 of 25 slices shown (1 of 2)]
[im 1/25]
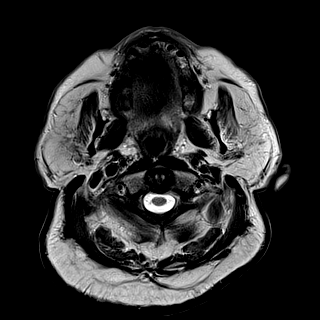
[im 25/25]
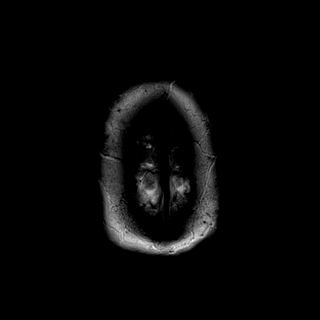

[Series 11: FLAIR · axial · 5.0mm · 0.45mm/px · z∈[-80,+62]mm · 2 of 25 slices shown]
[im 1/25]
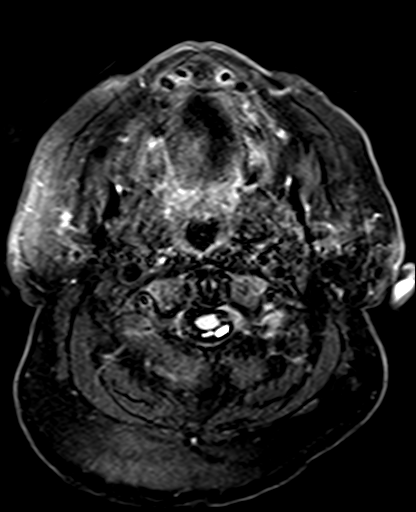
[im 25/25]
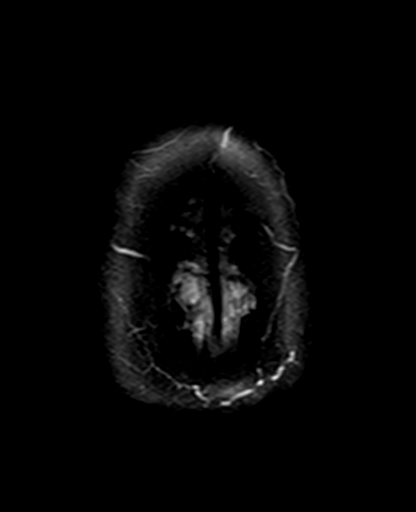

[Series 12: mag_images · axial · 3.0mm · 0.90mm/px · z∈[-87,+86]mm · 4 of 60 slices shown]
[im 1/60]
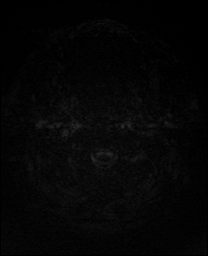
[im 20/60]
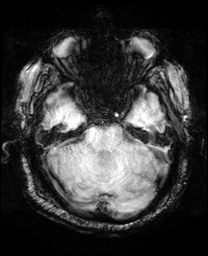
[im 40/60]
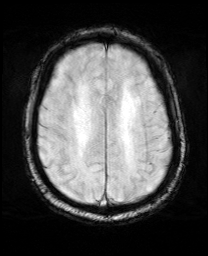
[im 60/60]
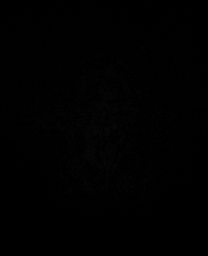

[Series 13: pha_images · axial · 3.0mm · 0.90mm/px · z∈[-81,+86]mm · 4 of 58 slices shown]
[im 1/58]
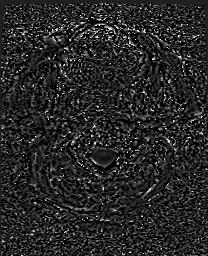
[im 20/58]
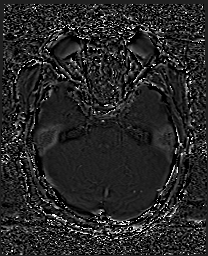
[im 39/58]
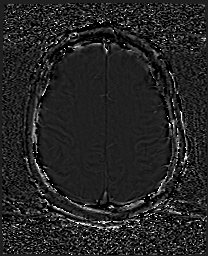
[im 58/58]
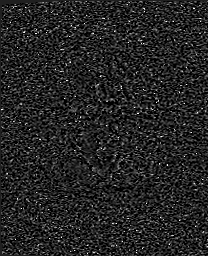

[Series 14: swi_images · axial · 3.0mm · 0.90mm/px · z∈[-87,+86]mm · 4 of 60 slices shown]
[im 1/60]
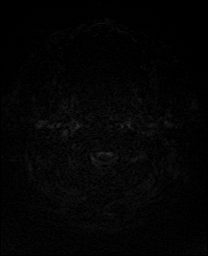
[im 20/60]
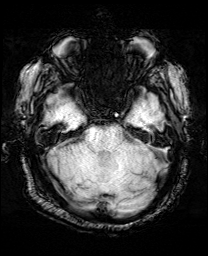
[im 40/60]
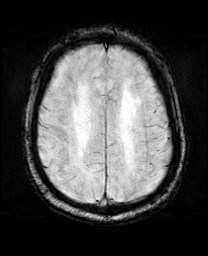
[im 60/60]
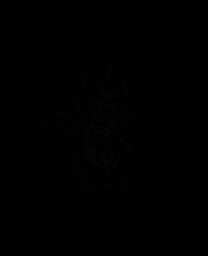

[Series 15: mip_images(sw) · axial · 24.0mm · 0.90mm/px · z∈[-77,+76]mm · 4 of 53 slices shown]
[im 1/53]
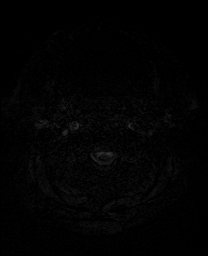
[im 18/53]
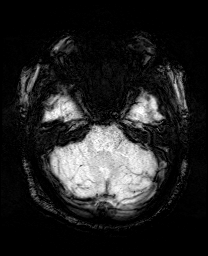
[im 35/53]
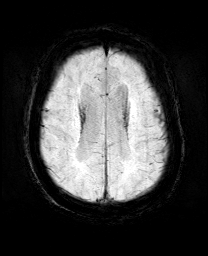
[im 53/53]
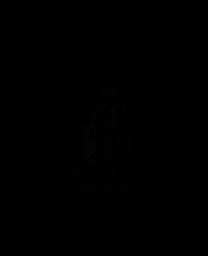

[Series 17: T2 · coronal · 5.0mm · 0.34mm/px · 2 of 29 slices shown (2 of 2)]
[im 1/29]
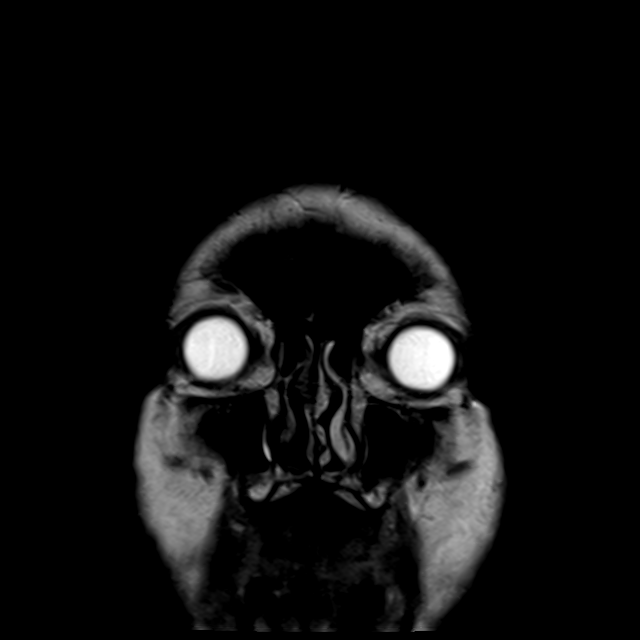
[im 29/29]
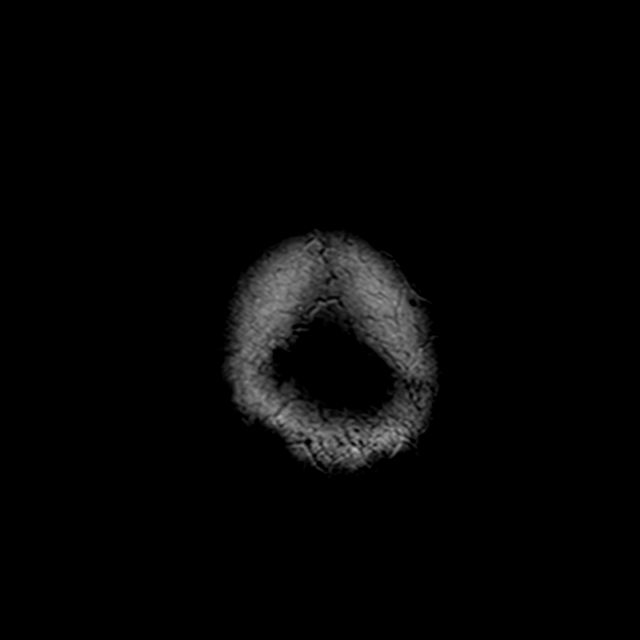

[44 of 48 positions shown; findings below may reference images not displayed]

FINDINGS: Brain:

Moderate generalized cerebral atrophy. Commensurate prominence of
the ventricles and sulci. Comparatively mild cerebellar atrophy.

Chronic lacunar infarcts within the bilateral caudate nuclei.

Moderate patchy and confluent T2/FLAIR hyperintensity within the
cerebral white matter, nonspecific but compatible chronic small
vessel ischemic disease.

2 unchanged punctate chronic microhemorrhages within the left
occipital lobe (series 14, image 23).

There is no acute infarct.

No evidence of an intracranial mass.

No extra-axial fluid collection.

No midline shift.

Vascular: Expected proximal arterial flow voids.

Skull and upper cervical spine: No focal marrow lesion. Incompletely
assessed cervical spondylosis.

Sinuses/Orbits: Visualized orbits show no acute finding. Bilateral
lens replacements. Trace bilateral ethmoid sinus mucosal thickening.
IMPRESSION: No evidence of acute intracranial abnormality

Stable non-contrast MRI appearance of the brain as compared to
[DATE].

Moderate chronic small vessel ischemic changes within the cerebral
white matter.

Chronic lacunar infarcts within the bilateral caudate nuclei.

Moderate generalized cerebral atrophy. Comparatively mild cerebellar
atrophy.

## 2021-04-03 MED ORDER — INSULIN GLARGINE 100 UNIT/ML ~~LOC~~ SOLN
13.0000 [IU] | Freq: Two times a day (BID) | SUBCUTANEOUS | Status: DC
  Administered 2021-04-03 – 2021-04-04 (×2): 13 [IU] via SUBCUTANEOUS
  Filled 2021-04-03 (×3): qty 0.13

## 2021-04-03 MED ORDER — INSULIN ASPART 100 UNIT/ML IJ SOLN
6.0000 [IU] | Freq: Three times a day (TID) | INTRAMUSCULAR | Status: DC
  Administered 2021-04-03 (×2): 6 [IU] via SUBCUTANEOUS

## 2021-04-03 NOTE — Progress Notes (Signed)
Physical Therapy Treatment Patient Details Name: Harry [name] MRN: [id number] DOB: [birth year] Today's Date: 04/03/2021    History of Present Illness Pt is 80 yo male admitted with slurred speech/expressive aphasia for CVA work up, AKI, and possible UTI on 04/01/21. 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    PT Comments    Patient initially wanting to stay in bed, however with wife's assistance persuaded pt that he really needed to get up and keep his strength up. Apparently wife assists pt with supine to sit and sit to stand (depending on the surface he's sitting on) at home. Pt required education for safe use of RW during transfers and ambulation. Can continue to benefit from PT (acute and HHPT) due to h/o falls.      Follow Up Recommendations  Home health PT;Supervision for mobility/OOB     Equipment Recommendations  None recommended by PT    Recommendations for Other Services       Precautions / Restrictions Precautions Precautions: Fall Precaution Comments: High Fall Risk    Mobility  Bed Mobility Overal bed mobility: Needs Assistance Bed Mobility: Rolling;Sidelying to Sit Rolling: Supervision Sidelying to sit: Min assist       General bed mobility comments: Able to roll to L with supervision A. Min A to elevate trunk with HOB elevated. Requires increased time/effort. Pt/wife report he always needs assist    Transfers Overall transfer level: Needs assistance Equipment used: Rolling walker (2 wheeled) Transfers: Sit to/from Stand Sit to Stand: Min guard         General transfer comment: Min guard x several trials from various surfaces with Min guard and cues for hand placement. Pt/iwfe report she often anchors RW for pt and he pulls on it to stand. Educated on safety issue  Ambulation/Gait Ambulation/Gait assistance: Min  Paediatric nurse (Feet): 74 Feet Assistive device: Rolling walker (2 wheeled) Gait Pattern/deviations: Decreased stride length;Step-through pattern;Trunk flexed Gait velocity: decreased   General Gait Details: pt tends to push RW too far ahead; poor retention when corrects   Stairs             Wheelchair Mobility    Modified Rankin (Stroke Patients Only) Modified Rankin (Stroke Patients Only) Pre-Morbid Rankin Score: Moderate disability Modified Rankin: Moderate disability     Balance Overall balance assessment: Needs assistance Sitting-balance support: No upper extremity supported Sitting balance-Leahy Scale: Good     Standing balance support: Bilateral upper extremity supported Standing balance-Leahy Scale: Fair Standing balance comment: Able to maintain static standing balance at sink surface without UE support during grooming tasks. Reliant on BUE on RW with dynamic balance.                            Cognition Arousal/Alertness: Awake/alert Behavior During Therapy: WFL for tasks assessed/performed Overall Cognitive Status: Within Functional Limits for tasks assessed                                 General Comments: Loquacious; requires redirection often      Exercises      General Comments General comments (skin integrity, edema, etc.): HR in 40s-50 throughout      Pertinent Vitals/Pain Pain Assessment: No/denies pain    Home Living  Prior Function            PT Goals (current goals can now be found in the care plan section) Acute Rehab PT Goals Patient Stated Goal: To get stronger Time For Goal Achievement: 04/16/21 Potential to Achieve Goals: Good Progress towards PT goals: Progressing toward goals    Frequency    Min 3X/week      PT Plan Discharge plan needs to be updated    Co-evaluation              AM-PAC PT "6 Clicks" Mobility   Outcome Measure  Help needed  turning from your back to your side while in a flat bed without using bedrails?: A Little Help needed moving from lying on your back to sitting on the side of a flat bed without using bedrails?: A Little Help needed moving to and from a bed to a chair (including a wheelchair)?: A Little Help needed standing up from a chair using your arms (e.g., wheelchair or bedside chair)?: A Little Help needed to walk in hospital room?: A Little Help needed climbing 3-5 steps with a railing? : A Little 6 Click Score: 18    End of Session Equipment Utilized During Treatment: Gait belt Activity Tolerance: Patient tolerated treatment well Patient left: with call bell/phone within reach;with family/visitor present;in chair (ED stretcher) Nurse Communication: Mobility status PT Visit Diagnosis: Unsteadiness on feet (R26.81);Muscle weakness (generalized) (M62.81)     Time: 2751-7001 PT Time Calculation (min) (ACUTE ONLY): 21 min  Charges:  $Gait Training: 8-22 mins                      Jerolyn Center, PT Pager [phone]    Zena Amos 04/03/2021, 1:21 PM

## 2021-04-03 NOTE — Evaluation (Signed)
Occupational Therapy Evaluation Patient Details Name: Harry [name] MRN: [id number] DOB: Sep 03, 1941 Today's Date: 04/03/2021    History of Present Illness Pt is 80 yo male admitted with slurred speech/expressive aphasia for CVA work up, AKI, and possible UTI on 04/01/21. CT head was negatvie for acute infarct, MRI on hold due to recent pacemaker. 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   Clinical Impression   PTA patient was living with family in a private residence. Patient reports requiring assistance with LB ADLs, ambulating short household distances with use of RW, and being grossly sedentary spending most days in bed. Patient currently functioning near baseline requiring Min A for bed mobility, Min A for hygiene/clothing management secondary to decreased AROM in shoulders bilaterally, and Min guard for ADL transfers with use of RW. Patient also limited by deficits listed below including poor activity tolerance, poor activity pacing, generalized weakness/debility, and decreased balance and would benefit from continued acute OT services in prep for safe d/c home with family. Speech deficits have resolved and no focal weakness noted. Patient interested in working with [facility] to maximize safety and independence with self-care tasks as he realizes he has gotten very weak over the last several months requiring increased assist from family. OT will continue to follow acutely.     Follow Up Recommendations  Home health OT;Supervision/Assistance - 24 hour    Equipment Recommendations  3 in 1 bedside commode    Recommendations for Other Services       Precautions / Restrictions Precautions Precautions: Fall Precaution Comments: High Fall Risk Restrictions Weight Bearing Restrictions: No      Mobility Bed Mobility Overal bed mobility: Needs Assistance Bed Mobility: Rolling;Sidelying to Sit Rolling: Supervision Sidelying to  sit: Min assist       General bed mobility comments: Able to roll to L with supervision A. Min A to elevate trunk with HOB elevated. Requires increased time/effort.    Transfers Overall transfer level: Needs assistance Equipment used: Rolling walker (2 wheeled) Transfers: Sit to/from Stand Sit to Stand: Min guard         General transfer comment: Min guard x several trials from various surfaces with Min guard and cues for hand placement.    Balance Overall balance assessment: Needs assistance Sitting-balance support: No upper extremity supported Sitting balance-Leahy Scale: Good     Standing balance support: Bilateral upper extremity supported Standing balance-Leahy Scale: Fair Standing balance comment: Able to maintain static standing balance at sink surface without UE support during grooming tasks. Reliant on BUE on RW with dynamic balance.                           ADL either performed or assessed with clinical judgement   ADL Overall ADL's : Needs assistance/impaired     Grooming: Min guard;Standing Grooming Details (indicate cue type and reason): 2/3 grooming tasks standing at sink level with Min guard to close supervision A for safety.             Lower Body Dressing: Minimal assistance;Sit to/from stand Lower Body Dressing Details (indicate cue type and reason): Able to don footwear in figure-4 position seated EOB with set-up assist. SOB after activity but noted poor pacing.             Functional mobility during ADLs: Min guard;Rolling walker General ADL Comments: Patient greatly limited by generalized debility and weakness;  sedentary at baseline     Vision Baseline Vision/History:  (Hx of visual deficits 2/2 DMII) Patient Visual Report: No change from baseline Additional Comments: States he is unable to read up close and has difficulty seeing the TV at home. Education provided on low vision strategies to increase safety and independence  with ADLs up d/c.     Perception     Praxis      Pertinent Vitals/Pain Pain Assessment: No/denies pain     Hand Dominance Right   Extremity/Trunk Assessment Upper Extremity Assessment Upper Extremity Assessment: RUE deficits/detail;LUE deficits/detail RUE Deficits / Details: AROM limited at shoulders bilaterally; Hx of OA; MMT 4/5 biceps, 3+/5 triceps, 4/5 gross grasp RUE Sensation: history of peripheral neuropathy RUE Coordination: decreased fine motor LUE Deficits / Details: AROM limited at shoulders bilaterally; Hx of OA; MMT 4/5 biceps, 3+/5 triceps, 4/5 gross grasp LUE Sensation: history of peripheral neuropathy LUE Coordination: decreased fine motor   Lower Extremity Assessment Lower Extremity Assessment: Defer to PT evaluation   Cervical / Trunk Assessment Cervical / Trunk Assessment: Other exceptions Cervical / Trunk Exceptions: Large body habitus   Communication Communication Communication: No difficulties   Cognition Arousal/Alertness: Awake/alert Behavior During Therapy: WFL for tasks assessed/performed Overall Cognitive Status: Within Functional Limits for tasks assessed                                 General Comments: Loquacious; requires redirection often   General Comments  HR in 50's throughout, SpO2 >90%. Desat to 88% with mobility but poor pleth noted. 3/4 DOE with activity.    Exercises     Shoulder Instructions      Home Living Family/patient expects to be discharged to:: Private residence Living Arrangements: Spouse/significant other Available Help at Discharge: Family;Available 24 hours/day Type of Home: House Home Access: Stairs to enter [employer] of Steps: 3 Entrance Stairs-Rails: Right;Left Home Layout: One level     Bathroom Shower/Tub: Other (comment) (walk-in tub)   Bathroom Toilet: Standard Bathroom Accessibility: No   Home Equipment: Grab bars - tub/shower;Shower seat - built in;Walker - 2  wheels;Cane - single point          Prior Functioning/Environment Level of Independence: Needs assistance  Gait / Transfers Assistance Needed: Ambulates in home with RW; reports get short of breath for past several months ADL's / Homemaking Assistance Needed: Wife assists with LB ADLs; reports completing shower transfers with I; sedentary at baseline   Comments: 2 recent falls (1 from neuropathy and 1 syncope prior to getting pacemaker)        OT Problem List: Decreased strength;Decreased range of motion;Decreased activity tolerance;Impaired balance (sitting and/or standing);Impaired vision/perception;Decreased coordination;Cardiopulmonary status limiting activity;Impaired sensation;Impaired UE functional use      OT Treatment/Interventions: Self-care/ADL training;Therapeutic exercise;Energy conservation;DME and/or AE instruction;Therapeutic activities;Patient/family education;Visual/perceptual remediation/compensation;Balance training    OT Goals(Current goals can be found in the care plan section) Acute Rehab OT Goals Patient Stated Goal: To get stronger OT Goal Formulation: With patient Time For Goal Achievement: 04/17/21 Potential to Achieve Goals: Good ADL Goals Additional ADL Goal #1: Patient will complete ADLs with supervision A and AE/DME demonstrating good activity pacing. Additional ADL Goal #2: Patient will tolerate 15 minutes of therapeutic activity without need for rest break indicating increased activity tolerance. Additional ADL Goal #3: Patient will recall 3 energy conservation techniques in prep for ADLs.  OT Frequency: Min 2X/week   Barriers to D/C:  Co-evaluation              AM-PAC OT "6 Clicks" Daily Activity     Outcome Measure Help from another person eating meals?: None Help from another person taking care of personal grooming?: A Little Help from another person toileting, which includes using toliet, bedpan, or urinal?: A Little Help  from another person bathing (including washing, rinsing, drying)?: A Little Help from another person to put on and taking off regular upper body clothing?: A Little Help from another person to put on and taking off regular lower body clothing?: A Little 6 Click Score: 19   End of Session Equipment Utilized During Treatment: Gait belt;Rolling walker Nurse Communication: Mobility status;Other (comment) (Response to treatment)  Activity Tolerance: Patient tolerated treatment well Patient left: in chair;with call bell/phone within reach;with chair alarm set  OT Visit Diagnosis: Unsteadiness on feet (R26.81);Repeated falls (R29.6);Muscle weakness (generalized) (M62.81)                Time: 0721-0750 OT Time Calculation (min): 29 min Charges:  OT General Charges $OT Visit: 1 Visit OT Evaluation $OT Eval Low Complexity: 1 Low OT Treatments $Self Care/Home Management : 8-22 mins  Hitesh Fouche H. OTR/L Supplemental OT, Department of rehab services [phone]  Wasil Wolke R H. 04/03/2021, 8:06 AM

## 2021-04-03 NOTE — Progress Notes (Signed)
SLP Cancellation Note  Patient Details Name: Harry [name] MRN: [id number] DOB: Sep 28, 1940   Cancelled treatment:       Reason Eval/Treat Not Completed: SLP screened, no needs identified, will sign off- back to baseline.  Evee Liska L. Samson Frederic, MA CCC/SLP Acute Rehabilitation Services Office number [phone] Pager [phone]    Blenda Mounts Laurice 04/03/2021, 4:01 PM

## 2021-04-03 NOTE — TOC Initial Note (Signed)
Transition of Care [facility]) - Initial/Assessment Note    Patient Details  Name: Harry [name] MRN: [id number] Date of Birth: [birth year]  [facility] CM/SW Contact:    Joanne Chars, LCSW Phone Number: 04/03/2021, 4:07 PM  Clinical Narrative:    CSW met with pt and wife regarding recommendation for [facility].  Permission given to speak with wife Dub Mikes present.  They are agreeable to [facility], choice document given, they would like to look at the agencies before making a choice.  Current DME in home: walker.  PCP in place.  Pt is vaccinated for covid with 2 booster shots.                 Expected Discharge Plan: [place] Barriers to Discharge: Continued Medical Work up   Patient Goals and CMS Choice Patient states their goals for this hospitalization and ongoing recovery are:: strength and mobility CMS Medicare.gov Compare Post Acute Care list provided to:: Patient Represenative (must comment) Choice offered to / list presented to : Spouse  Expected Discharge Plan and Services Expected Discharge Plan: [place] Choice: [place] arrangements for the past 2 months: Single Family Home                                      Prior Living Arrangements/Services Living arrangements for the past 2 months: Single Family Home Lives with:: Spouse Patient language and need for interpreter reviewed:: Yes Do you feel safe going back to the place where you live?: Yes      Need for Family Participation in Patient Care: Yes (Comment) Care giver support system in place?: Yes (comment) Current home services: Other (comment) (none) Criminal Activity/Legal Involvement Pertinent to Current Situation/Hospitalization: No - Comment as needed  Activities of Daily Living Home Assistive Devices/Equipment: Walker (specify type) (front wheel walker) ADL Screening (condition at time of admission) Patient's cognitive ability  adequate to safely complete daily activities?: Yes Is the patient deaf or have difficulty hearing?: No Does the patient have difficulty seeing, even when wearing glasses/contacts?: Yes Does the patient have difficulty concentrating, remembering, or making decisions?: Yes Patient able to express need for assistance with ADLs?: Yes Does the patient have difficulty dressing or bathing?: Yes Independently performs ADLs?: No Communication: Independent Dressing (OT): Needs assistance Is this a change from baseline?: Pre-admission baseline Grooming: Needs assistance Is this a change from baseline?: Pre-admission baseline Feeding: Independent Bathing: Needs assistance Is this a change from baseline?: Pre-admission baseline Toileting: Needs assistance Is this a change from baseline?: Pre-admission baseline In/Out Bed: Independent Walks in Home: Independent Does the patient have difficulty walking or climbing stairs?: Yes Weakness of Legs: Both Weakness of Arms/Hands: None  Permission Sought/Granted Permission sought to share information with : Family Supports Permission granted to share information with : Yes, Verbal Permission Granted  Share Information with NAME: wife Dub Mikes  Permission granted to share info w AGENCY: HH        Emotional Assessment Appearance:: Appears stated age Attitude/Demeanor/Rapport: Engaged Affect (typically observed): Appropriate Orientation: : Oriented to Self, Oriented to Place, Oriented to  Time, Oriented to Situation Alcohol / Substance Use: Not Applicable Psych Involvement: No (comment)  Admission diagnosis:  Dysuria [R30.0] Bradycardia [R00.1] Right leg weakness [R29.898] Dysarthria [R47.1] AKI (acute kidney injury) ([place]) [N17.9] Stroke-like symptom [R29.90] UTI (urinary tract infection) [  N39.0] Patient Active Problem List   Diagnosis Date Noted   UTI (urinary tract infection) 04/03/2021   Stroke-like symptom 04/01/2021   Cerebral embolism  with cerebral infarction 04/01/2021   Near syncope    Pressure injury of skin 02/20/2021   Symptomatic sinus bradycardia 02/17/2021   Dizziness 02/17/2021   Sinus pause 02/17/2021   Chronic anticoagulation 02/17/2021   BPH (benign prostatic hyperplasia) 01/21/2021   DDD (degenerative disc disease) 01/21/2021   Hypertensive retinopathy of both eyes 10/24/2018   Aortic stenosis, mild 07/06/2018   Proliferative diabetic retinopathy of both eyes with macular edema associated with type 2 diabetes mellitus ([place]) 88/91/6945   Systolic murmur 03/88/8280   Obstructive sleep apnea 03/05/2017   Paroxysmal atrial fibrillation ([place]) 03/05/2017   Coronary artery disease involving native coronary artery of native heart without angina pectoris 06/13/2014   Erectile dysfunction 06/13/2014   Essential hypertension 06/13/2014   Deficiency of other specified B group vitamins 04/24/2014   Hypertriglyceridemia 04/24/2014   Obesity 04/24/2014   Family history of colon cancer 03/06/2014   Insulin dependent type 2 diabetes mellitus, controlled ([place]) 05/01/2012   Pseudophakia, both eyes 05/01/2012   PCP:  Derinda Late, MD Pharmacy:   [id number] [address] [place] [address] Phone: [phone] Fax: [phone]     Social Determinants of Health (SDOH) Interventions    Readmission Risk Interventions No flowsheet data found.

## 2021-04-03 NOTE — Plan of Care (Signed)

## 2021-04-03 NOTE — Progress Notes (Signed)
Patient down from [facility] for MRI brain wo contrast. Patient has medtronic device. [facility] express sent and verbal orders received from Andy-Cardiology PA for VOO 60. Will re-program once scan is completed.

## 2021-04-03 NOTE — Progress Notes (Signed)
PROGRESS NOTE  Harry [name]:[id number] DOB: [birth year]   PCP: Kandyce Rud, MD  Patient is from: Home.  Lives with his wife.  Uses walker  DOA: 04/01/2021 LOS: 0  Chief complaints:  Chief Complaint  Patient presents with   Code Stroke     Brief Narrative / Interim history: 80 y.o. 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  CTH without acute finding.  CTA head and neck negative for LVO but mild to moderate right ICA stenosis and marked stenosis of an inferior right P2 PCA branch.  UA with large Hgb.  Lactic acid 3.5> 4.0.  Cultures obtained.  Started on ceftriaxone.  Neurology consulted.  Patient neuro symptoms resolved.  MRI brain was delayed due to his recent PPM placement.  Urine culture with GNR.  AKI, leukocytosis and lactic acidosis resolving.  Subjective: Seen and examined earlier this morning.  No major events overnight of this morning.  No complaints other than urinary urgency.  He is eager to go home but understands the need to stay in the hospital for MRI brain and final result on urine culture.  Denies chest pain, dyspnea, GI or focal neuro symptoms.  Patient's daughter at bedside.  Objective: Vitals:   04/02/21 2100 04/02/21 2125 04/03/21 0012 04/03/21 0342  BP:  (!) 138/40  (!) 157/55  Pulse:  (!) 57 (!) 56 (!) 50  Resp:    20  Temp: 98.5 F (36.9 C) 99 F (37.2 C) 98.2 F (36.8 C) 98.9 F (37.2 C)  TempSrc: Oral Oral Oral Oral  SpO2:      Weight:  101.3 kg    Height:   (1.702 m)      Intake/Output Summary (Last 24 hours) at 04/03/2021 1144 Last data filed at 04/03/2021 0920 Gross per 24 hour  Intake 1443.16 ml  Output 350 ml  Net 1093.16 ml   Filed Weights   04/02/21 2125  Weight: 101.3 kg    Examination:  GENERAL: No apparent  distress.  Nontoxic. HEENT: MMM.  Vision and hearing grossly intact.  NECK: Supple.  No apparent JVD.  RESP: On RA.  No IWOB.  Fair aeration bilaterally. CVS: Bradycardic to 40s and 50s.  2/6 SEM all over.  Heart sounds normal.  ABD/GI/GU: BS+. Abd soft, NTND.  MSK/EXT:  Moves extremities. No apparent deformity. No edema.  SKIN: no apparent skin lesion or wound NEURO: Awake, alert and oriented appropriately. Speech clear. Cranial nerves II-XII grossly intact. Motor 5/5 in all muscle groups of UE and LE bilaterally, Normal tone. Light sensation intact in all dermatomes of upper and lower ext bilaterally. Patellar reflex symmetric.  No pronator drift.  Finger to nose intact. PSYCH: Calm. Normal affect.   Procedures:  None  Microbiology summarized: COVID-19 and influenza PCR nonreactive. Blood culture NGTD. Urine culture pending  Assessment & Plan: Stroke-like symptoms-had slurred speech and expressive aphasia that have resolved.  His neuro exam is basically normal. CTH without acute finding.  CTA head and neck negative for LVO but mild to moderate right ICA stenosis and marked stenosis of an inferior right P2 PCA branch.  Not a candidate for tPA as he was on Eliquis.  His symptoms resolved as well.  TTE with moderate to severe aortic stenosis.  Pacemaker  interrogated and no issues.  UDS negative.  A1c 6.6%.  LDL 50.  TG 253. -Neurology following. -Start normalizing blood pressure -Follow MRI brain -Continue PT/OT -Continue telemetry monitoring  -Has history of statin intolerance.  Only takes pravastatin.  He may benefit from PCSK9 inhibitors.   Gram-negative UTI: Has acute on chronic urinary urgency for 2 days.  Also significant leukocytosis and lactic acidosis.  Urine culture with GNR. -Continue IV ceftriaxone pending urine culture speciation and sensitivity.   Paroxysmal A. fib with bradycardia-CHA2DS2-VASc score 7.  On Cardizem, metoprolol and Eliquis Sinus bradycardia and patient  with high-grade AVB and PPM-had PPM on 02/20/2021.  -Pacemaker interrogated and no issues identified. -Cardiology consulted -Continue telemetry monitoring -Continue holding metoprolol given bradycardia  Moderate to severe aortic valve stenosis-noted on TTE.  There was "no evidence of aortic valve stenosis" on his prior TTE on 02/19/2021 although aortic valve area was reported to be 0.85 cm at that time. -Cardiology consulted     AKI/azotemia-unclear etiology of this.  Hemodynamically mediated?  Not on nephrotoxic meds: Improved. Recent Labs    01/21/21 0022 02/17/21 1551 02/19/21 0657 02/20/21 0548 02/21/21 0504 04/01/21 1511 04/01/21 1517 04/02/21 0834 04/03/21 0154  BUN 21 17 23  25* 22 26* 27* 26* 26*  CREATININE 1.01 1.00 1.09 0.99 0.93 2.00* 2.00* 1.39* 1.15  -Discontinue IV fluid and monitor off IV fluid -Recheck renal function in the morning -Avoid/minimize nephrotoxic meds     Controlled IDDM-2 with neuropathy and hyperglycemia: A1c 6.6%.  On 70/30-40 units in the morning and 60 units in the evening and metformin at home. Recent Labs  Lab 04/02/21 0806 04/02/21 1151 04/02/21 1719 04/02/21 2146 04/03/21 0800  GLUCAP 158* 185* 199* 204* 188*  -Continue SSI-moderate -Increase NovoLog from 4 to 6 units AC -Increase Lantus from 10 to 13 units twice daily -Continue pravastatin   OSA on CPAP -Encourage nightly CPAP   GERD -P.o. Protonix   Chronic back pain: Stable.  No red flags. -As needed Tylenol   BPH with LUTS -Consider initiating Flomax prior to discharge -Needs outpatient follow-up with urology  Hematuria: UA with large Hgb and > 50 RBC.  H&H stable. -May need repeat UA in 4 to 6 weeks, and outpatient urology follow-up.  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 -Encourage lifestyle change to lose weight  DVT prophylaxis:  SCD's Start: 04/01/21 1850 apixaban (ELIQUIS) tablet 5 mg  Code Status: Full code Family Communication: Patient and/or RN.  Updated  patient's daughter at bedside. Level of care: Telemetry Medical Status is: Observation  The patient will require care spanning > 2 midnights and should be moved to inpatient because: Hemodynamically unstable, Ongoing diagnostic testing needed not appropriate for outpatient work up, IV treatments appropriate due to intensity of illness or inability to take PO, and Inpatient level of care appropriate due to severity of illness  Dispo: The patient is from: Home              Anticipated d/c is to:  Home with home health              Patient currently is not medically stable to d/c.   Difficult to place patient No       Consultants:  Neurology Cardiology   Sch Meds:  Scheduled Meds:  apixaban  5 mg Oral BID   insulin aspart  0-15 Units Subcutaneous TID WC   insulin aspart  0-5 Units Subcutaneous QHS   insulin aspart  6 Units Subcutaneous TID WC  insulin glargine  13 Units Subcutaneous BID   multivitamin  1 tablet Oral Daily   omega-3 acid ethyl esters  1 g Oral Daily   pantoprazole  40 mg Oral Daily   pravastatin  20 mg Oral Daily   Continuous Infusions:  cefTRIAXone (ROCEPHIN)  IV Stopped (04/02/21 1929)   PRN Meds:.acetaminophen **OR** acetaminophen (TYLENOL) oral liquid 160 mg/5 mL **OR** acetaminophen, senna-docusate  Antimicrobials: Anti-infectives (From admission, onward)    Start     Dose/Rate Route Frequency Ordered Stop   04/01/21 1900  cefTRIAXone (ROCEPHIN) 2 g in sodium chloride 0.9 % 100 mL IVPB        2 g 200 mL/hr over 30 Minutes Intravenous Every 24 hours 04/01/21 1852          I have personally reviewed the following labs and images: CBC: Recent Labs  Lab 04/01/21 1511 04/01/21 1517 04/02/21 0834 04/03/21 0154  WBC 24.3*  --  16.2* 12.3*  NEUTROABS 20.2*  --  12.6*  --   HGB 11.1* 11.9* 11.1* 9.7*  HCT 35.2* 35.0* 34.9* 29.9*  MCV 83.4  --  81.9 79.9*  PLT 258  --  252 214   BMP &GFR Recent Labs  Lab 04/01/21 1511 04/01/21 1517  04/02/21 0834 04/03/21 0154  NA 135 136 135 134*  K 4.9 4.8 4.4 4.1  CL 103 105 102 105  CO2 18*  --  22 22  GLUCOSE 158* 154* 163* 178*  BUN 26* 27* 26* 26*  CREATININE 2.00* 2.00* 1.39* 1.15  CALCIUM 8.4*  --  8.5* 8.0*  MG  --   --  1.9 2.0  PHOS  --   --  3.0 2.4*   Estimated Creatinine Clearance: 60.1 mL/min (by C-G formula based on SCr of 1.15 mg/dL). Liver & Pancreas: Recent Labs  Lab 04/01/21 1511 04/02/21 0834 04/03/21 0154  AST 22  --   --   ALT 14  --   --   ALKPHOS 63  --   --   BILITOT 1.0  --   --   PROT 7.0  --   --   ALBUMIN 3.0* 2.8* 2.4*   No results for input(s): LIPASE, AMYLASE in the last 168 hours. No results for input(s): AMMONIA in the last 168 hours. Diabetic: Recent Labs    04/02/21 0420  HGBA1C 6.6*   Recent Labs  Lab 04/02/21 0806 04/02/21 1151 04/02/21 1719 04/02/21 2146 04/03/21 0800  GLUCAP 158* 185* 199* 204* 188*   Cardiac Enzymes: No results for input(s): CKTOTAL, CKMB, CKMBINDEX, TROPONINI in the last 168 hours. No results for input(s): PROBNP in the last 8760 hours. Coagulation Profile: Recent Labs  Lab 04/01/21 1511  INR 1.7*   Thyroid Function Tests: No results for input(s): TSH, T4TOTAL, FREET4, T3FREE, THYROIDAB in the last 72 hours. Lipid Profile: Recent Labs    04/02/21 0420  CHOL 118  HDL 17*  LDLCALC 50  TRIG 762*  CHOLHDL 6.9   Anemia Panel: No results for input(s): VITAMINB12, FOLATE, FERRITIN, TIBC, IRON, RETICCTPCT in the last 72 hours. Urine analysis:    Component Value Date/Time   COLORURINE YELLOW 04/02/2021 0040   APPEARANCEUR HAZY (A) 04/02/2021 0040   APPEARANCEUR Clear 01/29/2012 1600   LABSPEC 1.042 (H) 04/02/2021 0040   LABSPEC 1.010 01/29/2012 1600   PHURINE 5.0 04/02/2021 0040   GLUCOSEU NEGATIVE 04/02/2021 0040   GLUCOSEU 50 mg/dL 26/33/3545 6256   HGBUR LARGE (A) 04/02/2021 0040   BILIRUBINUR NEGATIVE 04/02/2021 0040  BILIRUBINUR Negative 01/29/2012 1600   KETONESUR 5 (A)  04/02/2021 0040   PROTEINUR 100 (A) 04/02/2021 0040   NITRITE NEGATIVE 04/02/2021 0040   LEUKOCYTESUR NEGATIVE 04/02/2021 0040   LEUKOCYTESUR Negative 01/29/2012 1600   Sepsis Labs: Invalid input(s): PROCALCITONIN, LACTICIDVEN  Microbiology: Recent Results (from the past 240 hour(s))  Resp Panel by RT-PCR (Flu A&B, Covid) Nasopharyngeal Swab     Status: None   Collection Time: 04/01/21  5:16 PM   Specimen: Nasopharyngeal Swab; Nasopharyngeal(NP) swabs in vial transport medium  Result Value Ref Range Status   SARS Coronavirus 2 by RT PCR NEGATIVE NEGATIVE Final    Comment: (NOTE) SARS-CoV-2 target nucleic acids are NOT DETECTED.  The SARS-CoV-2 RNA is generally detectable in upper respiratory specimens during the acute phase of infection. The lowest concentration of SARS-CoV-2 viral copies this assay can detect is 138 copies/mL. A negative result does not preclude SARS-Cov-2 infection and should not be used as the sole basis for treatment or other patient management decisions. A negative result may occur with  improper specimen collection/handling, submission of specimen other than nasopharyngeal swab, presence of viral mutation(s) within the areas targeted by this assay, and inadequate number of viral copies(<138 copies/mL). A negative result must be combined with clinical observations, patient history, and epidemiological information. The expected result is Negative.  Fact Sheet for Patients:  BloggerCourse.com  Fact Sheet for Healthcare Providers:  SeriousBroker.it  This test is no t yet approved or cleared by the [place] FDA and  has been authorized for detection and/or diagnosis of SARS-CoV-2 by FDA under an Emergency Use Authorization (EUA). This EUA will remain  in effect (meaning this test can be used) for the duration of the COVID-19 declaration under Section 564(b)(1) of the Act, 21 U.S.C.section  360bbb-3(b)(1), unless the authorization is terminated  or revoked sooner.       Influenza A by PCR NEGATIVE NEGATIVE Final   Influenza B by PCR NEGATIVE NEGATIVE Final    Comment: (NOTE) The Xpert Xpress SARS-CoV-2/FLU/RSV plus assay is intended as an aid in the diagnosis of influenza from Nasopharyngeal swab specimens and should not be used as a sole basis for treatment. Nasal washings and aspirates are unacceptable for Xpert Xpress SARS-CoV-2/FLU/RSV testing.  Fact Sheet for Patients: BloggerCourse.com  Fact Sheet for Healthcare Providers: SeriousBroker.it  This test is not yet approved or cleared by the [place] FDA and has been authorized for detection and/or diagnosis of SARS-CoV-2 by FDA under an Emergency Use Authorization (EUA). This EUA will remain in effect (meaning this test can be used) for the duration of the COVID-19 declaration under Section 564(b)(1) of the Act, 21 U.S.C. section 360bbb-3(b)(1), unless the authorization is terminated or revoked.  Performed at [facility], [address], [place], [place] [address]   Blood culture (routine x 2)     Status: None (Preliminary result)   Collection Time: 04/01/21  6:40 PM   Specimen: BLOOD  Result Value Ref Range Status   Specimen Description BLOOD SITE NOT SPECIFIED  Final   Special Requests   Final    BOTTLES DRAWN AEROBIC AND ANAEROBIC Blood Culture results may not be optimal due to an inadequate volume of blood received in culture bottles   Culture   Final    NO GROWTH 2 DAYS Performed at [facility], [address], [place], [place] [address]    Report Status PENDING  Incomplete  Blood culture (routine x 2)     Status:  None (Preliminary result)   Collection Time: 04/01/21  6:48 PM   Specimen: BLOOD  Result Value Ref Range Status   Specimen Description BLOOD SITE NOT SPECIFIED  Final   Special Requests   Final    BOTTLES DRAWN AEROBIC  AND ANAEROBIC Blood Culture results may not be optimal due to an inadequate volume of blood received in culture bottles   Culture   Final    NO GROWTH 2 DAYS Performed at [facility], [address] [place], [place] [address]    Report Status PENDING  Incomplete  Urine culture     Status: Abnormal (Preliminary result)   Collection Time: 04/02/21 12:42 AM   Specimen: Urine, Random  Result Value Ref Range Status   Specimen Description URINE, RANDOM  Final   Special Requests   Final    NONE Performed at [facility], [address], [place], [place] [address]    Culture >=100,000 COLONIES/mL KLEBSIELLA OXYTOCA (A)  Final   Report Status PENDING  Incomplete    Radiology Studies: ECHOCARDIOGRAM COMPLETE  Result Date: 04/02/2021    ECHOCARDIOGRAM REPORT   Patient Name:   OSBORNE SERIO Date of Exam: 04/02/2021 Medical Rec #:  [id number]         Height:       67.0 in Accession #:    [id number]        Weight:       223.1 lb Date of Birth:  28-Feb-1941          BSA:          2.118 m Patient Age:    80 years          BP:           102/42 mmHg Patient Gender: M                 HR:           49 bpm. Exam Location:  Inpatient Procedure: 2D Echo, Cardiac Doppler and Color Doppler Indications:    TIA  History:        Patient has prior history of Echocardiogram examinations, most                 recent 02/21/2021. CAD; Risk Factors:Hypertension and Diabetes.  Sonographer:    Shirlean Kelly Referring Phys: [id number] Almon Hercules  Sonographer Comments: Image acquisition challenging due to patient body habitus. IMPRESSIONS  1. Left ventricular ejection fraction, by estimation, is 60 to 65%. The left ventricle has normal function. The left ventricle has no regional wall motion abnormalities. Left ventricular diastolic function could not be evaluated.  2. Right ventricular systolic function is normal. The right ventricular size is normal. Tricuspid regurgitation signal is inadequate for assessing PA  pressure.  3. Right atrial size was mildly dilated.  4. The mitral valve is normal in structure. Mild mitral valve regurgitation. No evidence of mitral stenosis.  5. The aortic valve is calcified. There is severe calcifcation of the aortic valve. There is severe thickening of the aortic valve. Aortic valve regurgitation is not visualized. Moderate to severe aortic valve stenosis.Aortic valve mean gradient measures 30.0 mmHg. Aortic valve peak gradient measures 56.6 mmHg. Aortic valve area, by VTI measures 0.97 cm.  6. The inferior vena cava is dilated in size with >50% respiratory variability, suggesting right atrial pressure of 8 mmHg. FINDINGS  Left Ventricle: Left ventricular ejection fraction, by estimation, is 60 to 65%. The left ventricle  has normal function. The left ventricle has no regional wall motion abnormalities. The left ventricular internal cavity size was normal in size. There is  no left ventricular hypertrophy. Left ventricular diastolic function could not be evaluated due to atrial fibrillation. Left ventricular diastolic function could not be evaluated. Right Ventricle: The right ventricular size is normal. No increase in right ventricular wall thickness. Right ventricular systolic function is normal. Tricuspid regurgitation signal is inadequate for assessing PA pressure. Left Atrium: Left atrial size was normal in size. Right Atrium: Right atrial size was mildly dilated. Pericardium: There is no evidence of pericardial effusion. Mitral Valve: The mitral valve is normal in structure. Mild to moderate mitral annular calcification. Mild mitral valve regurgitation. No evidence of mitral valve stenosis. Tricuspid Valve: The tricuspid valve is normal in structure. Tricuspid valve regurgitation is mild . No evidence of tricuspid stenosis. Aortic Valve: The aortic valve is calcified. There is severe calcifcation of the aortic valve. There is severe thickening of the aortic valve. Aortic valve  regurgitation is not visualized. Moderate to severe aortic stenosis is present. Aortic valve mean gradient measures 30.0 mmHg. Aortic valve peak gradient measures 56.6 mmHg. Aortic valve area, by VTI measures 0.97 cm. Pulmonic Valve: The pulmonic valve was normal in structure. Pulmonic valve regurgitation is trivial. No evidence of pulmonic stenosis. Aorta: The aortic root is normal in size and structure. Venous: The inferior vena cava is dilated in size with greater than 50% respiratory variability, suggesting right atrial pressure of 8 mmHg. IAS/Shunts: No atrial level shunt detected by color flow Doppler.  LEFT VENTRICLE PLAX 2D LVIDd:         5.40 cm LVIDs:         3.60 cm LV PW:         1.00 cm LV IVS:        1.40 cm LVOT diam:     2.20 cm LV SV:         85 LV SV Index:   40 LVOT Area:     3.80 cm  RIGHT VENTRICLE             IVC RV Basal diam:  3.70 cm     IVC diam: 2.30 cm RV S prime:     16.20 cm/s TAPSE (M-mode): 2.9 cm LEFT ATRIUM           Index       RIGHT ATRIUM           Index LA diam:      4.10 cm 1.94 cm/m  RA Area:     24.50 cm LA Vol (A2C): 61.5 ml 29.03 ml/m RA Volume:   73.60 ml  34.74 ml/m LA Vol (A4C): 73.9 ml 34.88 ml/m  AORTIC VALVE AV Area (Vmax):    0.92 cm AV Area (Vmean):   0.90 cm AV Area (VTI):     0.97 cm AV Vmax:           376.00 cm/s AV Vmean:          248.000 cm/s AV VTI:            0.879 m AV Peak Grad:      56.6 mmHg AV Mean Grad:      30.0 mmHg LVOT Vmax:         91.10 cm/s LVOT Vmean:        59.000 cm/s LVOT VTI:          0.224 m LVOT/AV VTI ratio: 0.25  AORTA Ao Root diam: 3.50 cm Ao Asc diam:  3.00 cm  SHUNTS Systemic VTI:  0.22 m Systemic Diam: 2.20 cm Armanda Magic MD Electronically signed by Armanda Magic MD Signature Date/Time: 04/02/2021/2:01:56 PM    Final        Tanazia Achee T. Daneka Lantigua [facility]  If 7PM-7AM, please contact night-coverage [email or web address] 04/03/2021, 11:44 AM

## 2021-04-03 NOTE — Plan of Care (Signed)
  Problem: Coping: Goal: Level of anxiety will decrease Outcome: Progressing   Problem: Pain Managment: Goal: General experience of comfort will improve Outcome: Progressing   Problem: Safety: Goal: Ability to remain free from injury will improve Outcome: Progressing   Problem: Skin Integrity: Goal: Risk for impaired skin integrity will decrease Outcome: Progressing   

## 2021-04-03 NOTE — Progress Notes (Signed)
STROKE TEAM PROGRESS NOTE   SUBJECTIVE (INTERVAL HISTORY) His wife is at the bedside.  Patient at baseline, no neurodeficit.  MRI showed no acute infarct.  Urine culture showed UTI with >=100,000 COLONIES/mL KLEBSIELLA OXYTOCA Abnormal, on Rocephin   OBJECTIVE Temp:  [98.1 F (36.7 C)-99 F (37.2 C)] 98.1 F (36.7 C) (07/13 1215) Pulse Rate:  [44-57] 44 (07/13 1215) Cardiac Rhythm: Heart block (07/13 0700) Resp:  [15-30] 15 (07/13 1217) BP: (126-162)/(39-55) 162/39 (07/13 1215) SpO2:  [93 %-94 %] 94 % (07/13 1215) Weight:  [101.3 kg] 101.3 kg (07/12 2125)  Recent Labs  Lab 04/02/21 1719 04/02/21 2146 04/03/21 0800 04/03/21 1226 04/03/21 1656  GLUCAP 199* 204* 188* 193* 173*   Recent Labs  Lab 04/01/21 1511 04/01/21 1517 04/02/21 0834 04/03/21 0154  NA 135 136 135 134*  K 4.9 4.8 4.4 4.1  CL 103 105 102 105  CO2 18*  --  22 22  GLUCOSE 158* 154* 163* 178*  BUN 26* 27* 26* 26*  CREATININE 2.00* 2.00* 1.39* 1.15  CALCIUM 8.4*  --  8.5* 8.0*  MG  --   --  1.9 2.0  PHOS  --   --  3.0 2.4*   Recent Labs  Lab 04/01/21 1511 04/02/21 0834 04/03/21 0154  AST 22  --   --   ALT 14  --   --   ALKPHOS 63  --   --   BILITOT 1.0  --   --   PROT 7.0  --   --   ALBUMIN 3.0* 2.8* 2.4*   Recent Labs  Lab 04/01/21 1511 04/01/21 1517 04/02/21 0834 04/03/21 0154  WBC 24.3*  --  16.2* 12.3*  NEUTROABS 20.2*  --  12.6*  --   HGB 11.1* 11.9* 11.1* 9.7*  HCT 35.2* 35.0* 34.9* 29.9*  MCV 83.4  --  81.9 79.9*  PLT 258  --  252 214   No results for input(s): CKTOTAL, CKMB, CKMBINDEX, TROPONINI in the last 168 hours. Recent Labs    04/01/21 1511  LABPROT 20.4*  INR 1.7*   Recent Labs    04/02/21 0040  COLORURINE YELLOW  LABSPEC 1.042*  PHURINE 5.0  GLUCOSEU NEGATIVE  HGBUR LARGE*  BILIRUBINUR NEGATIVE  KETONESUR 5*  PROTEINUR 100*  NITRITE NEGATIVE  LEUKOCYTESUR NEGATIVE       Component Value Date/Time   CHOL 118 04/02/2021 0420   TRIG 253 (H) 04/02/2021  0420   HDL 17 (L) 04/02/2021 0420   CHOLHDL 6.9 04/02/2021 0420   VLDL 51 (H) 04/02/2021 0420   LDLCALC 50 04/02/2021 0420   Lab Results  Component Value Date   HGBA1C 6.6 (H) 04/02/2021      Component Value Date/Time   LABOPIA NONE DETECTED 04/02/2021 0038   COCAINSCRNUR NONE DETECTED 04/02/2021 0038   LABBENZ NONE DETECTED 04/02/2021 0038   AMPHETMU NONE DETECTED 04/02/2021 0038   THCU NONE DETECTED 04/02/2021 0038   LABBARB NONE DETECTED 04/02/2021 0038    Recent Labs  Lab 04/01/21 1511  ETH <10    I have personally reviewed the radiological images below and agree with the radiology interpretations.  MR BRAIN WO CONTRAST  Result Date: 04/03/2021 CLINICAL DATA:  Neuro deficit, acute, stroke suspected. EXAM: MRI HEAD WITHOUT CONTRAST TECHNIQUE: Multiplanar, multiecho pulse sequences of the brain and surrounding structures were obtained without intravenous contrast. COMPARISON:  CT angiogram head/neck and non-contrast CT head 04/01/2021. MRI/MRA head 02/17/2021. FINDINGS: Brain: Moderate generalized cerebral atrophy. Commensurate prominence of the ventricles  and sulci. Comparatively mild cerebellar atrophy. 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 Moderate patchy and confluent T2/FLAIR hyperintensity within the cerebral white matter, nonspecific but compatible chronic small vessel ischemic disease. 2 unchanged punctate chronic microhemorrhages within the left occipital lobe (series 14, image 23). There is no acute infarct. No evidence of an intracranial mass. No extra-axial fluid collection. No midline shift. Vascular: Expected proximal arterial flow voids. Skull and upper cervical spine: No focal marrow lesion. Incompletely assessed cervical spondylosis. Sinuses/Orbits: Visualized orbits show no acute finding. Bilateral lens replacements. Trace bilateral ethmoid sinus mucosal thickening. IMPRESSION: No evidence of acute intracranial abnormality Stable non-contrast  MRI appearance of the brain as compared to 02/17/2021. Moderate chronic small vessel ischemic changes within the cerebral white matter. 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 Moderate generalized cerebral atrophy. Comparatively mild cerebellar atrophy. Electronically Signed   By: Jackey LogeKyle  Golden DO   On: 04/03/2021 12:03   ECHOCARDIOGRAM COMPLETE  Result Date: 04/02/2021    ECHOCARDIOGRAM REPORT   Patient Name:   Harry [name] Date of Exam: 04/02/2021 Medical Rec #:  [id number]         Height:       67.0 in Accession #:    [id number]        Weight:       223.1 lb Date of Birth:  05/15/1941          BSA:          2.118 m Patient Age:    80 years          BP:           102/42 mmHg Patient Gender: M                 HR:           49 bpm. Exam Location:  Inpatient Procedure: 2D Echo, Cardiac Doppler and Color Doppler Indications:    TIA  History:        Patient has prior history of Echocardiogram examinations, most                 recent 02/21/2021. CAD; Risk Factors:Hypertension and Diabetes.  Sonographer:    Shirlean KellyJohn Mendel Brown Referring Phys: [id number] Almon HerculesAYE T GONFA  Sonographer Comments: Image acquisition challenging due to patient body habitus. IMPRESSIONS  1. Left ventricular ejection fraction, by estimation, is 60 to 65%. The left ventricle has normal function. The left ventricle has no regional wall motion abnormalities. Left ventricular diastolic function could not be evaluated.  2. Right ventricular systolic function is normal. The right ventricular size is normal. Tricuspid regurgitation signal is inadequate for assessing PA pressure.  3. Right atrial size was mildly dilated.  4. The mitral valve is normal in structure. Mild mitral valve regurgitation. No evidence of mitral stenosis.  5. The aortic valve is calcified. There is severe calcifcation of the aortic valve. There is severe thickening of the aortic valve. Aortic valve regurgitation is not visualized. 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 Aortic valve peak gradient measures 56.6 mmHg. Aortic valve area, by VTI measures 0.97 cm.  6. The inferior vena cava is dilated in size with >50% respiratory variability, suggesting right atrial pressure of 8 mmHg. FINDINGS  Left Ventricle: Left ventricular ejection fraction, by estimation, is 60 to 65%. The left ventricle has normal function. The left ventricle has no regional wall motion abnormalities. The left ventricular internal cavity size was normal in  size. There is  no left ventricular hypertrophy. Left ventricular diastolic function could not be evaluated due to atrial fibrillation. Left ventricular diastolic function could not be evaluated. Right Ventricle: The right ventricular size is normal. No increase in right ventricular wall thickness. Right ventricular systolic function is normal. Tricuspid regurgitation signal is inadequate for assessing PA pressure. Left Atrium: Left atrial size was normal in size. Right Atrium: Right atrial size was mildly dilated. Pericardium: There is no evidence of pericardial effusion. Mitral Valve: The mitral valve is normal in structure. Mild to moderate mitral annular calcification. Mild mitral valve regurgitation. No evidence of mitral valve stenosis. Tricuspid Valve: The tricuspid valve is normal in structure. Tricuspid valve regurgitation is mild . No evidence of tricuspid stenosis. Aortic Valve: The aortic valve is calcified. There is severe calcifcation of the aortic valve. There is severe thickening of the aortic valve. Aortic valve regurgitation is not visualized. Moderate to severe aortic stenosis is present. Aortic valve mean gradient measures 30.0 mmHg. Aortic valve peak gradient measures 56.6 mmHg. Aortic valve area, by VTI measures 0.97 cm. Pulmonic Valve: The pulmonic valve was normal in structure. Pulmonic valve regurgitation is trivial. No evidence of pulmonic stenosis. Aorta: The aortic root is  normal in size and structure. Venous: The inferior vena cava is dilated in size with greater than 50% respiratory variability, suggesting right atrial pressure of 8 mmHg. IAS/Shunts: No atrial level shunt detected by color flow Doppler.  LEFT VENTRICLE PLAX 2D LVIDd:         5.40 cm LVIDs:         3.60 cm LV PW:         1.00 cm LV IVS:        1.40 cm LVOT diam:     2.20 cm LV SV:         85 LV SV Index:   40 LVOT Area:     3.80 cm  RIGHT VENTRICLE             IVC RV Basal diam:  3.70 cm     IVC diam: 2.30 cm RV S prime:     16.20 cm/s TAPSE (M-mode): 2.9 cm LEFT ATRIUM           Index       RIGHT ATRIUM           Index LA diam:      4.10 cm 1.94 cm/m  RA Area:     24.50 cm LA Vol (A2C): 61.5 ml 29.03 ml/m RA Volume:   73.60 ml  34.74 ml/m LA Vol (A4C): 73.9 ml 34.88 ml/m  AORTIC VALVE AV Area (Vmax):    0.92 cm AV Area (Vmean):   0.90 cm AV Area (VTI):     0.97 cm AV Vmax:           376.00 cm/s AV Vmean:          248.000 cm/s AV VTI:            0.879 m AV Peak Grad:      56.6 mmHg AV Mean Grad:      30.0 mmHg LVOT Vmax:         91.10 cm/s LVOT Vmean:        59.000 cm/s LVOT VTI:          0.224 m LVOT/AV VTI ratio: 0.25  AORTA Ao Root diam: 3.50 cm Ao Asc diam:  3.00 cm  SHUNTS Systemic VTI:  0.22 m Systemic Diam:  2.20 cm Armanda Magic MD Electronically signed by Armanda Magic MD Signature Date/Time: 04/02/2021/2:01:56 PM    Final    CT HEAD CODE STROKE WO CONTRAST  Result Date: 04/01/2021 CLINICAL DATA:  Code stroke.  Aphasia EXAM: CT HEAD WITHOUT CONTRAST CT ANGIOGRAPHY OF THE HEAD AND NECK TECHNIQUE: Contiguous axial images were obtained from the base of the skull through the vertex without intravenous contrast. Multidetector CT imaging of the head and neck was performed using the standard protocol during bolus administration of intravenous contrast. Multiplanar CT image reconstructions and MIPs were obtained to evaluate the vascular anatomy. Carotid stenosis measurements (when applicable) are obtained  utilizing NASCET criteria, using the distal internal carotid diameter as the denominator. CONTRAST:  42mL OMNIPAQUE IOHEXOL 350 MG/ML SOLN COMPARISON:  CT head 01/21/2021, MRI head 02/17/2021, MRA head 02/17/2021 FINDINGS: CT HEAD Brain: There is no acute intracranial hemorrhage, mass effect, or edema. Gray-white differentiation is preserved. Patchy and confluent areas of hypoattenuation in the supratentorial white matter are nonspecific but probably reflect moderate chronic microvascular ischemic changes unchanged from prior. There is no extra-axial fluid collection. Ventricles and sulci are stable in size and configuration. Vascular: No hyperdense vessel. Skull: Calvarium is unremarkable. Sinuses/Orbits: No acute finding. Other: None. Review of the MIP images confirms the above findings CTA NECK Aortic arch: Mild calcified plaque along the arch. There is direct origin of the left vertebral artery from the arch. Calcified plaque is present at the origin with apparent marked stenosis (there is some streak artifact through this region) but no flow limitation. Right carotid system: Patent. Mixed plaque at the external carotid origin with high-grade stenosis. Trace calcified plaque along the ICA origin without stenosis. Left carotid system: Patent. Calcified plaque with high-grade stenosis at the external carotid origin. Trace plaque at the ICA origin without stenosis. Vertebral arteries: Patent and codominant. Skeleton: Degenerative changes of the cervical spine. Bulky anterior osteophytes. Other neck: Unremarkable. Upper chest: Included upper lungs are clear. Review of the MIP images confirms the above findings CTA HEAD Anterior circulation: Intracranial internal carotid arteries are patent with calcified plaque along cavernous and proximal supraclinoid portions causing mild to moderate stenosis, right greater than left. Anterior and middle cerebral arteries are patent. Posterior circulation: Intracranial vertebral  arteries are patent. Basilar artery is patent. Major cerebellar artery origins are patent. Posterior cerebral arteries are patent. Moderate stenosis of inferior right distal P2 PCA branch. Venous sinuses: Patent as allowed by contrast bolus timing. Review of the MIP images confirms the above findings IMPRESSION: There is no acute intracranial hemorrhage or evidence of acute infarction. ASPECT score is 10. No large vessel occlusion. Minimal plaque at the ICA origins without stenosis. 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 Marked stenosis of an inferior right P2 PCA branch. These results were communicated to Dr. Roda Shutters at 3:36 pm on 04/01/2021 by text page via the [facility] messaging system. Electronically Signed   By: Guadlupe Spanish M.D.   On: 04/01/2021 15:49   CT ANGIO HEAD NECK W WO CM (CODE STROKE)  Result Date: 04/01/2021 CLINICAL DATA:  Code stroke.  Aphasia EXAM: CT HEAD WITHOUT CONTRAST CT ANGIOGRAPHY OF THE HEAD AND NECK TECHNIQUE: Contiguous axial images were obtained from the base of the skull through the vertex without intravenous contrast. Multidetector CT imaging of the head and neck was performed using the standard protocol during bolus administration of intravenous contrast. Multiplanar CT image reconstructions and MIPs were obtained to evaluate the vascular anatomy. Carotid stenosis measurements (when applicable) are  obtained utilizing NASCET criteria, using the distal internal carotid diameter as the denominator. CONTRAST:  66mL OMNIPAQUE IOHEXOL 350 MG/ML SOLN COMPARISON:  CT head 01/21/2021, MRI head 02/17/2021, MRA head 02/17/2021 FINDINGS: CT HEAD Brain: There is no acute intracranial hemorrhage, mass effect, or edema. Gray-white differentiation is preserved. Patchy and confluent areas of hypoattenuation in the supratentorial white matter are nonspecific but probably reflect moderate chronic microvascular ischemic changes unchanged from prior. There is no extra-axial fluid  collection. Ventricles and sulci are stable in size and configuration. Vascular: No hyperdense vessel. Skull: Calvarium is unremarkable. Sinuses/Orbits: No acute finding. Other: None. Review of the MIP images confirms the above findings CTA NECK Aortic arch: Mild calcified plaque along the arch. There is direct origin of the left vertebral artery from the arch. Calcified plaque is present at the origin with apparent marked stenosis (there is some streak artifact through this region) but no flow limitation. Right carotid system: Patent. Mixed plaque at the external carotid origin with high-grade stenosis. Trace calcified plaque along the ICA origin without stenosis. Left carotid system: Patent. Calcified plaque with high-grade stenosis at the external carotid origin. Trace plaque at the ICA origin without stenosis. Vertebral arteries: Patent and codominant. Skeleton: Degenerative changes of the cervical spine. Bulky anterior osteophytes. Other neck: Unremarkable. Upper chest: Included upper lungs are clear. Review of the MIP images confirms the above findings CTA HEAD Anterior circulation: Intracranial internal carotid arteries are patent with calcified plaque along cavernous and proximal supraclinoid portions causing mild to moderate stenosis, right greater than left. Anterior and middle cerebral arteries are patent. Posterior circulation: Intracranial vertebral arteries are patent. Basilar artery is patent. Major cerebellar artery origins are patent. Posterior cerebral arteries are patent. Moderate stenosis of inferior right distal P2 PCA branch. Venous sinuses: Patent as allowed by contrast bolus timing. Review of the MIP images confirms the above findings IMPRESSION: There is no acute intracranial hemorrhage or evidence of acute infarction. ASPECT score is 10. No large vessel occlusion. Minimal plaque at the ICA origins without stenosis. 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 Marked  stenosis of an inferior right P2 PCA branch. These results were communicated to Dr. Roda Shutters at 3:36 pm on 04/01/2021 by text page via the [facility] messaging system. Electronically Signed   By: Guadlupe Spanish M.D.   On: 04/01/2021 15:49      PHYSICAL EXAM  Temp:  [98.1 F (36.7 C)-99 F (37.2 C)] 98.1 F (36.7 C) (07/13 1215) Pulse Rate:  [44-57] 44 (07/13 1215) Resp:  [15-30] 15 (07/13 1217) BP: (126-162)/(39-55) 162/39 (07/13 1215) SpO2:  [93 %-94 %] 94 % (07/13 1215) Weight:  [101.3 kg] 101.3 kg (07/12 2125)  General - Well nourished, well developed, in no apparent distress.  Ophthalmologic - fundi not visualized due to noncooperation.  Cardiovascular - irregularly irregular heart rate and rhythm.  Mental Status -  Level of arousal and orientation to time, place, and person were intact. Language including expression, naming, repetition, comprehension was assessed and found intact. Fund of Knowledge was assessed and was intact.  Cranial Nerves II - XII - II - Visual field intact OU. III, IV, VI - Extraocular movements intact. V - Facial sensation intact bilaterally. VII - Facial movement intact bilaterally. VIII - Hearing & vestibular intact bilaterally. X - Palate elevates symmetrically. XI - Chin turning & shoulder shrug intact bilaterally. XII - Tongue protrusion intact.  Motor Strength - The patient's strength was normal in all extremities and pronator drift was absent.  Bulk was normal and fasciculations were absent.   Motor Tone - Muscle tone was assessed at the neck and appendages and was normal.  Reflexes - The patient's reflexes were symmetrical in all extremities and he had no pathological reflexes.  Sensory - Light touch, temperature/pinprick were assessed and were symmetrical.    Coordination - The patient had normal movements in the hands and feet with no ataxia or dysmetria.  Tremor was absent.  Gait and Station - deferred.   ASSESSMENT/PLAN Harry [name] is a 80 y.o. male with history of hypertension, hyperlipidemia, OSA, A. fib on Eliquis, CAD, diabetes, SSS with recent pacemaker placement admitted for slurred speech, word finding difficulty. No tPA given due to on Eliquis.    TIA versus encephalopathy due to UTI CT no acute abnormality CTA head and neck no LVO, marked stenosis right P2 branch.  Mild to moderate ntracranial ICA atherosclerosis. MRI no acute infarct 2D Echo EF 60 to 65% LDL 50 HgbA1c 6.6 Eliquis for VTE prophylaxis Eliquis (apixaban) twice daily prior to admission, now on Eliquis (apixaban) twice daily.  Patient counseled to be compliant with his antithrombotic medications Ongoing aggressive stroke risk factor management Therapy recommendations: Pending Disposition: Pending  Chronic A. Fib On Eliquis PTA Rate controlled Continue Eliquis  SSS status post pacemaker Leadless pacemaker placed 02/20/2021 by Dr. Marge Duncans at [facility] to have MRI tomorrow  UTI UA WBC 0-5, hazy, RBC > 50 Urine culture >=100,000 COLONIES/mL KLEBSIELLA OXYTOCA Abnormal, On Rocephin Management per primary team  Diabetes HgbA1c 6.6 goal < 7.0 Controlled Currently on Lantus CBG monitoring SSI DM education and close PCP follow up  Hypertension Stable Long term BP goal normotensive  Hyperlipidemia Home meds: Pravastatin 20 LDL 50, goal < 70 Now on pravastatin 20 Continue statin at discharge  Other Stroke Risk Factors Advanced age Obesity, calculate BMI 34.94 Obstructive sleep apnea, on CPAP at home  Other Active Problems Cough, on Rocephin  Hospital day # 0  Neurology will sign off. Please call with questions. Pt will follow up with stroke clinic NP at [facility] in about 4 weeks. Thanks for the consult.   Marvel Plan, MD PhD Stroke Neurology 04/03/2021 6:09 PM    To contact Stroke Continuity provider, please refer to [email or web address]. After hours, contact General Neurology

## 2021-04-04 DIAGNOSIS — N3001 Acute cystitis with hematuria: Secondary | ICD-10-CM

## 2021-04-04 LAB — RENAL FUNCTION PANEL
Albumin: 2.4 g/dL — ABNORMAL LOW (ref 3.5–5.0)
Anion gap: 5 (ref 5–15)
BUN: 23 mg/dL (ref 8–23)
CO2: 26 mmol/L (ref 22–32)
Calcium: 8.2 mg/dL — ABNORMAL LOW (ref 8.9–10.3)
Chloride: 106 mmol/L (ref 98–111)
Creatinine, Ser: 1.1 mg/dL (ref 0.61–1.24)
GFR, Estimated: >60 mL/min (ref >60)
Glucose, Bld: 141 mg/dL — ABNORMAL HIGH (ref 70–99)
Phosphorus: 2.8 mg/dL (ref 2.5–4.6)
Potassium: 4.4 mmol/L (ref 3.5–5.1)
Sodium: 137 mmol/L (ref 135–145)

## 2021-04-04 LAB — URINE CULTURE: Culture: >=100000 — AB

## 2021-04-04 LAB — CBC
HCT: 30.2 % — ABNORMAL LOW (ref 39.0–52.0)
Hemoglobin: 9.6 g/dL — ABNORMAL LOW (ref 13.0–17.0)
MCH: 25.6 pg — ABNORMAL LOW (ref 26.0–34.0)
MCHC: 31.8 g/dL (ref 30.0–36.0)
MCV: 80.5 fL (ref 80.0–100.0)
Platelets: 235 10*3/uL (ref 150–400)
RBC: 3.75 MIL/uL — ABNORMAL LOW (ref 4.22–5.81)
RDW: 17.1 % — ABNORMAL HIGH (ref 11.5–15.5)
WBC: 10.8 10*3/uL — ABNORMAL HIGH (ref 4.0–10.5)
nRBC: 0 % (ref 0.0–0.2)

## 2021-04-04 LAB — MAGNESIUM: Magnesium: 2.1 mg/dL (ref 1.7–2.4)

## 2021-04-04 LAB — GLUCOSE, CAPILLARY: Glucose-Capillary: 164 mg/dL — ABNORMAL HIGH (ref 70–99)

## 2021-04-04 MED ORDER — CEFDINIR 300 MG PO CAPS: 300.0000 mg | ORAL_CAPSULE | Freq: Two times a day (BID) | ORAL | 0 refills | Status: AC

## 2021-04-04 NOTE — Discharge Summary (Signed)
Physician Discharge Summary  Harry [name] ZDG:[id number] DOB: 01-11-1941 DOA: 04/01/2021  PCP: Kandyce Rud, MD  Admit date: 04/01/2021 Discharge date: 04/04/2021  Admitted From: Home Disposition: Home  Recommendations for Outpatient Follow-up:  Outpatient follow-up with PCP, neurology and cardiology Please obtain CBC/BMP/Mag at follow up Please follow up on the following pending results: None  Home Health: PT/OT Equipment/Devices: 3 in 1 commode  Discharge Condition: Stable CODE STATUS: Full code   Follow-up Information     [facility]. Schedule an appointment as soon as possible for a visit in 1 month(s).   Specialty: Neurology Why: stroke clinic Contact information: [address] [place] [address] [phone]        Kandyce Rud, MD. Schedule an appointment as soon as possible for a visit in 1 week(s).   Specialty: Family Medicine Why: If symptoms worsen, As needed Contact information: [address] [facility] [place] [address] [phone]         [facility].   Specialty: Home Health Services Why: [facility] will contact you to schedule your first home visit. Contact information: [address] [place] [address] [phone]                   Hospital Course: 80 y.o. 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  CTH without acute finding.  CTA head and neck negative for LVO but mild to moderate right ICA stenosis and marked stenosis of an inferior right P2 PCA branch.  UA with large Hgb.  Lactic acid 3.5> 4.0.  Cultures obtained.  Started on ceftriaxone.  Neurology consulted.   Patient neuro  symptoms resolved.  MRI brain without acute finding. TTE with moderate to severe aortic stenosis.  Pacemaker interrogated and no issues.  UDS negative.  A1c 6.6%.  LDL 50.  TG 253.  Cleared for discharge by neurology.   AKI, leukocytosis and lactic acidosis resolved. Urine culture with Klebsiella oxytoca.  Discharged on p.o. cefdinir for 4 more days.  Patient needs outpatient follow-up with urology for hematuria and LUTS.  See individual problem list below for more on hospital course.   Discharge Diagnoses:  Stroke-like symptoms-had slurred speech and expressive aphasia that have resolved.  His neuro exam is basically normal. CTH without acute finding.  CTA head and neck negative for LVO but mild to moderate right ICA stenosis and marked stenosis of an inferior right P2 PCA branch.  Not a candidate for tPA as he was on Eliquis.  His symptoms resolved as well.  MRI brain without acute finding.  TTE with moderate to severe aortic stenosis.  Pacemaker interrogated and no issues.  UDS negative.  A1c 6.6%.  LDL 50.  TG 253. -Has history of statin intolerance.  Only takes pravastatin.  He may benefit from PCSK9 inhibitors. -Continue Eliquis   Urinary tract infection with hematuria: Urine culture with Klebsiella oxytoca.   LUTS-acute on chronic urgency -Received IV ceftriaxone for 3 days and discharged on p.o. cefdinir for 4 more days. -Ambulatory referral to urology ordered.   Paroxysmal A. fib with bradycardia-CHA2DS2-VASc score 7.  On metoprolol and Eliquis Sinus bradycardia and patient with high-grade AVB and  PPM-had PPM on 02/20/2021. -Pacemaker interrogated and no issues identified. -Metoprolol discontinued in the setting of bradycardia   Moderate to severe aortic valve stenosis-noted on TTE.  There was "no evidence of aortic valve stenosis" on his prior TTE on 02/19/2021 although aortic valve area was reported to be 0.85 cm at that time. -Outpatient follow-up with cardiology      AKI/azotemia-likely hemodynamically mediated.  Resolved. -Recheck renal function in 1 to 2 weeks     Controlled IDDM-2 with neuropathy and hyperglycemia: A1c 6.6%.   -Continue home regimen -Continue pravastatin   OSA on CPAP -Encourage nightly CPAP   GERD -P.o. Protonix   Chronic back pain: Stable.  No red flags. -As needed Tylenol   BPH with LUTS/hematuria: H&H stable. 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 Body mass index is 34.98 kg/m.  -Encourage lifestyle change to lose weight       Pressure Injury 02/18/21 Sacrum Mid;Bilateral Stage 1 -  Intact skin with non-blanchable redness of a localized area usually over a bony prominence. red and boggy (Active)  02/18/21 1100  Location: Sacrum  Location Orientation: Mid;Bilateral  Staging: Stage 1 -  Intact skin with non-blanchable redness of a localized area usually over a bony prominence.  Wound Description (Comments): red and boggy  Present on Admission: Yes    Discharge Exam: Vitals:   04/03/21 1955 04/04/21 0806  BP: (!) 123/48 (!) 171/64  Pulse: (!) 49 75  Resp: 19 (!) 23  Temp: 98.9 F (37.2 C) 98.1 F (36.7 C)  SpO2: 96% 98%    GENERAL: No apparent distress.  Nontoxic. HEENT: MMM.  Vision and hearing grossly intact.  NECK: Supple.  No apparent JVD.  RESP: On RA.  No IWOB.  Fair aeration bilaterally. CVS:  RRR. Heart sounds normal.  ABD/GI/GU: Bowel sounds present. Soft. Non tender.  MSK/EXT:  Moves extremities. No apparent deformity. No edema.  SKIN: no apparent skin lesion or wound NEURO: Awake, alert and oriented appropriately.  No apparent focal neuro deficit. PSYCH: Calm. Normal affect.   Discharge Instructions  Discharge Instructions     Ambulatory referral to Urology   Complete by: As directed    Call MD for:  difficulty breathing, headache or visual disturbances   Complete by: As directed    Call MD for:  extreme fatigue    Complete by: As directed    Call MD for:  persistant dizziness or light-headedness   Complete by: As directed    Call MD for:  temperature >100.4   Complete by: As directed    Diet - low sodium heart healthy   Complete by: As directed    Diet Carb Modified   Complete by: As directed    Discharge instructions   Complete by: As directed    It has been a pleasure taking care of you!  You were hospitalized with strokelike symptoms, acute kidney injury and urinary tract infection.  The MRI of your brain did not show stroke.  Your symptoms are likely due to acute kidney injury and infection.  Your kidney injury resolved with intravenous hydration.  We have started you on antibiotic for urinary tract infection, and and we are discharging you more antibiotics to complete treatment course.  We have ordered referral to urology in [facility] for further evaluation about urinary symptoms and possible enlarged prostate.  Please review your new medication list and the directions on your medications before you  take them.  Follow-up with your primary care doctor in 1 to 2 weeks, and your cardiologist as previously planned   Take care,   Increase activity slowly   Complete by: As directed       Allergies as of 04/04/2021       Reactions   Atorvastatin    Other reaction(s): Muscle Pain   Bupropion Other (See Comments)   Other reaction(s): Dizziness CNS side effects   Citalopram    Other reaction(s): Other (See Comments) GI side effects   Pioglitazone    Other reaction(s): Muscle Pain        Medication List     STOP taking these medications    diphenhydramine-acetaminophen 25-500 MG Tabs tablet Commonly known as: TYLENOL PM   metoprolol succinate 50 MG 24 hr tablet Commonly known as: TOPROL-XL   traMADol 50 MG tablet Commonly known as: ULTRAM       TAKE these medications    ASPIRIN 81 PO Take 81 mg by mouth daily.   cefdinir 300 MG capsule Commonly known as:  OMNICEF Take 1 capsule (300 mg total) by mouth 2 (two) times daily for 4 days.   Eliquis 5 MG Tabs tablet Generic drug: apixaban Take 1 tablet (5 mg total) by mouth 2 (two) times daily.   insulin NPH-regular Human (70-30) 100 UNIT/ML injection Inject 40-60 Units into the skin See admin instructions. Inject 40 units in the morning and 60 units in the evening.   metFORMIN 1000 MG tablet Commonly known as: GLUCOPHAGE Take 1,000 mg by mouth 2 (two) times daily with a meal.   nitroGLYCERIN 0.4 MG SL tablet Commonly known as: NITROSTAT Place 0.4 mg under the tongue every 5 (five) minutes as needed for chest pain.   Omega-3 1000 MG Caps Take 1,000 mg by mouth every evening.   pravastatin 20 MG tablet Commonly known as: PRAVACHOL Take 20 mg by mouth daily.   PRESERVISION/LUTEIN PO Take 1 capsule by mouth daily.   Trulicity 1.5 MG/0.5ML Sopn Generic drug: Dulaglutide Inject 1.5 mg into the skin See admin instructions. Every 10 days        Consultations: Cardiology Neurology  Procedures/Studies:   MR BRAIN WO CONTRAST  Result Date: 04/03/2021 CLINICAL DATA:  Neuro deficit, acute, stroke suspected. EXAM: MRI HEAD WITHOUT CONTRAST TECHNIQUE: Multiplanar, multiecho pulse sequences of the brain and surrounding structures were obtained without intravenous contrast. COMPARISON:  CT angiogram head/neck and non-contrast CT head 04/01/2021. MRI/MRA head 02/17/2021. FINDINGS: Brain: Moderate generalized cerebral atrophy. Commensurate prominence of the ventricles and sulci. Comparatively mild cerebellar atrophy. 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 Moderate patchy and confluent T2/FLAIR hyperintensity within the cerebral white matter, nonspecific but compatible chronic small vessel ischemic disease. 2 unchanged punctate chronic microhemorrhages within the left occipital lobe (series 14, image 23). There is no acute infarct. No evidence of an intracranial mass. No  extra-axial fluid collection. No midline shift. Vascular: Expected proximal arterial flow voids. Skull and upper cervical spine: No focal marrow lesion. Incompletely assessed cervical spondylosis. Sinuses/Orbits: Visualized orbits show no acute finding. Bilateral lens replacements. Trace bilateral ethmoid sinus mucosal thickening. IMPRESSION: No evidence of acute intracranial abnormality Stable non-contrast MRI appearance of the brain as compared to 02/17/2021. Moderate chronic small vessel ischemic changes within the cerebral white matter. 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 Moderate generalized cerebral atrophy. Comparatively mild cerebellar atrophy. Electronically Signed   By: Jackey Loge DO   On: 04/03/2021 12:03   ECHOCARDIOGRAM COMPLETE  Result Date: 04/02/2021    ECHOCARDIOGRAM REPORT   Patient Name:   Lucious GrovesRICHARD A Das Date of Exam: 04/02/2021 Medical Rec #:  [id number]         Height:       67.0 in Accession #:    [id number]        Weight:       223.1 lb Date of Birth:  03/18/1941          BSA:          2.118 m Patient Age:    [age] years          BP:           102/42 mmHg Patient Gender: M                 HR:           49 bpm. Exam Location:  Inpatient Procedure: 2D Echo, Cardiac Doppler and Color Doppler Indications:    TIA  History:        Patient has prior history of Echocardiogram examinations, most                 recent 02/21/2021. CAD; Risk Factors:Hypertension and Diabetes.  Sonographer:    Shirlean KellyJohn Mendel Brown Referring Phys: [id number] Almon HerculesAYE T Dannel Rafter  Sonographer Comments: Image acquisition challenging due to patient body habitus. IMPRESSIONS  1. Left ventricular ejection fraction, by estimation, is 60 to 65%. The left ventricle has normal function. The left ventricle has no regional wall motion abnormalities. Left ventricular diastolic function could not be evaluated.  2. Right ventricular systolic function is normal. The right ventricular size is normal. Tricuspid regurgitation  signal is inadequate for assessing PA pressure.  3. Right atrial size was mildly dilated.  4. The mitral valve is normal in structure. Mild mitral valve regurgitation. No evidence of mitral stenosis.  5. The aortic valve is calcified. There is severe calcifcation of the aortic valve. There is severe thickening of the aortic valve. Aortic valve regurgitation is not visualized. Moderate to severe aortic valve stenosis.Aortic valve mean gradient measures 30.0 mmHg. Aortic valve peak gradient measures 56.6 mmHg. Aortic valve area, by VTI measures 0.97 cm.  6. The inferior vena cava is dilated in size with >50% respiratory variability, suggesting right atrial pressure of 8 mmHg. FINDINGS  Left Ventricle: Left ventricular ejection fraction, by estimation, is 60 to 65%. The left ventricle has normal function. The left ventricle has no regional wall motion abnormalities. The left ventricular internal cavity size was normal in size. There is  no left ventricular hypertrophy. Left ventricular diastolic function could not be evaluated due to atrial fibrillation. Left ventricular diastolic function could not be evaluated. Right Ventricle: The right ventricular size is normal. No increase in right ventricular wall thickness. Right ventricular systolic function is normal. Tricuspid regurgitation signal is inadequate for assessing PA pressure. Left Atrium: Left atrial size was normal in size. Right Atrium: Right atrial size was mildly dilated. Pericardium: There is no evidence of pericardial effusion. Mitral Valve: The mitral valve is normal in structure. Mild to moderate mitral annular calcification. Mild mitral valve regurgitation. No evidence of mitral valve stenosis. Tricuspid Valve: The tricuspid valve is normal in structure. Tricuspid valve regurgitation is mild . No evidence of tricuspid stenosis. Aortic Valve: The aortic valve is calcified. There is severe calcifcation of the aortic valve. There is severe thickening of  the aortic valve. Aortic valve regurgitation is not visualized. Moderate to severe aortic stenosis is present.  Aortic valve mean gradient measures 30.0 mmHg. Aortic valve peak gradient measures 56.6 mmHg. Aortic valve area, by VTI measures 0.97 cm. Pulmonic Valve: The pulmonic valve was normal in structure. Pulmonic valve regurgitation is trivial. No evidence of pulmonic stenosis. Aorta: The aortic root is normal in size and structure. Venous: The inferior vena cava is dilated in size with greater than 50% respiratory variability, suggesting right atrial pressure of 8 mmHg. IAS/Shunts: No atrial level shunt detected by color flow Doppler.  LEFT VENTRICLE PLAX 2D LVIDd:         5.40 cm LVIDs:         3.60 cm LV PW:         1.00 cm LV IVS:        1.40 cm LVOT diam:     2.20 cm LV SV:         85 LV SV Index:   40 LVOT Area:     3.80 cm  RIGHT VENTRICLE             IVC RV Basal diam:  3.70 cm     IVC diam: 2.30 cm RV S prime:     16.20 cm/s TAPSE (M-mode): 2.9 cm LEFT ATRIUM           Index       RIGHT ATRIUM           Index LA diam:      4.10 cm 1.94 cm/m  RA Area:     24.50 cm LA Vol (A2C): 61.5 ml 29.03 ml/m RA Volume:   73.60 ml  34.74 ml/m LA Vol (A4C): 73.9 ml 34.88 ml/m  AORTIC VALVE AV Area (Vmax):    0.92 cm AV Area (Vmean):   0.90 cm AV Area (VTI):     0.97 cm AV Vmax:           376.00 cm/s AV Vmean:          248.000 cm/s AV VTI:            0.879 m AV Peak Grad:      56.6 mmHg AV Mean Grad:      30.0 mmHg LVOT Vmax:         91.10 cm/s LVOT Vmean:        59.000 cm/s LVOT VTI:          0.224 m LVOT/AV VTI ratio: 0.25  AORTA Ao Root diam: 3.50 cm Ao Asc diam:  3.00 cm  SHUNTS Systemic VTI:  0.22 m Systemic Diam: 2.20 cm Armanda Magic MD Electronically signed by Armanda Magic MD Signature Date/Time: 04/02/2021/2:01:56 PM    Final    CT HEAD CODE STROKE WO CONTRAST  Result Date: 04/01/2021 CLINICAL DATA:  Code stroke.  Aphasia EXAM: CT HEAD WITHOUT CONTRAST CT ANGIOGRAPHY OF THE HEAD AND NECK TECHNIQUE:  Contiguous axial images were obtained from the base of the skull through the vertex without intravenous contrast. Multidetector CT imaging of the head and neck was performed using the standard protocol during bolus administration of intravenous contrast. Multiplanar CT image reconstructions and MIPs were obtained to evaluate the vascular anatomy. Carotid stenosis measurements (when applicable) are obtained utilizing NASCET criteria, using the distal internal carotid diameter as the denominator. CONTRAST:  60mL OMNIPAQUE IOHEXOL 350 MG/ML SOLN COMPARISON:  CT head 01/21/2021, MRI head 02/17/2021, MRA head 02/17/2021 FINDINGS: CT HEAD Brain: There is no acute intracranial hemorrhage, mass effect, or edema. Gray-white differentiation is preserved. Patchy and confluent areas of hypoattenuation  in the supratentorial white matter are nonspecific but probably reflect moderate chronic microvascular ischemic changes unchanged from prior. There is no extra-axial fluid collection. Ventricles and sulci are stable in size and configuration. Vascular: No hyperdense vessel. Skull: Calvarium is unremarkable. Sinuses/Orbits: No acute finding. Other: None. Review of the MIP images confirms the above findings CTA NECK Aortic arch: Mild calcified plaque along the arch. There is direct origin of the left vertebral artery from the arch. Calcified plaque is present at the origin with apparent marked stenosis (there is some streak artifact through this region) but no flow limitation. Right carotid system: Patent. Mixed plaque at the external carotid origin with high-grade stenosis. Trace calcified plaque along the ICA origin without stenosis. Left carotid system: Patent. Calcified plaque with high-grade stenosis at the external carotid origin. Trace plaque at the ICA origin without stenosis. Vertebral arteries: Patent and codominant. Skeleton: Degenerative changes of the cervical spine. Bulky anterior osteophytes. Other neck: Unremarkable.  Upper chest: Included upper lungs are clear. Review of the MIP images confirms the above findings CTA HEAD Anterior circulation: Intracranial internal carotid arteries are patent with calcified plaque along cavernous and proximal supraclinoid portions causing mild to moderate stenosis, right greater than left. Anterior and middle cerebral arteries are patent. Posterior circulation: Intracranial vertebral arteries are patent. Basilar artery is patent. Major cerebellar artery origins are patent. Posterior cerebral arteries are patent. Moderate stenosis of inferior right distal P2 PCA branch. Venous sinuses: Patent as allowed by contrast bolus timing. Review of the MIP images confirms the above findings IMPRESSION: There is no acute intracranial hemorrhage or evidence of acute infarction. ASPECT score is 10. No large vessel occlusion. Minimal plaque at the ICA origins without stenosis. 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 Marked stenosis of an inferior right P2 PCA branch. These results were communicated to Dr. Roda Shutters at 3:36 pm on 04/01/2021 by text page via the [facility] messaging system. Electronically Signed   By: Guadlupe Spanish M.D.   On: 04/01/2021 15:49   CT ANGIO HEAD NECK W WO CM (CODE STROKE)  Result Date: 04/01/2021 CLINICAL DATA:  Code stroke.  Aphasia EXAM: CT HEAD WITHOUT CONTRAST CT ANGIOGRAPHY OF THE HEAD AND NECK TECHNIQUE: Contiguous axial images were obtained from the base of the skull through the vertex without intravenous contrast. Multidetector CT imaging of the head and neck was performed using the standard protocol during bolus administration of intravenous contrast. Multiplanar CT image reconstructions and MIPs were obtained to evaluate the vascular anatomy. Carotid stenosis measurements (when applicable) are obtained utilizing NASCET criteria, using the distal internal carotid diameter as the denominator. CONTRAST:  60mL OMNIPAQUE IOHEXOL 350 MG/ML SOLN COMPARISON:  CT  head 01/21/2021, MRI head 02/17/2021, MRA head 02/17/2021 FINDINGS: CT HEAD Brain: There is no acute intracranial hemorrhage, mass effect, or edema. Gray-white differentiation is preserved. Patchy and confluent areas of hypoattenuation in the supratentorial white matter are nonspecific but probably reflect moderate chronic microvascular ischemic changes unchanged from prior. There is no extra-axial fluid collection. Ventricles and sulci are stable in size and configuration. Vascular: No hyperdense vessel. Skull: Calvarium is unremarkable. Sinuses/Orbits: No acute finding. Other: None. Review of the MIP images confirms the above findings CTA NECK Aortic arch: Mild calcified plaque along the arch. There is direct origin of the left vertebral artery from the arch. Calcified plaque is present at the origin with apparent marked stenosis (there is some streak artifact through this region) but no flow limitation. Right carotid system: Patent. Mixed plaque at  the external carotid origin with high-grade stenosis. Trace calcified plaque along the ICA origin without stenosis. Left carotid system: Patent. Calcified plaque with high-grade stenosis at the external carotid origin. Trace plaque at the ICA origin without stenosis. Vertebral arteries: Patent and codominant. Skeleton: Degenerative changes of the cervical spine. Bulky anterior osteophytes. Other neck: Unremarkable. Upper chest: Included upper lungs are clear. Review of the MIP images confirms the above findings CTA HEAD Anterior circulation: Intracranial internal carotid arteries are patent with calcified plaque along cavernous and proximal supraclinoid portions causing mild to moderate stenosis, right greater than left. Anterior and middle cerebral arteries are patent. Posterior circulation: Intracranial vertebral arteries are patent. Basilar artery is patent. Major cerebellar artery origins are patent. Posterior cerebral arteries are patent. Moderate stenosis of  inferior right distal P2 PCA branch. Venous sinuses: Patent as allowed by contrast bolus timing. Review of the MIP images confirms the above findings IMPRESSION: There is no acute intracranial hemorrhage or evidence of acute infarction. ASPECT score is 10. No large vessel occlusion. Minimal plaque at the ICA origins without stenosis. 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 Marked stenosis of an inferior right P2 PCA branch. These results were communicated to Dr. Roda Shutters at 3:36 pm on 04/01/2021 by text page via the [facility] messaging system. Electronically Signed   By: Guadlupe Spanish M.D.   On: 04/01/2021 15:49       The results of significant diagnostics from this hospitalization (including imaging, microbiology, ancillary and laboratory) are listed below for reference.     Microbiology: Recent Results (from the past 240 hour(s))  Resp Panel by RT-PCR (Flu A&B, Covid) Nasopharyngeal Swab     Status: None   Collection Time: 04/01/21  5:16 PM   Specimen: Nasopharyngeal Swab; Nasopharyngeal(NP) swabs in vial transport medium  Result Value Ref Range Status   SARS Coronavirus 2 by RT PCR NEGATIVE NEGATIVE Final    Comment: (NOTE) SARS-CoV-2 target nucleic acids are NOT DETECTED.  The SARS-CoV-2 RNA is generally detectable in upper respiratory specimens during the acute phase of infection. The lowest concentration of SARS-CoV-2 viral copies this assay can detect is 138 copies/mL. A negative result does not preclude SARS-Cov-2 infection and should not be used as the sole basis for treatment or other patient management decisions. A negative result may occur with  improper specimen collection/handling, submission of specimen other than nasopharyngeal swab, presence of viral mutation(s) within the areas targeted by this assay, and inadequate number of viral copies(<138 copies/mL). A negative result must be combined with clinical observations, patient history, and  epidemiological information. The expected result is Negative.  Fact Sheet for Patients:  BloggerCourse.com  Fact Sheet for Healthcare Providers:  SeriousBroker.it  This test is no t yet approved or cleared by the [place] FDA and  has been authorized for detection and/or diagnosis of SARS-CoV-2 by FDA under an Emergency Use Authorization (EUA). This EUA will remain  in effect (meaning this test can be used) for the duration of the COVID-19 declaration under Section 564(b)(1) of the Act, 21 U.S.C.section 360bbb-3(b)(1), unless the authorization is terminated  or revoked sooner.       Influenza A by PCR NEGATIVE NEGATIVE Final   Influenza B by PCR NEGATIVE NEGATIVE Final    Comment: (NOTE) The Xpert Xpress SARS-CoV-2/FLU/RSV plus assay is intended as an aid in the diagnosis of influenza from Nasopharyngeal swab specimens and should not be used as a sole basis for treatment. Nasal washings and aspirates are unacceptable for Xpert  Xpress SARS-CoV-2/FLU/RSV testing.  Fact Sheet for Patients: BloggerCourse.com  Fact Sheet for Healthcare Providers: SeriousBroker.it  This test is not yet approved or cleared by the [place] FDA and has been authorized for detection and/or diagnosis of SARS-CoV-2 by FDA under an Emergency Use Authorization (EUA). This EUA will remain in effect (meaning this test can be used) for the duration of the COVID-19 declaration under Section 564(b)(1) of the Act, 21 U.S.C. section 360bbb-3(b)(1), unless the authorization is terminated or revoked.  Performed at [facility], [address] [place], [place] [address]   Blood culture (routine x 2)     Status: None (Preliminary result)   Collection Time: 04/01/21  6:40 PM   Specimen: BLOOD  Result Value Ref Range Status   Specimen Description BLOOD SITE NOT SPECIFIED  Final   Special Requests    Final    BOTTLES DRAWN AEROBIC AND ANAEROBIC Blood Culture results may not be optimal due to an inadequate volume of blood received in culture bottles   Culture   Final    NO GROWTH 3 DAYS Performed at [facility], [address], [place], [place] [address]    Report Status PENDING  Incomplete  Blood culture (routine x 2)     Status: None (Preliminary result)   Collection Time: 04/01/21  6:48 PM   Specimen: BLOOD  Result Value Ref Range Status   Specimen Description BLOOD SITE NOT SPECIFIED  Final   Special Requests   Final    BOTTLES DRAWN AEROBIC AND ANAEROBIC Blood Culture results may not be optimal due to an inadequate volume of blood received in culture bottles   Culture   Final    NO GROWTH 3 DAYS Performed at [facility], [address] [place], [place] [address]    Report Status PENDING  Incomplete  Urine culture     Status: Abnormal   Collection Time: 04/02/21 12:42 AM   Specimen: Urine, Random  Result Value Ref Range Status   Specimen Description URINE, RANDOM  Final   Special Requests   Final    NONE Performed at [facility], [address] [place], [place] [address]    Culture >=100,000 COLONIES/mL KLEBSIELLA OXYTOCA (A)  Final   Report Status 04/04/2021 FINAL  Final   Organism ID, Bacteria KLEBSIELLA OXYTOCA (A)  Final      Susceptibility   Klebsiella oxytoca - MIC*    AMPICILLIN RESISTANT Resistant     CEFAZOLIN >=64 RESISTANT Resistant     CEFEPIME <=0.12 SENSITIVE Sensitive     CEFTRIAXONE <=0.25 SENSITIVE Sensitive     CIPROFLOXACIN <=0.25 SENSITIVE Sensitive     GENTAMICIN <=1 SENSITIVE Sensitive     IMIPENEM <=0.25 SENSITIVE Sensitive     NITROFURANTOIN <=16 SENSITIVE Sensitive     TRIMETH/SULFA <=20 SENSITIVE Sensitive     AMPICILLIN/SULBACTAM 4 SENSITIVE Sensitive     PIP/TAZO <=4 SENSITIVE Sensitive     * >=100,000 COLONIES/mL KLEBSIELLA OXYTOCA     Labs:  CBC: Recent Labs  Lab 04/01/21 1511 04/01/21 1517  04/02/21 0834 04/03/21 0154 04/04/21 0140  WBC 24.3*  --  16.2* 12.3* 10.8*  NEUTROABS 20.2*  --  12.6*  --   --   HGB 11.1* 11.9* 11.1* 9.7* 9.6*  HCT 35.2* 35.0* 34.9* 29.9* 30.2*  MCV 83.4  --  81.9 79.9* 80.5  PLT 258  --  252 214 235   BMP &GFR Recent Labs  Lab 04/01/21 1511 04/01/21  1517 04/02/21 0834 04/03/21 0154 04/04/21 0140  NA 135 136 135 134* 137  K 4.9 4.8 4.4 4.1 4.4  CL 103 105 102 105 106  CO2 18*  --  GLUCOSE 158* 154* 163* 178* 141*  BUN 26* 27* 26* 26* 23  CREATININE 2.00* 2.00* 1.39* 1.15 1.10  CALCIUM 8.4*  --  8.5* 8.0* 8.2*  MG  --   --  1.9 2.0 2.1  PHOS  --   --  3.0 2.4* 2.8   Estimated Creatinine Clearance: 62.8 mL/min (by C-G formula based on SCr of 1.1 mg/dL). Liver & Pancreas: Recent Labs  Lab 04/01/21 1511 04/02/21 0834 04/03/21 0154 04/04/21 0140  AST 22  --   --   --   ALT 14  --   --   --   ALKPHOS 63  --   --   --   BILITOT 1.0  --   --   --   PROT 7.0  --   --   --   ALBUMIN 3.0* 2.8* 2.4* 2.4*   No results for input(s): LIPASE, AMYLASE in the last 168 hours. No results for input(s): AMMONIA in the last 168 hours. Diabetic: Recent Labs    04/02/21 0420  HGBA1C 6.6*   Recent Labs  Lab 04/03/21 0800 04/03/21 1226 04/03/21 1656 04/03/21 1956 04/04/21 0727  GLUCAP 188* 193* 173* 258* 164*   Cardiac Enzymes: No results for input(s): CKTOTAL, CKMB, CKMBINDEX, TROPONINI in the last 168 hours. No results for input(s): PROBNP in the last 8760 hours. Coagulation Profile: Recent Labs  Lab 04/01/21 1511  INR 1.7*   Thyroid Function Tests: No results for input(s): TSH, T4TOTAL, FREET4, T3FREE, THYROIDAB in the last 72 hours. Lipid Profile: Recent Labs    04/02/21 0420  CHOL 118  HDL 17*  LDLCALC 50  TRIG 161*  CHOLHDL 6.9   Anemia Panel: No results for input(s): VITAMINB12, FOLATE, FERRITIN, TIBC, IRON, RETICCTPCT in the last 72 hours. Urine analysis:    Component Value Date/Time   COLORURINE  YELLOW 04/02/2021 0040   APPEARANCEUR HAZY (A) 04/02/2021 0040   APPEARANCEUR Clear 01/29/2012 1600   LABSPEC 1.042 (H) 04/02/2021 0040   LABSPEC 1.010 01/29/2012 1600   PHURINE 5.0 04/02/2021 0040   GLUCOSEU NEGATIVE 04/02/2021 0040   GLUCOSEU 50 mg/dL 09/60/4540 9811   HGBUR LARGE (A) 04/02/2021 0040   BILIRUBINUR NEGATIVE 04/02/2021 0040   BILIRUBINUR Negative 01/29/2012 1600   KETONESUR 5 (A) 04/02/2021 0040   PROTEINUR 100 (A) 04/02/2021 0040   NITRITE NEGATIVE 04/02/2021 0040   LEUKOCYTESUR NEGATIVE 04/02/2021 0040   LEUKOCYTESUR Negative 01/29/2012 1600   Sepsis Labs: Invalid input(s): PROCALCITONIN, LACTICIDVEN   Time coordinating discharge: 45 minutes  SIGNED:  Almon Hercules, MD  [facility] 04/04/2021, 8:06 PM  If 7PM-7AM, please contact night-coverage [email or web address]

## 2021-04-04 NOTE — TOC Transition Note (Signed)
Transition of Care [facility]) - CM/SW Discharge Note   Patient Details  Name: Harry [name] MRN: [id number] Date of Birth: March 07, 1941  [facility] CM/SW Contact:  Lorri Frederick, LCSW Phone Number: 04/04/2021, 9:17 AM   Clinical Narrative:   Pt discharging home with [facility].  [employer] provides 3n1.  Daughter is here at hospital to provide transport home.  No other needs identified.      Final next level of care: Home w Home Health Services Barriers to Discharge: Barriers Resolved   Patient Goals and CMS Choice Patient states their goals for this hospitalization and ongoing recovery are:: strength and mobility CMS Medicare.gov Compare Post Acute Care list provided to:: Patient Represenative (must comment) Choice offered to / list presented to : Spouse  Discharge Placement                  Name of family member notified: daughter in room currently Patient and family notified of of transfer: 04/04/21  Discharge Plan and Services     Post Acute Care Choice: Home Health          DME Arranged: 3-N-1 DME Agency: [facility] Date DME Agency Contacted: 04/04/21 Time DME Agency Contacted: [phone] Representative spoke with at DME Agency: Velna Hatchet HH Arranged: PT, OT [facility] Agency: [facility] Date [facility] Agency Contacted: 04/04/21 Time HH Agency Contacted: 0915 Representative spoke with at [facility] Agency: Stacie  Social Determinants of Health (SDOH) Interventions     Readmission Risk Interventions No flowsheet data found.

## 2021-04-06 LAB — CULTURE, BLOOD (ROUTINE X 2)
Culture: NO GROWTH
Culture: NO GROWTH

## 2021-04-17 ENCOUNTER — Other Ambulatory Visit: Payer: Self-pay

## 2021-04-17 ENCOUNTER — Ambulatory Visit: Payer: Medicare HMO | Admitting: Urology

## 2021-04-17 ENCOUNTER — Encounter: Payer: Self-pay | Admitting: Urology

## 2021-04-17 VITALS — BP 108/69 | HR 77 | Ht 67.0 in | Wt 225.0 lb

## 2021-04-17 DIAGNOSIS — N390 Urinary tract infection, site not specified: Secondary | ICD-10-CM

## 2021-04-17 DIAGNOSIS — N401 Enlarged prostate with lower urinary tract symptoms: Secondary | ICD-10-CM | POA: Diagnosis not present

## 2021-04-17 LAB — BLADDER SCAN AMB NON-IMAGING: Scan Result: 153

## 2021-04-17 NOTE — Progress Notes (Signed)
04/17/2021 1:34 PM   Harry [name] 02-04-1941 [id number]  Referring provider: Kandyce Rud, MD [phone] [address] [facility] [place],  [place] [address]  Chief Complaint  Patient presents with   New Patient (Initial Visit)    HPI: Harry [name] is a 80 y.o. male who presents for follow-up of recent hospital discharge.  Admitted to [facility] with symptoms concerning for CVA including slurred speech, expressive aphasia.  Symptoms resolved and imaging was unremarkable. Was also noted to have UA with greater than 50 RBCs and elevated lactic acid level.  He did have urinary urgency Urine culture was + Klebsiella oxytoca Neurology evaluated patient's and felt his symptoms were either due to a TIA or encephalopathy secondary to UTI Treated with IV ceftriaxone in the hospital and discharged on 4 days of cefdinir Saw Dr. Darrold Junker and follow-up and was treated with additional 4 days of cefdinir which he completed approximately 5 days ago Denies prior history of UTI Prior to his hospitalization he had no bothersome LUTS Presently voiding at baseline and has no complaints IPSS today 18/35 with bother rated 2/6     PMH: Past Medical History:  Diagnosis Date   Aortic stenosis    CAD (coronary artery disease)    Diabetes (HCC)    HTN (hypertension)    PAF (paroxysmal atrial fibrillation) (HCC)     Surgical History: Past Surgical History:  Procedure Laterality Date   PACEMAKER LEADLESS INSERTION N/A 02/20/2021   Procedure: PACEMAKER LEADLESS INSERTION;  Surgeon: Marcina Millard, MD;  Location: [facility] INVASIVE CV LAB;  Service: Cardiovascular;  Laterality: N/A;    Home Medications:  Allergies as of 04/17/2021       Reactions   Atorvastatin    Other reaction(s): Muscle Pain   Bupropion Other (See Comments)   Other reaction(s): Dizziness CNS side effects   Citalopram    Other reaction(s): Other (See Comments) GI side effects    Pioglitazone    Other reaction(s): Muscle Pain        Medication List        Accurate as of April 17, 2021  1:34 PM. If you have any questions, ask your nurse or doctor.          ASPIRIN 81 PO Take 81 mg by mouth daily.   Eliquis 5 MG Tabs tablet Generic drug: apixaban Take 1 tablet (5 mg total) by mouth 2 (two) times daily.   insulin NPH-regular Human (70-30) 100 UNIT/ML injection Inject 40-60 Units into the skin See admin instructions. Inject 40 units in the morning and 60 units in the evening.   metFORMIN 1000 MG tablet Commonly known as: GLUCOPHAGE Take 1,000 mg by mouth 2 (two) times daily with a meal.   nitroGLYCERIN 0.4 MG SL tablet Commonly known as: NITROSTAT Place 0.4 mg under the tongue every 5 (five) minutes as needed for chest pain.   Omega-3 1000 MG Caps Take 1,000 mg by mouth every evening.   pravastatin 20 MG tablet Commonly known as: PRAVACHOL Take 20 mg by mouth daily.   PRESERVISION/LUTEIN PO Take 1 capsule by mouth daily.   Trulicity 1.5 MG/0.5ML Sopn Generic drug: Dulaglutide Inject 1.5 mg into the skin See admin instructions. Every 10 days        Allergies:  Allergies  Allergen Reactions   Atorvastatin     Other reaction(s): Muscle Pain   Bupropion Other (See Comments)    Other reaction(s): Dizziness CNS side effects  Citalopram     Other reaction(s): Other (See Comments) GI side effects   Pioglitazone     Other reaction(s): Muscle Pain    Family History: History reviewed. No pertinent family history.  Social History:  reports that he has never smoked. He has never used smokeless tobacco. He reports that he does not drink alcohol and does not use drugs.   Physical Exam: BP 108/69   Pulse 77   Ht 5\' 7"  (1.702 m)   Wt 225 lb (102.1 kg)   BMI 35.24 kg/m   Constitutional:  Alert and oriented, No acute distress. HEENT: [place] AT, moist mucus membranes.  Trachea midline, no masses. Cardiovascular: No clubbing, cyanosis, or  edema. Respiratory: Normal respiratory effort, no increased work of breathing. GI: Abdomen is soft, nontender, nondistended, no abdominal masses GU: Prostate 50 g, flat smooth without nodules Psychiatric: Normal mood and affect.   Assessment & Plan:    1.  Urinary tract infection Recent hospitalization for UTI and questionable encephalopathy He was unable to give a urine today; estimated volume by bladder scan was 153 mL They were provided a sterile specimen cup and will collect the urine at home tomorrow and bring back to the lab Schedule RUS  2.  BPH with LUTS Moderate lower urinary tract symptoms though he states his symptoms are not bothersome Bladder volume today was 153 mL.  He did not attempt to void as it is difficult for him to stand   , MD  [place] [facility] [address], [address] [place], [place] [phone]

## 2021-04-18 ENCOUNTER — Other Ambulatory Visit: Payer: Medicare HMO

## 2021-04-18 DIAGNOSIS — N401 Enlarged prostate with lower urinary tract symptoms: Secondary | ICD-10-CM

## 2021-04-19 LAB — URINALYSIS, COMPLETE
Bilirubin, UA: NEGATIVE
Glucose, UA: NEGATIVE
Ketones, UA: NEGATIVE
Nitrite, UA: NEGATIVE
Specific Gravity, UA: 1.015 (ref 1.005–1.030)
Urobilinogen, Ur: 0.2 mg/dL (ref 0.2–1.0)
pH, UA: 6 (ref 5.0–7.5)

## 2021-04-19 LAB — MICROSCOPIC EXAMINATION

## 2021-04-21 ENCOUNTER — Other Ambulatory Visit: Payer: Self-pay | Admitting: Urology

## 2021-04-21 LAB — CULTURE, URINE COMPREHENSIVE

## 2021-04-21 MED ORDER — SULFAMETHOXAZOLE-TRIMETHOPRIM 800-160 MG PO TABS: 1.0000 | ORAL_TABLET | Freq: Two times a day (BID) | ORAL | 0 refills | Status: AC

## 2021-04-22 ENCOUNTER — Telehealth: Payer: Self-pay | Admitting: *Deleted

## 2021-04-22 NOTE — Telephone Encounter (Signed)
-----   Message from Riki Altes, MD sent at 04/21/2021  9:12 AM EDT ----- Urine culture returned positive for Klebsiella.  Antibiotic was sent to pharmacy

## 2021-04-22 NOTE — Telephone Encounter (Signed)
Notified patient as instructed, patient pleased °

## 2021-05-03 ENCOUNTER — Other Ambulatory Visit: Payer: Self-pay | Admitting: Urology

## 2021-05-13 ENCOUNTER — Other Ambulatory Visit: Payer: Self-pay

## 2021-05-13 ENCOUNTER — Ambulatory Visit
Admission: RE | Admit: 2021-05-13 | Discharge: 2021-05-13 | Disposition: A | Discharge status: Home / Self Care | Payer: Medicare HMO | Source: Ambulatory Visit | Attending: Urology | Admitting: Urology

## 2021-05-13 DIAGNOSIS — N390 Urinary tract infection, site not specified: Secondary | ICD-10-CM | POA: Diagnosis present

## 2021-05-13 DIAGNOSIS — N401 Enlarged prostate with lower urinary tract symptoms: Secondary | ICD-10-CM | POA: Insufficient documentation

## 2021-05-13 IMAGING — US US RENAL
1 series · 14 of 25 positions shown · non-contrast
Comparison: None.

CLINICAL DATA: UTI and BPH

EXAM:
RENAL / URINARY TRACT ULTRASOUND COMPLETE

[Series 1: us renal · 0.28mm/px · 14 of 42 slices shown]
[im 1/42]
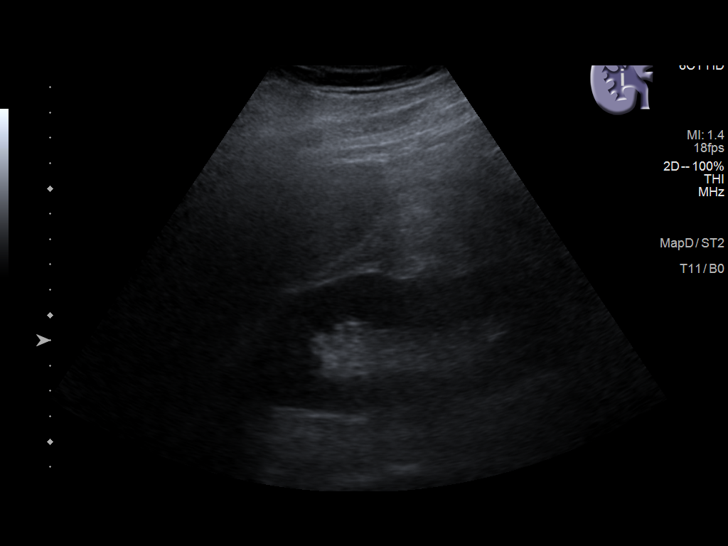
[im 4/42]
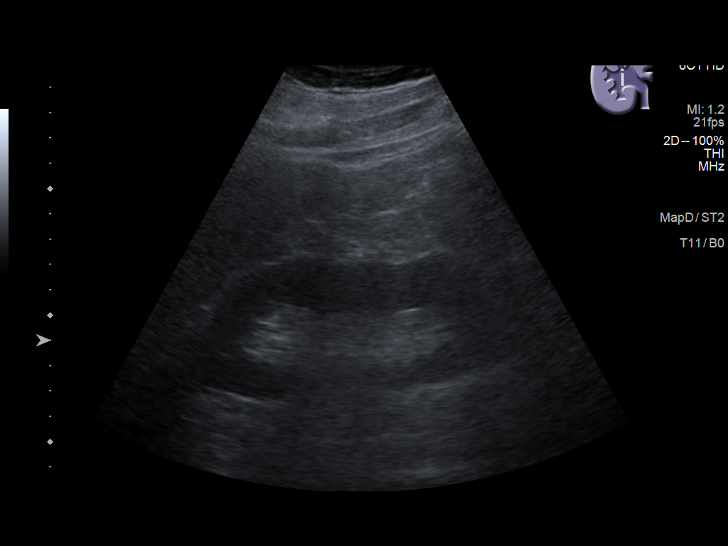
[im 7/42]
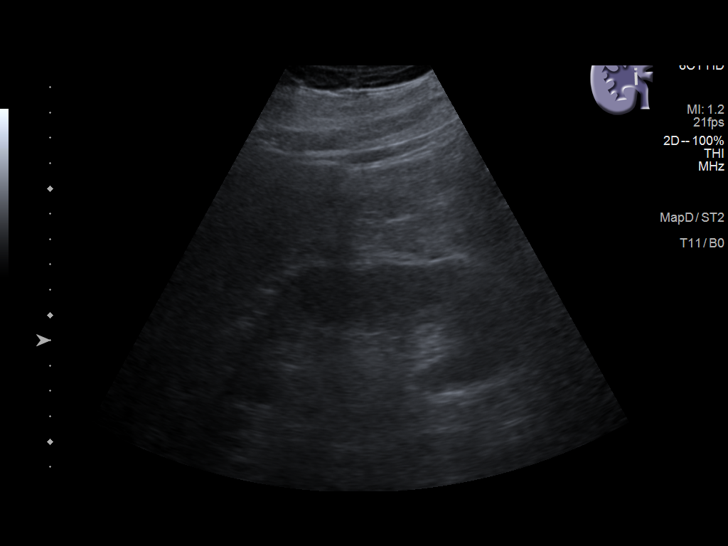
[im 11/42]
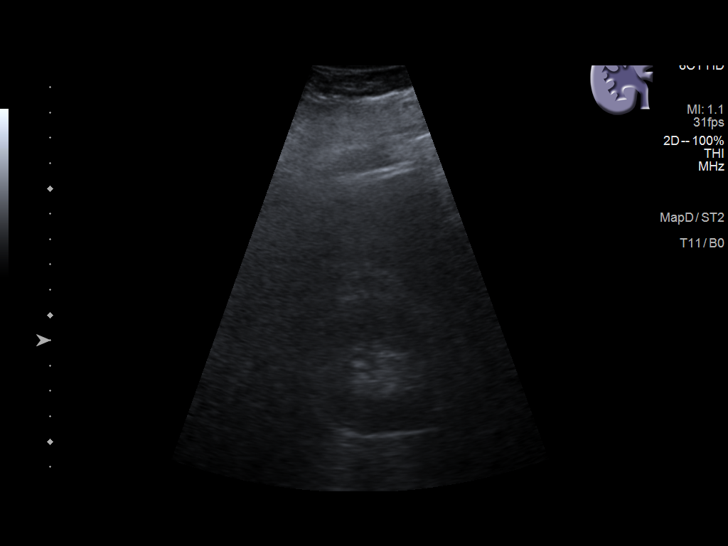
[im 14/42]
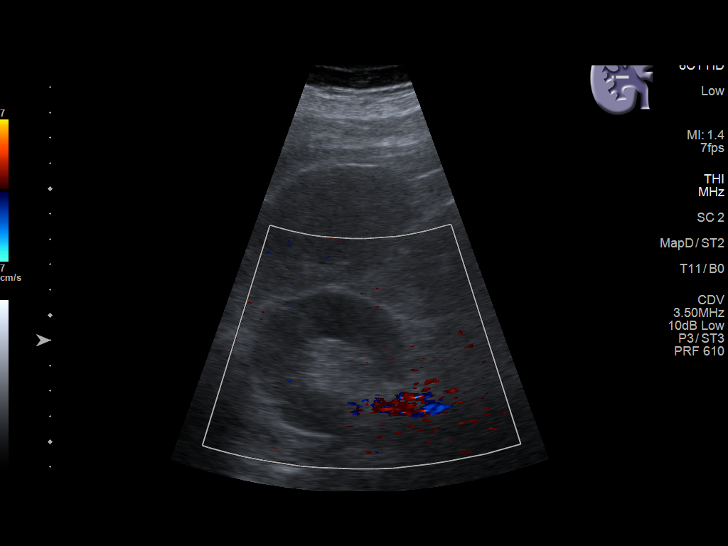
[im 16/42]
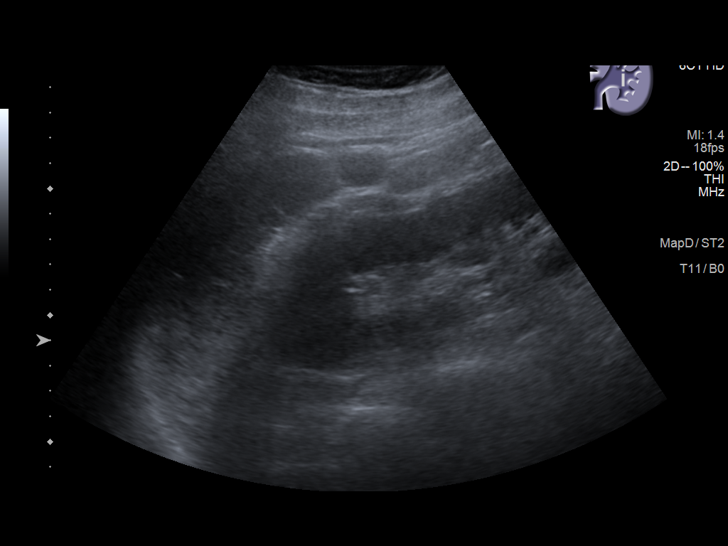
[im 19/42]
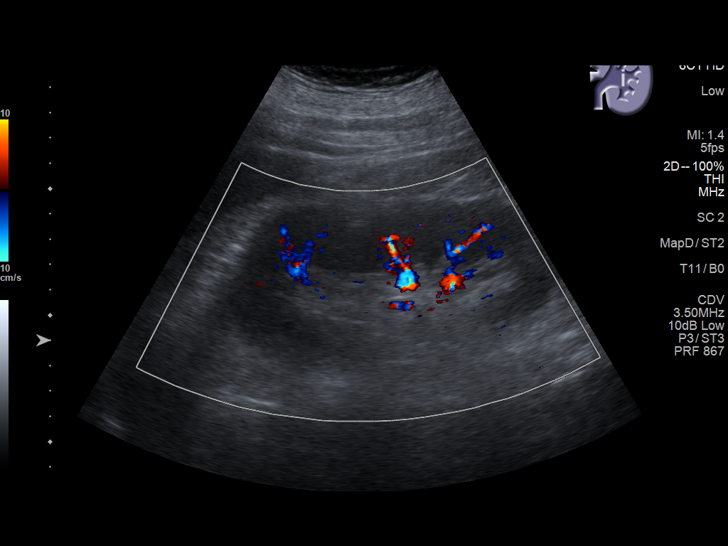
[im 23/42]
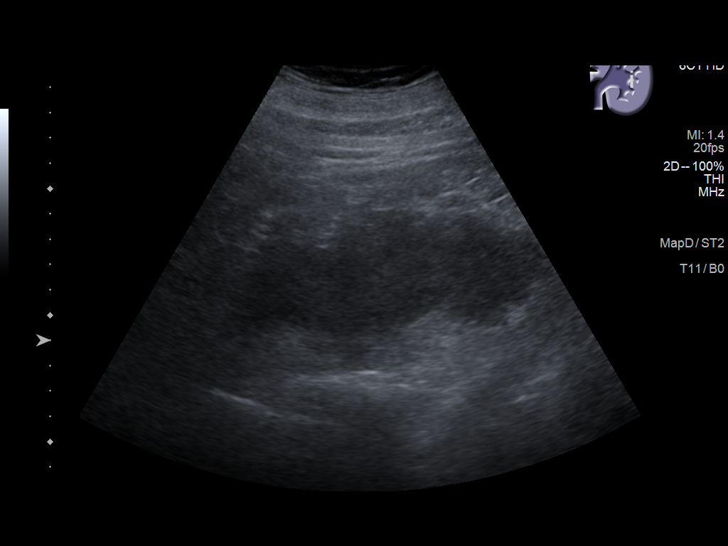
[im 26/42]
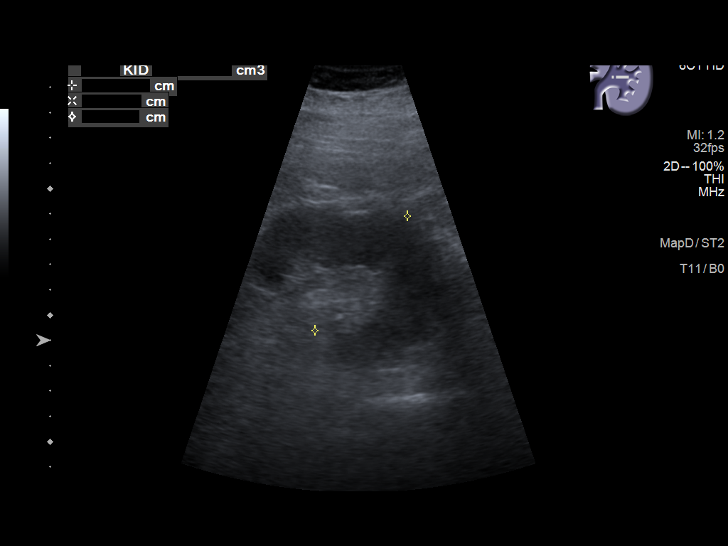
[im 28/42]
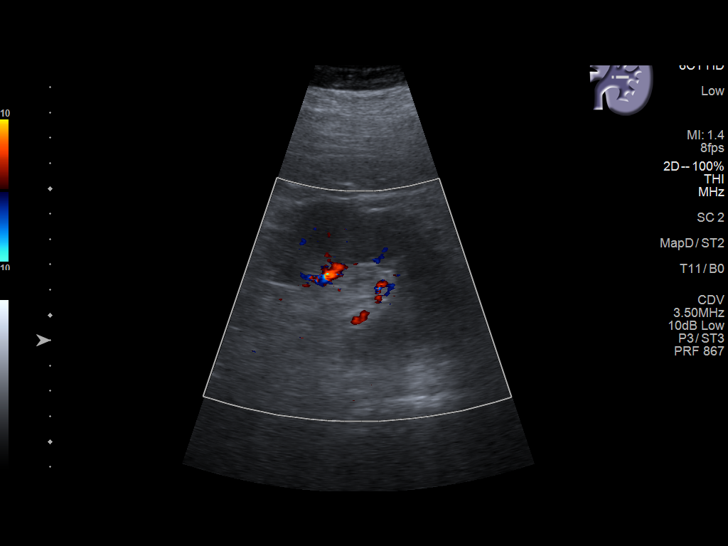
[im 31/42]
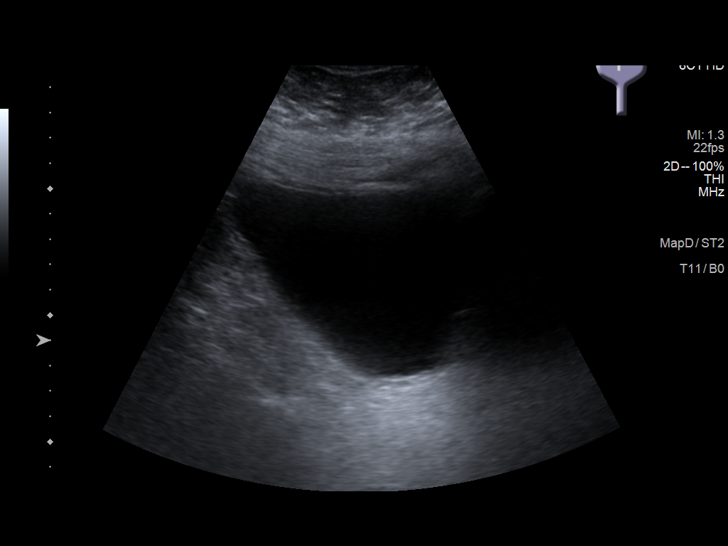
[im 35/42]
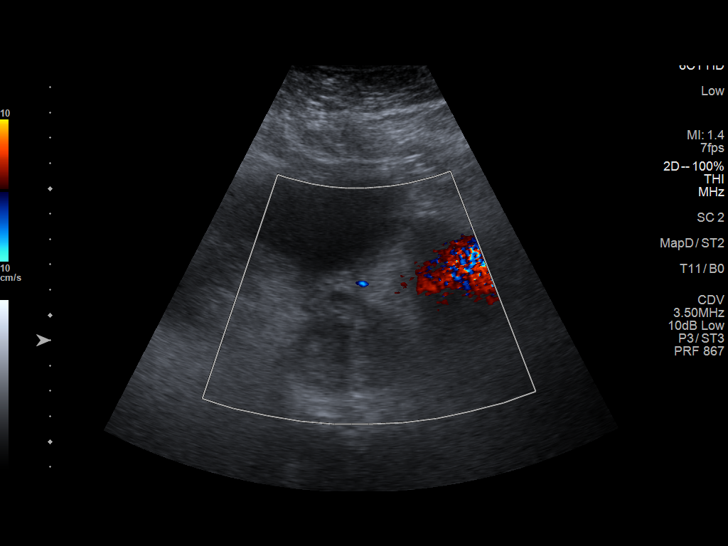
[im 38/42]
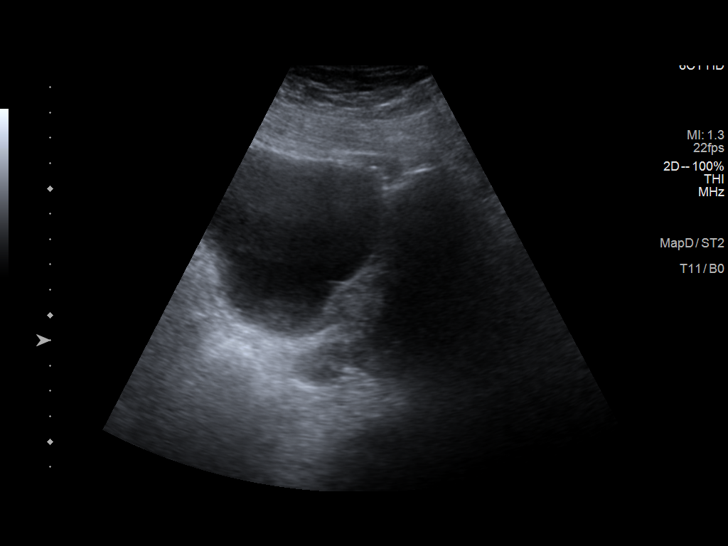
[im 42/42]
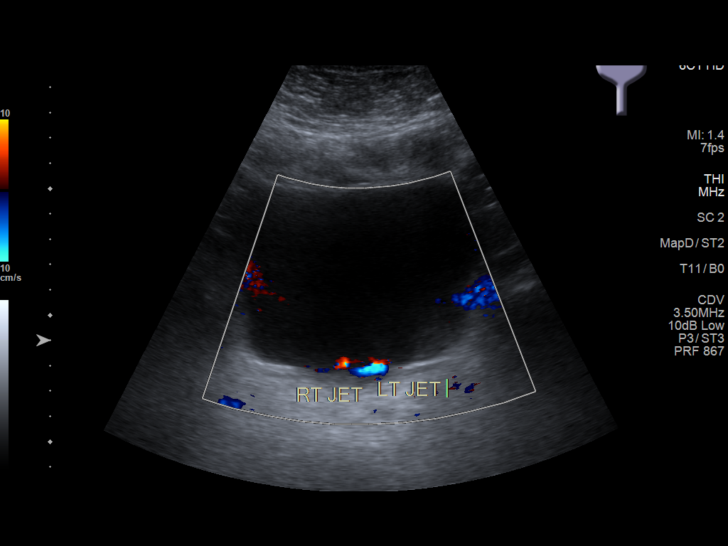

[14 of 25 positions shown; findings below may reference images not displayed]

FINDINGS: Right Kidney:

Renal measurements: 13.7 x 5.2 x 5.1 cm = volume: 191 mL.
Echogenicity within normal limits. No mass or hydronephrosis
visualized.

Left Kidney:

Renal measurements: 14.5 x 6.0 x 5.8 cm = volume: 264 mL.
Echogenicity within normal limits. No mass or hydronephrosis
visualized.

Bladder:

Appears normal for degree of bladder distention.

Other:

Diffuse increased echogenicity of the visualized portions of the
hepatic parenchyma are a nonspecific indicator of hepatocellular
dysfunction, most commonly steatosis.

Mildly enlarged prostate.
IMPRESSION: Normal sonographic appearance of the kidneys.

## 2021-05-17 ENCOUNTER — Telehealth: Payer: Self-pay

## 2021-05-17 NOTE — Telephone Encounter (Signed)
Incoming call from pt who requests the results of his u/s. He also states that he had a UTI recently and wonders if he needs to be evaluated for this. Please advise.

## 2021-05-23 NOTE — Telephone Encounter (Signed)
RUS showed no abnormalities.  Since he did have a recurrent UTI recommend scheduling follow-up visit for bladder scan and cystoscopy

## 2021-05-24 NOTE — Telephone Encounter (Signed)
Notified patient as instructed, patient pleased. Discussed follow-up appointments, patient agrees  

## 2021-06-11 NOTE — Progress Notes (Signed)
   06/12/21  CC:  Chief Complaint  Patient presents with   Cysto    HPI: Refer to my prior note 04/17/2021.  Renal ultrasound was unremarkable.  Urine culture did grow Klebsiella which was treated with Septra DS.  Repeat bladder scan today much improved at 38 mL  Refer to rooming tab for vitals NED. A&Ox3.   No respiratory distress   Abd soft, NT, ND Normal phallus with bilateral descended testicles  Cystoscopy Procedure Note  Patient identification was confirmed, informed consent was obtained, and patient was prepped using Betadine solution.  Lidocaine jelly was administered per urethral meatus.     Pre-Procedure: - Inspection reveals a normal caliber urethral meatus.  Procedure: The flexible cystoscope was introduced without difficulty - No urethral strictures/lesions are present. -  Moderate lateral lobe enlargement  prostate with hypervascularity -Mild elevation bladder neck - Bilateral ureteral orifices identified - Bladder mucosa  reveals no ulcers, tumors, or lesions - No bladder stones -Mild trabeculation  Retroflexion shows small median lobe with friability   Post-Procedure: - Patient tolerated the procedure well  Assessment/ Plan: BPH with hypervascularity Bladder emptying significantly improved Septra DS 1 p.o. given post procedure Follow-up 6 months or earlier for recurrent UTI symptoms   Riki Altes, MD

## 2021-06-12 ENCOUNTER — Encounter: Payer: Self-pay | Admitting: Urology

## 2021-06-12 ENCOUNTER — Ambulatory Visit: Payer: Medicare HMO | Admitting: Urology

## 2021-06-12 ENCOUNTER — Other Ambulatory Visit: Payer: Self-pay

## 2021-06-12 VITALS — BP 152/75 | HR 83 | Ht 67.0 in | Wt 225.0 lb

## 2021-06-12 DIAGNOSIS — N390 Urinary tract infection, site not specified: Secondary | ICD-10-CM

## 2021-06-12 DIAGNOSIS — N401 Enlarged prostate with lower urinary tract symptoms: Secondary | ICD-10-CM

## 2021-06-12 LAB — BLADDER SCAN AMB NON-IMAGING: Scan Result: 38

## 2021-06-12 MED ORDER — SULFAMETHOXAZOLE-TRIMETHOPRIM 800-160 MG PO TABS
1.0000 | ORAL_TABLET | Freq: Once | ORAL | Status: AC
  Administered 2021-06-12: 1 via ORAL

## 2021-06-13 LAB — URINALYSIS, COMPLETE
Bilirubin, UA: NEGATIVE
Glucose, UA: NEGATIVE
Ketones, UA: NEGATIVE
Nitrite, UA: NEGATIVE
RBC, UA: NEGATIVE
Specific Gravity, UA: 1.015 (ref 1.005–1.030)
Urobilinogen, Ur: 0.2 mg/dL (ref 0.2–1.0)
pH, UA: 6 (ref 5.0–7.5)

## 2021-06-13 LAB — MICROSCOPIC EXAMINATION: Bacteria, UA: NONE SEEN

## 2021-06-16 LAB — CULTURE, URINE COMPREHENSIVE

## 2021-08-28 ENCOUNTER — Ambulatory Visit: Payer: Medicare HMO | Admitting: Urology

## 2021-09-05 ENCOUNTER — Ambulatory Visit: Payer: Medicare HMO | Admitting: Urology

## 2021-09-14 ENCOUNTER — Emergency Department: Payer: Medicare HMO

## 2021-09-14 ENCOUNTER — Inpatient Hospital Stay
Admission: EM | Admit: 2021-09-14 | Discharge: 2021-09-17 | DRG: 282 | Disposition: A | Discharge status: Other Acute Inpatient Hospital | Payer: Medicare HMO | Attending: Internal Medicine | Admitting: Internal Medicine

## 2021-09-14 ENCOUNTER — Other Ambulatory Visit: Payer: Self-pay

## 2021-09-14 DIAGNOSIS — E119 Type 2 diabetes mellitus without complications: Secondary | ICD-10-CM | POA: Diagnosis present

## 2021-09-14 DIAGNOSIS — Z20822 Contact with and (suspected) exposure to covid-19: Secondary | ICD-10-CM | POA: Diagnosis present

## 2021-09-14 DIAGNOSIS — Z79899 Other long term (current) drug therapy: Secondary | ICD-10-CM

## 2021-09-14 DIAGNOSIS — E669 Obesity, unspecified: Secondary | ICD-10-CM | POA: Diagnosis present

## 2021-09-14 DIAGNOSIS — Z950 Presence of cardiac pacemaker: Secondary | ICD-10-CM

## 2021-09-14 DIAGNOSIS — Z6834 Body mass index (BMI) 34.0-34.9, adult: Secondary | ICD-10-CM

## 2021-09-14 DIAGNOSIS — G4733 Obstructive sleep apnea (adult) (pediatric): Secondary | ICD-10-CM | POA: Diagnosis present

## 2021-09-14 DIAGNOSIS — I495 Sick sinus syndrome: Secondary | ICD-10-CM | POA: Diagnosis present

## 2021-09-14 DIAGNOSIS — I10 Essential (primary) hypertension: Secondary | ICD-10-CM | POA: Diagnosis present

## 2021-09-14 DIAGNOSIS — N400 Enlarged prostate without lower urinary tract symptoms: Secondary | ICD-10-CM | POA: Diagnosis present

## 2021-09-14 DIAGNOSIS — I480 Paroxysmal atrial fibrillation: Secondary | ICD-10-CM | POA: Diagnosis present

## 2021-09-14 DIAGNOSIS — Z7984 Long term (current) use of oral hypoglycemic drugs: Secondary | ICD-10-CM | POA: Diagnosis not present

## 2021-09-14 DIAGNOSIS — Z7982 Long term (current) use of aspirin: Secondary | ICD-10-CM | POA: Diagnosis not present

## 2021-09-14 DIAGNOSIS — I214 Non-ST elevation (NSTEMI) myocardial infarction: Principal | ICD-10-CM | POA: Diagnosis present

## 2021-09-14 DIAGNOSIS — Z888 Allergy status to other drugs, medicaments and biological substances status: Secondary | ICD-10-CM

## 2021-09-14 DIAGNOSIS — I4891 Unspecified atrial fibrillation: Secondary | ICD-10-CM | POA: Diagnosis present

## 2021-09-14 DIAGNOSIS — I25110 Atherosclerotic heart disease of native coronary artery with unstable angina pectoris: Secondary | ICD-10-CM | POA: Diagnosis present

## 2021-09-14 DIAGNOSIS — R079 Chest pain, unspecified: Secondary | ICD-10-CM

## 2021-09-14 DIAGNOSIS — Z794 Long term (current) use of insulin: Secondary | ICD-10-CM

## 2021-09-14 DIAGNOSIS — E785 Hyperlipidemia, unspecified: Secondary | ICD-10-CM | POA: Diagnosis present

## 2021-09-14 DIAGNOSIS — I350 Nonrheumatic aortic (valve) stenosis: Secondary | ICD-10-CM | POA: Diagnosis present

## 2021-09-14 DIAGNOSIS — Z7901 Long term (current) use of anticoagulants: Secondary | ICD-10-CM

## 2021-09-14 LAB — CBC
HCT: 34.7 % — ABNORMAL LOW (ref 39.0–52.0)
Hemoglobin: 10.8 g/dL — ABNORMAL LOW (ref 13.0–17.0)
MCH: 23.7 pg — ABNORMAL LOW (ref 26.0–34.0)
MCHC: 31.1 g/dL (ref 30.0–36.0)
MCV: 76.3 fL — ABNORMAL LOW (ref 80.0–100.0)
Platelets: 292 10*3/uL (ref 150–400)
RBC: 4.55 MIL/uL (ref 4.22–5.81)
RDW: 17.7 % — ABNORMAL HIGH (ref 11.5–15.5)
WBC: 11.3 10*3/uL — ABNORMAL HIGH (ref 4.0–10.5)
nRBC: 0 % (ref 0.0–0.2)

## 2021-09-14 LAB — COMPREHENSIVE METABOLIC PANEL WITH GFR
ALT: 17 U/L (ref 0–44)
AST: 35 U/L (ref 15–41)
Albumin: 3.7 g/dL (ref 3.5–5.0)
Alkaline Phosphatase: 68 U/L (ref 38–126)
Anion gap: 9 (ref 5–15)
BUN: 15 mg/dL (ref 8–23)
CO2: 25 mmol/L (ref 22–32)
Calcium: 9 mg/dL (ref 8.9–10.3)
Chloride: 101 mmol/L (ref 98–111)
Creatinine, Ser: 0.9 mg/dL (ref 0.61–1.24)
GFR, Estimated: >60 mL/min
Glucose, Bld: 113 mg/dL — ABNORMAL HIGH (ref 70–99)
Potassium: 4.3 mmol/L (ref 3.5–5.1)
Sodium: 135 mmol/L (ref 135–145)
Total Bilirubin: 0.7 mg/dL (ref 0.3–1.2)
Total Protein: 7.4 g/dL (ref 6.5–8.1)

## 2021-09-14 LAB — TROPONIN I (HIGH SENSITIVITY)
Troponin I (High Sensitivity): 611 ng/L (ref <18)
Troponin I (High Sensitivity): 3093 ng/L
Troponin I (High Sensitivity): 12813 ng/L (ref <18)

## 2021-09-14 LAB — RESP PANEL BY RT-PCR (FLU A&B, COVID) ARPGX2
Influenza A by PCR: NEGATIVE
Influenza B by PCR: NEGATIVE
SARS Coronavirus 2 by RT PCR: NEGATIVE

## 2021-09-14 LAB — TSH: TSH: 2.782 u[IU]/mL (ref 0.350–4.500)

## 2021-09-14 LAB — BRAIN NATRIURETIC PEPTIDE: B Natriuretic Peptide: 178.3 pg/mL — ABNORMAL HIGH (ref 0.0–100.0)

## 2021-09-14 LAB — HEPARIN LEVEL (UNFRACTIONATED): Heparin Unfractionated: >1.1 IU/mL — ABNORMAL HIGH (ref 0.30–0.70)

## 2021-09-14 LAB — APTT: aPTT: 36 seconds (ref 24–36)

## 2021-09-14 IMAGING — DX DG CHEST 1V PORT
2 series · 2 of 2 positions shown · non-contrast
Comparison: [DATE]

CLINICAL DATA: Chest pain

EXAM:
PORTABLE CHEST 1 VIEW

[chest ap (1 of 2)]
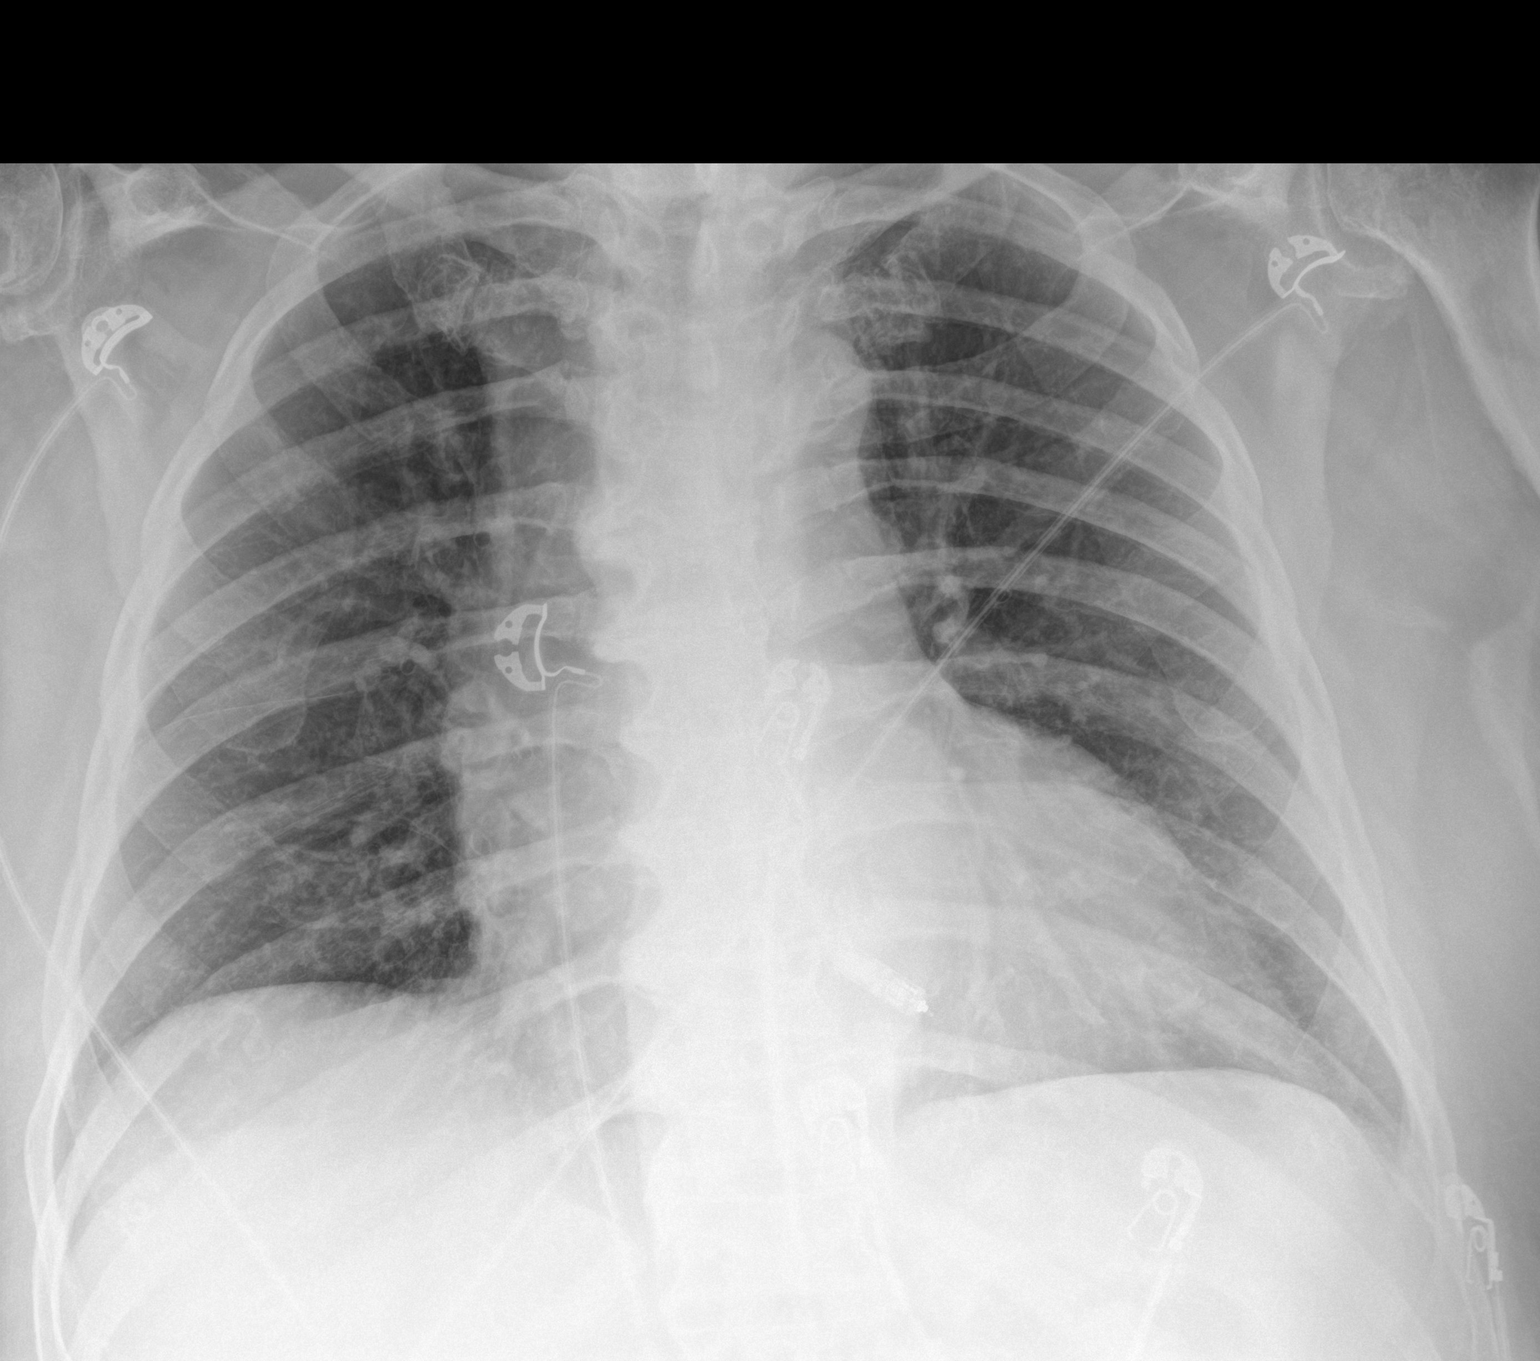

[chest ap (2 of 2)]
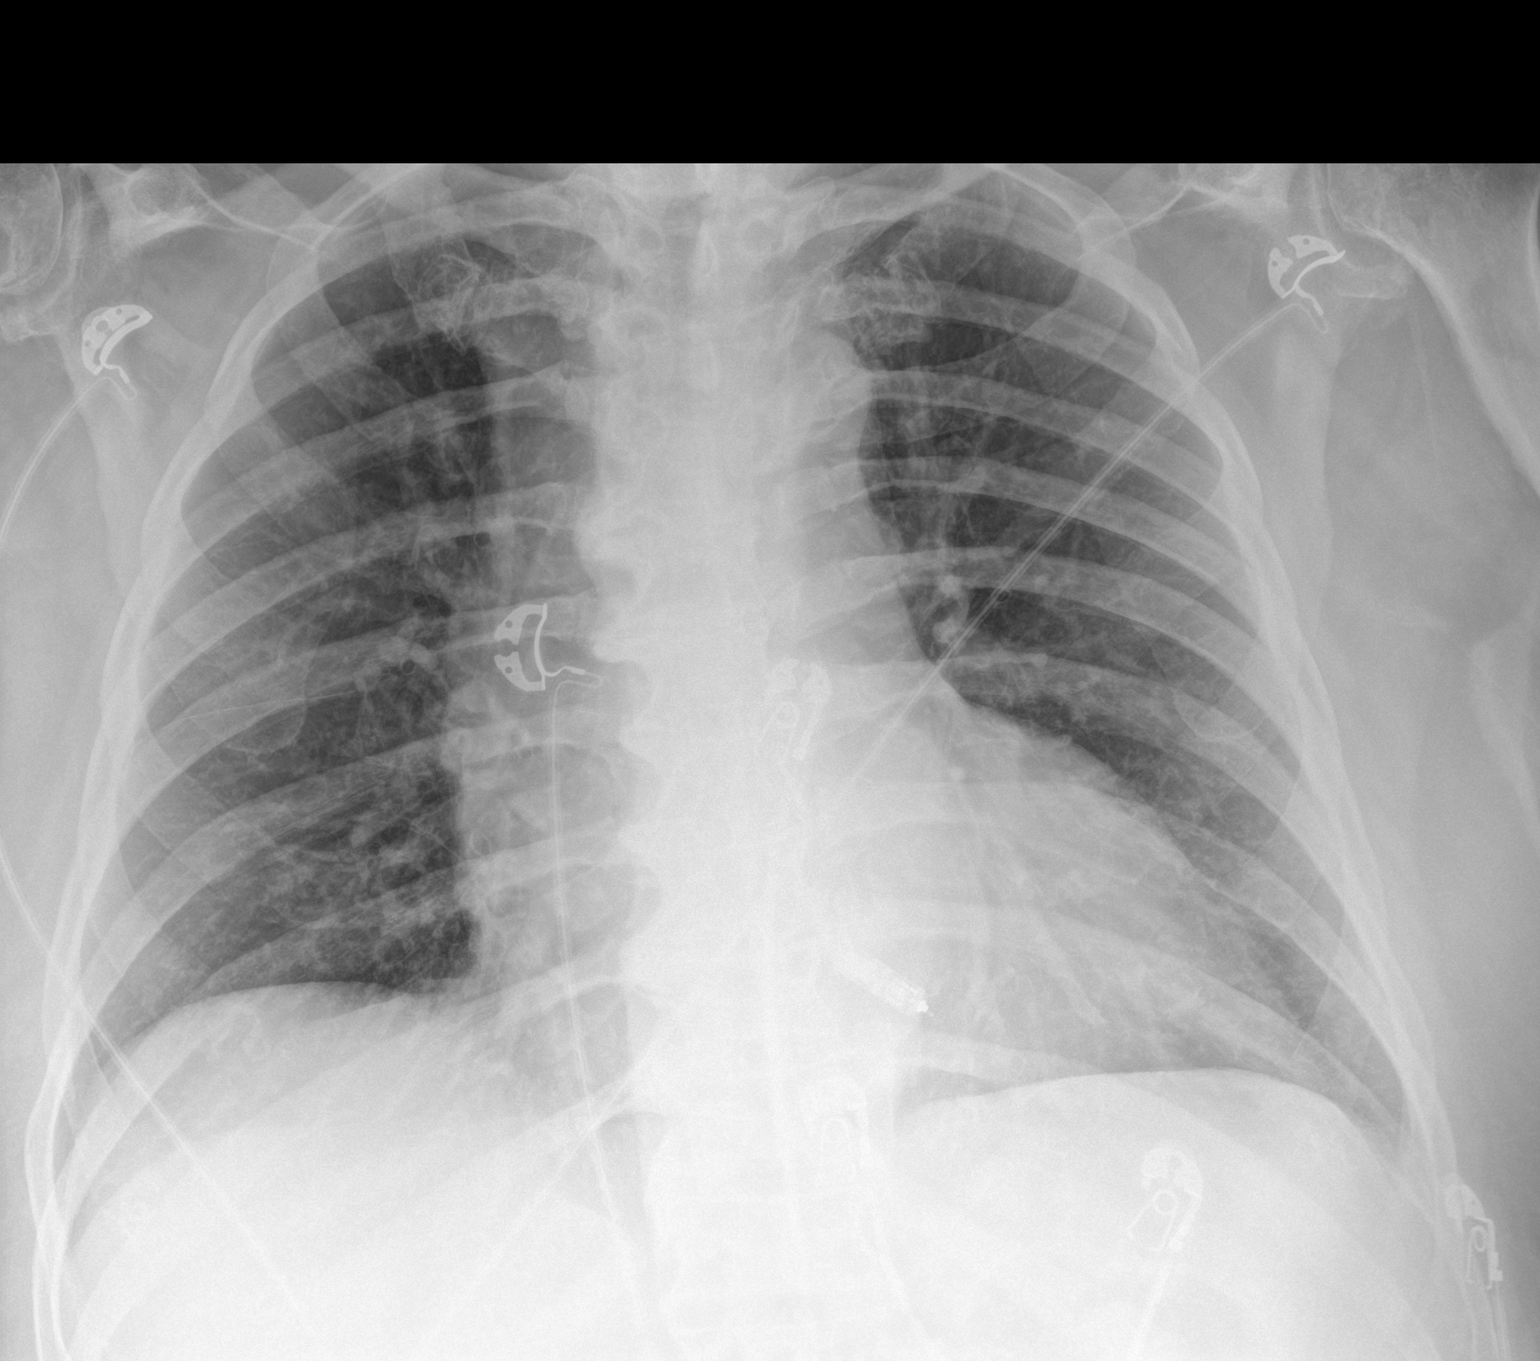

[2 of 2 positions shown; findings below may reference images not displayed]

FINDINGS: Cardiomegaly. Both lungs are clear. The visualized skeletal
structures are unremarkable.
IMPRESSION: Cardiomegaly without acute abnormality of the lungs in AP portable
projection.

## 2021-09-14 IMAGING — DX DG CHEST 1V PORT
1 series · 1 of 1 positions shown · non-contrast
Comparison: [DATE]

CLINICAL DATA: Chest pain

EXAM:
PORTABLE CHEST 1 VIEW

[chest ap]
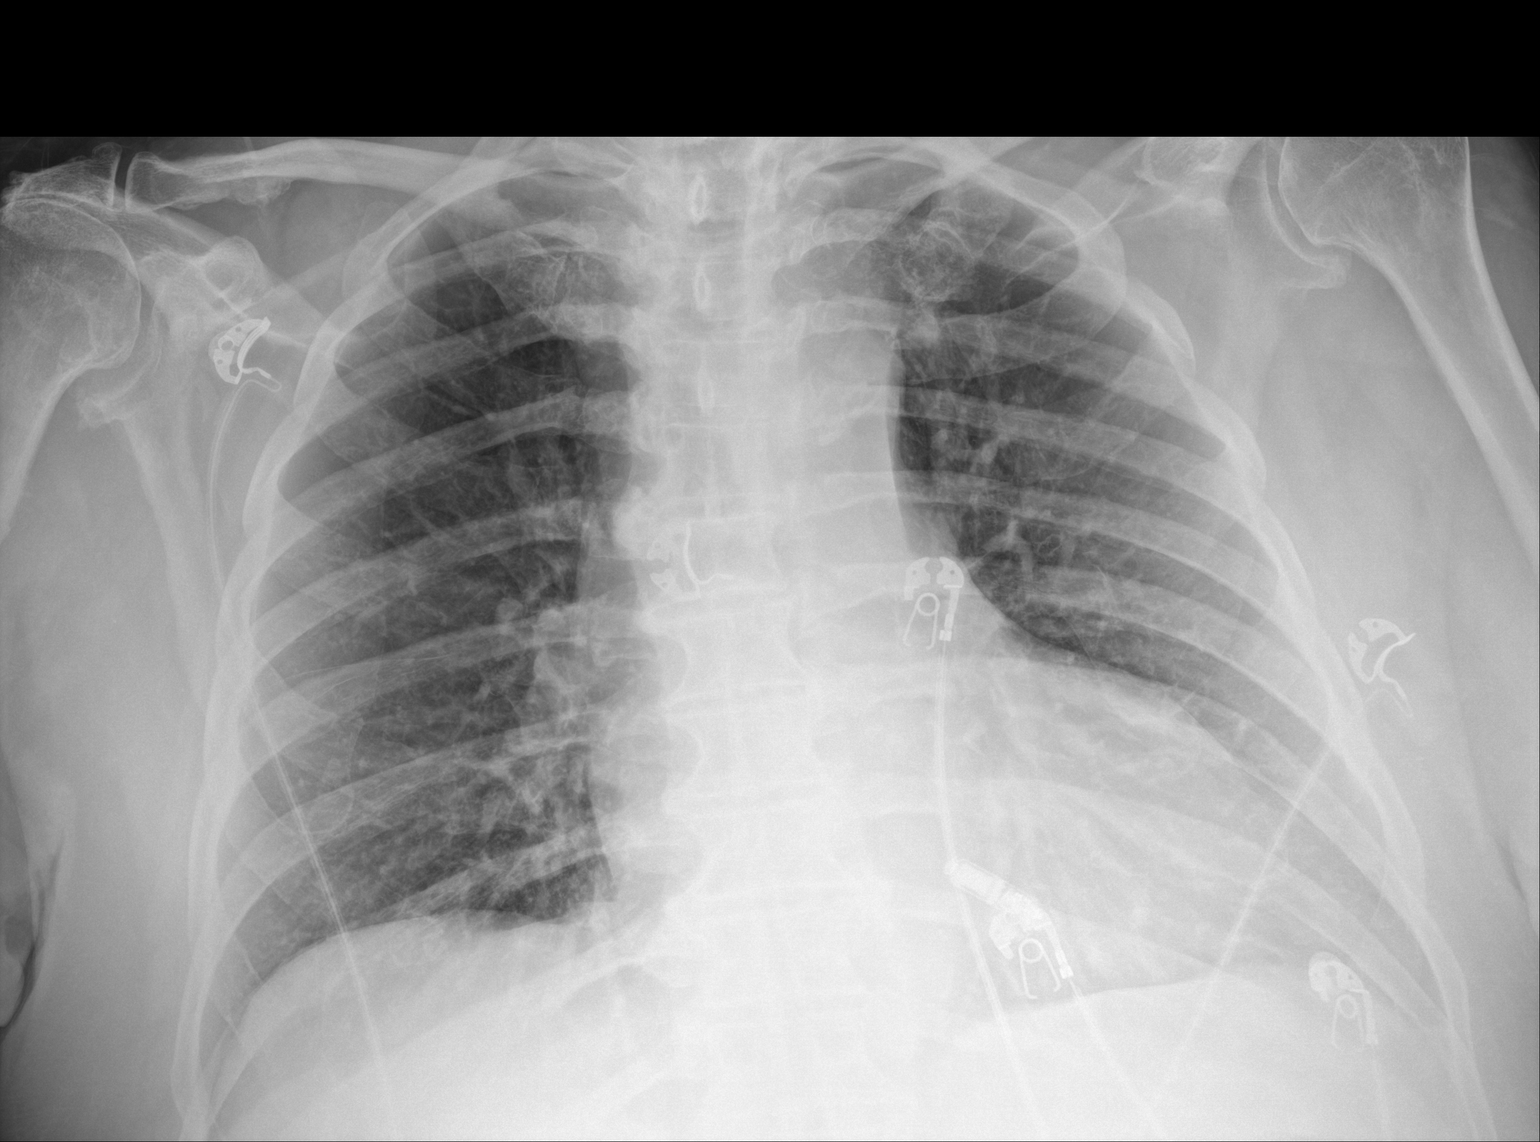

[1 of 1 positions shown; findings below may reference images not displayed]

FINDINGS: Cardiomegaly. Both lungs are clear. The visualized skeletal
structures are unremarkable.
IMPRESSION: Cardiomegaly without acute abnormality of the lungs in AP portable
projection.

## 2021-09-14 MED ORDER — PRAVASTATIN SODIUM 40 MG PO TABS
80.0000 mg | ORAL_TABLET | Freq: Every day | ORAL | Status: DC
  Administered 2021-09-14 – 2021-09-16 (×3): 80 mg via ORAL
  Filled 2021-09-14: qty 4
  Filled 2021-09-14 (×2): qty 2

## 2021-09-14 MED ORDER — DILTIAZEM HCL-DEXTROSE 125-5 MG/125ML-% IV SOLN (PREMIX)
5.0000 mg/h | INTRAVENOUS | Status: DC
  Administered 2021-09-14: 17:00:00 5 mg/h via INTRAVENOUS
  Filled 2021-09-14 (×2): qty 125

## 2021-09-14 MED ORDER — NITROGLYCERIN 0.4 MG SL SUBL: 0.4000 mg | SUBLINGUAL_TABLET | SUBLINGUAL | Status: DC | PRN

## 2021-09-14 MED ORDER — PRAVASTATIN SODIUM 20 MG PO TABS: 20.0000 mg | ORAL_TABLET | Freq: Every day | ORAL | Status: DC

## 2021-09-14 MED ORDER — HEPARIN (PORCINE) 25000 UT/250ML-% IV SOLN
1150.0000 [IU]/h | INTRAVENOUS | Status: DC
  Administered 2021-09-14: 17:00:00 1150 [IU]/h via INTRAVENOUS
  Filled 2021-09-14: qty 250

## 2021-09-14 MED ORDER — PRESERVISION/LUTEIN PO CAPS: ORAL_CAPSULE | Freq: Every day | ORAL | Status: DC

## 2021-09-14 MED ORDER — OCUVITE-LUTEIN PO CAPS
1.0000 | ORAL_CAPSULE | Freq: Every day | ORAL | Status: DC
  Administered 2021-09-15 – 2021-09-16 (×2): 1 via ORAL
  Filled 2021-09-14 (×4): qty 1

## 2021-09-14 MED ORDER — OMEGA-3 1000 MG PO CAPS: 1000.0000 mg | ORAL_CAPSULE | Freq: Every evening | ORAL | Status: DC

## 2021-09-14 MED ORDER — HEPARIN (PORCINE) 25000 UT/250ML-% IV SOLN
1600.0000 [IU]/h | INTRAVENOUS | Status: DC
  Administered 2021-09-15: 11:00:00 1300 [IU]/h via INTRAVENOUS
  Administered 2021-09-16: 07:00:00 1450 [IU]/h via INTRAVENOUS
  Administered 2021-09-17: 21:00:00 1600 [IU]/h via INTRAVENOUS
  Administered 2021-09-17: 01:00:00 1450 [IU]/h via INTRAVENOUS
  Filled 2021-09-14 (×4): qty 250

## 2021-09-14 MED ORDER — INSULIN GLARGINE-YFGN 100 UNIT/ML ~~LOC~~ SOLN
20.0000 [IU] | Freq: Two times a day (BID) | SUBCUTANEOUS | Status: DC
  Administered 2021-09-15 – 2021-09-16 (×5): 20 [IU] via SUBCUTANEOUS
  Filled 2021-09-14 (×9): qty 0.2

## 2021-09-14 MED ORDER — SODIUM CHLORIDE 0.9 % IV SOLN: INTRAVENOUS | Status: AC

## 2021-09-14 MED ORDER — ASPIRIN EC 81 MG PO TBEC
81.0000 mg | DELAYED_RELEASE_TABLET | Freq: Every day | ORAL | Status: DC
  Administered 2021-09-15 – 2021-09-16 (×2): 81 mg via ORAL
  Filled 2021-09-14 (×2): qty 1

## 2021-09-14 MED ORDER — OMEGA-3-ACID ETHYL ESTERS 1 G PO CAPS
1.0000 g | ORAL_CAPSULE | Freq: Every day | ORAL | Status: DC
Start: 2021-09-14 — End: 2021-09-18
  Administered 2021-09-14 – 2021-09-16 (×3): 1 g via ORAL
  Filled 2021-09-14 (×4): qty 1

## 2021-09-14 MED ORDER — ASPIRIN 81 MG PO CHEW
81.0000 mg | CHEWABLE_TABLET | Freq: Every day | ORAL | Status: DC
Start: 2021-09-14 — End: 2021-09-14

## 2021-09-14 MED ORDER — ONDANSETRON HCL 4 MG/2ML IJ SOLN: 4.0000 mg | Freq: Four times a day (QID) | INTRAMUSCULAR | Status: DC | PRN

## 2021-09-14 MED ORDER — DILTIAZEM HCL 25 MG/5ML IV SOLN
10.0000 mg | Freq: Once | INTRAVENOUS | Status: AC
  Administered 2021-09-14: 17:00:00 10 mg via INTRAVENOUS
  Filled 2021-09-14: qty 5

## 2021-09-14 MED ORDER — ACETAMINOPHEN 325 MG PO TABS: 650.0000 mg | ORAL_TABLET | ORAL | Status: DC | PRN

## 2021-09-14 MED ORDER — METOPROLOL TARTRATE 25 MG PO TABS
12.5000 mg | ORAL_TABLET | Freq: Two times a day (BID) | ORAL | Status: DC
  Filled 2021-09-14 (×2): qty 1

## 2021-09-14 MED ORDER — DULAGLUTIDE 1.5 MG/0.5ML ~~LOC~~ SOAJ: 1.5000 mg | SUBCUTANEOUS | Status: DC

## 2021-09-14 NOTE — H&P (Addendum)
History and Physical    Harry [name] HBZ:[id number] DOB: 08-11-1941 DOA: 09/14/2021  PCP: Kandyce Rud, MD  Patient coming from: home  I have personally briefly reviewed patient's old medical records in [facility]  Chief Complaint: chest pain  HPI: Harry [name] is a 80 y.o. 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 Patient states it [place]. He notes he took nitro x 2 w/o any improvement in symptoms. He notes chest pain was left side  and described as sharp and pressure like. He noted no dizziness, no sob, no nausea no radiation of pain. He notes pain lasted for about 45 minutess. He was given asa by EMS and per patient pain resolved prior to arrival in ED. Patient notes that he does not get frequent chest discomfort but states he leads a very sedentary life style. He denies any cough/fever/chills/abdominal pain/dysuria or hematuria. On evaluation in ed patient found to have NSTEMI with ce [phone], patient also noted to be in afib with rvr to 130. Cardiology was consulted and patient was started on heparin drip as well as ccb drip.  ED Course: Afeb, bp 138/80, rr18, hr 113 -129afib rvr  Wbc:11, hgb 10.8 NA:135, cr 0.9 CE 611, 3093 Cxr; NAD Ekg: afib/flut v paced complexes   Tx Heparin drip  CCB drip   Review of Systems: As per HPI otherwise 10 point review of systems negative.   Past Medical History:  Diagnosis Date   Aortic stenosis    CAD (coronary artery disease)    Diabetes (HCC)    HTN (hypertension)    PAF (paroxysmal atrial fibrillation) (HCC)     Past Surgical History:  Procedure Laterality Date   PACEMAKER LEADLESS INSERTION N/A 02/20/2021   Procedure: PACEMAKER LEADLESS INSERTION;  Surgeon: Marcina Millard,  MD;  Location: [facility] INVASIVE CV LAB;  Service: Cardiovascular;  Laterality: N/A;     reports that he has never smoked. He has never used smokeless tobacco. He reports that he does not drink alcohol and does not use drugs.  Allergies  Allergen Reactions   Atorvastatin     Other reaction(s): Muscle Pain   Bupropion Other (See Comments)    Other reaction(s): Dizziness CNS side effects   Citalopram     Other reaction(s): Other (See Comments) GI side effects   Pioglitazone     Other reaction(s): Muscle Pain    No family history on file. Sister:with heart disease  Prior to Admission medications   Medication Sig Start Date End Date Taking? Authorizing Provider  apixaban (ELIQUIS) 5 MG TABS tablet Take 1 tablet (5 mg total) by mouth 2 (two) times daily. 02/22/21  Yes Delfino Lovett, MD  ASPIRIN 81 PO Take 81 mg by mouth daily. 06/28/10  Yes [provider]  doxycycline (VIBRA-TABS) 100 MG tablet Take 100 mg by mouth 2 (two) times daily. 09/05/21  Yes [provider]  insulin NPH-regular Human (70-30) 100 UNIT/ML injection Inject 40-60 Units into the skin See admin instructions. Inject 40 units in the morning and 60 units in the evening.   Yes [provider]  metFORMIN (GLUCOPHAGE) 1000 MG tablet Take 1,000 mg by mouth 2 (two) times daily with a meal. 04/20/20  Yes [provider]  Multiple Vitamins-Minerals (PRESERVISION/LUTEIN PO) Take 1 capsule by mouth daily.   Yes [provider]  nitroGLYCERIN (NITROSTAT) 0.4 MG SL tablet Place 0.4 mg under the tongue every 5 (five) minutes as needed for chest pain. 11/27/17  Yes [provider]  Omega-3 1000 MG CAPS Take 1,000 mg by mouth every evening. 12/27/10  Yes [provider]  pravastatin (PRAVACHOL) 20 MG tablet Take 20 mg by mouth daily. 06/29/20  Yes [provider]  TRULICITY 1.5 MG/0.5ML SOPN Inject 1.5 mg into the skin See admin instructions. Every 10 days 10/01/20  Yes [provider]  diltiazem (CARDIZEM CD) 240 MG 24 hr capsule Take 240 mg by mouth daily. Patient not taking: No sig reported 12/24/20 04/02/21  [provider]  lisinopril (ZESTRIL) 5 MG tablet Take 1 tablet by mouth daily. Patient not taking: No sig reported 06/29/20 04/02/21  [provider]    Physical Exam: Vitals:   09/14/21 1421 09/14/21 1530 09/14/21 1600 09/14/21 1630  BP: 138/80 116/70 132/71 137/85  Pulse: (!) 113 63 (!) 126 62  Resp: 18 16 20 17   Temp: 98.7 F (37.1 C)     TempSrc: Oral     SpO2: 96% 98% 96% 94%  Weight: 100.7 kg     Height: 5\' 7"  (1.702 m)       Constitutional: NAD, calm, comfortable,obese Vitals:   09/14/21 1421 09/14/21 1530 09/14/21 1600 09/14/21 1630  BP: 138/80 116/70 132/71 137/85  Pulse: (!) 113 63 (!) 126 62  Resp: 18 16 20 17   Temp: 98.7 F (37.1 C)     TempSrc: Oral     SpO2: 96% 98% 96% 94%  Weight: 100.7 kg     Height: 5\' 7"  (1.702 m)      Eyes: PERRL, lids and conjunctivae normal ENMT: Mucous membranes are moist. Posterior pharynx clear of any exudate or lesions.Normal dentition.  Neck: normal, supple, no masses, no thyromegaly Respiratory: clear to auscultation bilaterally, no wheezing, no crackles. Normal respiratory effort. No accessory muscle use.  Cardiovascular: iRegular rate and rhythm, + murmurs , no rubs / gallops. No extremity edema. 2+ pedal pulses. No carotid bruits.  Abdomen: no tenderness, no masses palpated. No hepatosplenomegaly. Bowel sounds positive.  Musculoskeletal: no clubbing / cyanosis. No joint deformity upper and lower extremities. Good ROM, no contractures. Normal muscle tone.  Skin: no rashes, lesions, ulcers. No induration Neurologic: CN 2-12 grossly intact. Sensation intact,Strength 5/5 in all 4.  Psychiatric: Normal judgment and insight. Alert and oriented x 3. Normal mood.    Labs on Admission: I have personally reviewed following labs and imaging studies  CBC: Recent Labs  Lab  09/14/21 1434  WBC 11.3*  HGB 10.8*  HCT 34.7*  MCV 76.3*  PLT 292   Basic Metabolic Panel: Recent Labs  Lab 09/14/21 1434  NA 135  K 4.3  CL 101  CO2 25  GLUCOSE 113*  BUN 15  CREATININE 0.90  CALCIUM 9.0   GFR: Estimated Creatinine Clearance: 76.4 mL/min (by C-G formula based on SCr of 0.9 mg/dL). Liver Function Tests: Recent Labs  Lab 09/14/21 1434  AST 35  ALT 17  ALKPHOS 68  BILITOT 0.7  PROT 7.4  ALBUMIN 3.7   No results for input(s): LIPASE, AMYLASE in the last 168 hours. No results for input(s): AMMONIA in the last 168 hours. Coagulation Profile: No results for input(s): INR, PROTIME in the last 168 hours. Cardiac Enzymes: No results for input(s): CKTOTAL, CKMB, CKMBINDEX,  TROPONINI in the last 168 hours. BNP (last 3 results) No results for input(s): PROBNP in the last 8760 hours. HbA1C: No results for input(s): HGBA1C in the last 72 hours. CBG: No results for input(s): GLUCAP in the last 168 hours. Lipid Profile: No results for input(s): CHOL, HDL, LDLCALC, TRIG, CHOLHDL, LDLDIRECT in the last 72 hours. Thyroid Function Tests: No results for input(s): TSH, T4TOTAL, FREET4, T3FREE, THYROIDAB in the last 72 hours. Anemia Panel: No results for input(s): VITAMINB12, FOLATE, FERRITIN, TIBC, IRON, RETICCTPCT in the last 72 hours. Urine analysis:    Component Value Date/Time   COLORURINE YELLOW 04/02/2021 0040   APPEARANCEUR Clear 06/12/2021 0856   LABSPEC 1.042 (H) 04/02/2021 0040   LABSPEC 1.010 01/29/2012 1600   PHURINE 5.0 04/02/2021 0040   GLUCOSEU Negative 06/12/2021 0856   GLUCOSEU 50 mg/dL 01/29/2012 1600   HGBUR LARGE (A) 04/02/2021 0040   BILIRUBINUR Negative 06/12/2021 0856   BILIRUBINUR Negative 01/29/2012 1600   KETONESUR 5 (A) 04/02/2021 0040   PROTEINUR 1+ (A) 06/12/2021 0856   PROTEINUR 100 (A) 04/02/2021 0040   NITRITE Negative 06/12/2021 0856   NITRITE NEGATIVE 04/02/2021 0040   LEUKOCYTESUR Trace (A) 06/12/2021 0856    LEUKOCYTESUR NEGATIVE 04/02/2021 0040   LEUKOCYTESUR Negative 01/29/2012 1600    Radiological Exams on Admission: DG Chest Port 1 View  Result Date: 09/14/2021 CLINICAL DATA:  Chest pain EXAM: PORTABLE CHEST 1 VIEW COMPARISON:  02/17/2021 FINDINGS: Cardiomegaly. Both lungs are clear. The visualized skeletal structures are unremarkable. IMPRESSION: Cardiomegaly without acute abnormality of the lungs in AP portable projection. Electronically Signed   By: Delanna Ahmadi M.D.   On: 09/14/2021 16:52    EKG: Independently reviewed. 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

## 2021-09-14 NOTE — ED Notes (Signed)
This RN ambulated pt to toilet and back to bed.  Pt tolerated well, NAD.

## 2021-09-14 NOTE — ED Triage Notes (Addendum)
Pt arrived by EMS complaining of chest pain that started around 11am this morning. 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 Per ems pacemaker firing on every beat intermittently

## 2021-09-14 NOTE — ED Provider Notes (Signed)
[facility] Emergency Department Provider Note   ____________________________________________    I have reviewed the triage vital signs and the nursing notes.   HISTORY  Chief Complaint Chest Pain     HPI Harry [name] is a 80 y.o. male with a history of coronary artery disease, diabetes, hypertension, paroxysmal atrial fibrillation, aortic stenosis who presents with complaints of chest discomfort.  Patient reports approximately 2 hours [place], it was quite cold outside and as he was leaving he developed chest discomfort.  He reports he took 2 nitro with little improvement.  He reports this is atypical because typically he feels better after nitro very rapidly.  Started to feel significantly better on the EMS ride here.  Currently is asymptomatic.  Review of medical records demonstrates internal pacemaker placement Dr. 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   Near syncope    Pressure injury of skin 02/20/2021   Symptomatic sinus bradycardia 02/17/2021   Dizziness 02/17/2021   Sinus pause 02/17/2021   Chronic anticoagulation 02/17/2021   BPH (benign prostatic hyperplasia) 01/21/2021   DDD (degenerative disc disease) 01/21/2021   Hypertensive retinopathy of both eyes 10/24/2018   Aortic stenosis, mild 07/06/2018   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) 12/07/2017   Systolic murmur 07/06/2017   Obstructive sleep apnea 03/05/2017   Paroxysmal atrial fibrillation (HCC) 03/05/2017   Coronary  artery disease involving native coronary artery of native heart without angina pectoris 06/13/2014   Erectile dysfunction 06/13/2014   Essential hypertension 06/13/2014   Deficiency of other specified B group vitamins 04/24/2014   Hypertriglyceridemia 04/24/2014   Obesity 04/24/2014   Family history of colon cancer 03/06/2014   Insulin dependent type 2 diabetes mellitus, controlled (HCC) 05/01/2012   Pseudophakia, both eyes 05/01/2012    Past Surgical History:  Procedure Laterality Date   PACEMAKER LEADLESS INSERTION N/A 02/20/2021   Procedure: PACEMAKER LEADLESS INSERTION;  Surgeon: Marcina Millard, MD;  Location: [facility] INVASIVE CV LAB;  Service: Cardiovascular;  Laterality: N/A;    Prior to Admission medications   Medication Sig Start Date End Date Taking? Authorizing Provider  apixaban (ELIQUIS) 5 MG TABS tablet Take 1 tablet (5 mg total) by mouth 2 (two) times daily. 02/22/21   Delfino Lovett, MD  ASPIRIN 81 PO Take 81 mg by mouth daily. 06/28/10   [provider]  insulin NPH-regular Human (70-30) 100 UNIT/ML injection Inject 40-60 Units into the skin See admin instructions. Inject 40 units in the morning and 60 units in the evening.    [provider]  metFORMIN (GLUCOPHAGE) 1000 MG tablet Take 1,000 mg by mouth 2 (two) times daily with a meal. 04/20/20   [provider]  Multiple Vitamins-Minerals (PRESERVISION/LUTEIN PO) Take 1 capsule by mouth daily.    [provider]  nitroGLYCERIN (NITROSTAT) 0.4 MG SL tablet Place 0.4 mg under the tongue every 5 (five) minutes as needed for chest pain. 11/27/17   [provider]  Omega-3 1000 MG CAPS Take 1,000 mg by mouth every  evening. 12/27/10   [provider]  pravastatin (PRAVACHOL) 20 MG tablet Take 20 mg by mouth daily. 06/29/20   [provider]  TRULICITY 1.5 0000000 SOPN Inject 1.5 mg into the skin See admin instructions. Every 10 days 10/01/20   [provider]   diltiazem (CARDIZEM CD) 240 MG 24 hr capsule Take 240 mg by mouth daily. Patient not taking: No sig reported 12/24/20 04/02/21  [provider]  lisinopril (ZESTRIL) 5 MG tablet Take 1 tablet by mouth daily. Patient not taking: No sig reported 06/29/20 04/02/21  [provider]     Allergies Atorvastatin, Bupropion, Citalopram, and Pioglitazone  No family history on file.  Social History Social History   Tobacco Use   Smoking status: Never   Smokeless tobacco: Never  Substance Use Topics   Alcohol use: No   Drug use: No    Review of Systems  Constitutional: No fever/chills Eyes: No visual changes.  ENT: No sore throat. Cardiovascular: As above Respiratory: Denies shortness of breath.  No pleurisy Gastrointestinal: No abdominal pain.  No nausea, no vomiting.   Genitourinary: Negative for dysuria. Musculoskeletal: Negative for back pain. Skin: Negative for rash. Neurological: Negative for headaches or weakness   ____________________________________________   PHYSICAL EXAM:  VITAL SIGNS: ED Triage Vitals  Enc Vitals Group     BP 09/14/21 1421 138/80     Pulse Rate 09/14/21 1421 (!) 113     Resp 09/14/21 1421 18     Temp 09/14/21 1421 98.7 F (37.1 C)     Temp Source 09/14/21 1421 Oral     SpO2 09/14/21 1417 94 %     Weight 09/14/21 1421 100.7 kg (222 lb)     Height 09/14/21 1421 1.702 m (5\' 7" )     Head Circumference --      Peak Flow --      Pain Score 09/14/21 1421 0     Pain Loc --      Pain Edu? --      Excl. in [place]? --     Constitutional: Alert and oriented. No acute distress. Pleasant and interactive  Nose: No congestion/rhinnorhea. Mouth/Throat: Mucous membranes are moist.   Neck:  Painless ROM Cardiovascular: Tachycardia, irregular hythm.   Good peripheral circulation. Respiratory: Normal respiratory effort.  No retractions. Lungs CTAB. Gastrointestinal: Soft and nontender. No distention.  No CVA tenderness.  Musculoskeletal: No  lower extremity tenderness nor edema.  Warm and well perfused Neurologic:  Normal speech and language. No gross focal neurologic deficits are appreciated.  Skin:  Skin is warm, dry and intact. No rash noted. Psychiatric: Mood and affect are normal. Speech and behavior are normal.  ____________________________________________   LABS (all labs ordered are listed, but only abnormal results are displayed)  Labs Reviewed  CBC  COMPREHENSIVE METABOLIC PANEL  TROPONIN I (HIGH SENSITIVITY)   ____________________________________________  EKG  ED ECG REPORT I, Lavonia Drafts, the attending physician, personally viewed and interpreted this ECG.  Date: 09/14/2021  Rate: 123 Rhythm: Atrial fibrillation QRS Axis: normal Intervals: Abnormal ST/T Wave abnormalities: normal Narrative Interpretation: no evidence of acute ischemia  ____________________________________________  RADIOLOGY  Chest x-ray reviewed by me, no acute abnormality ____________________________________________   PROCEDURES  Procedure(s) performed: No  Procedures   Critical Care performed: No ____________________________________________   INITIAL IMPRESSION / ASSESSMENT AND PLAN / ED COURSE  Pertinent labs & imaging results that were available during my care of the patient were reviewed by me and considered in my medical  decision making (see chart for details).   Patient presents with chest pain as detailed above, differential includes ACS, angina.  No shortness of breath or pleurisy to suggest PE  EKG is overall reassuring, pending high-sensitivity troponin x2  Pending labs    ____________________________________________   FINAL CLINICAL IMPRESSION(S) / ED DIAGNOSES  Final diagnoses:  Chest pain, unspecified type        Note:  This document was prepared using Dragon voice recognition software and may include unintentional dictation errors.    Lavonia Drafts, MD 09/14/21 [phone]

## 2021-09-14 NOTE — ED Provider Notes (Signed)
----------------------------------------- °  3:05 PM on 09/14/2021 -----------------------------------------  Blood pressure 138/80, pulse (!) 113, temperature 98.7 F (37.1 C), temperature source Oral, resp. rate 18, height 5\' 7"  (1.702 m), weight 100.7 kg, SpO2 96 %.  Assuming care from Dr. .  In short, Harry [name] is a 80 y.o. male with a chief complaint of Chest Pain .  Refer to the original H&P for additional details.  The current plan of care is to follow-up two sets of troponin for acute chest pain.  ----------------------------------------- 3:52 PM on 09/14/2021 ----------------------------------------- Patient's troponin noted to be elevated at greater than 600, although on reassessment he continues to deny any chest pain or other anginal symptoms.  Repeat EKG shows tachycardia of unclear rhythm, likely atrial fibrillation, but no significant ischemic changes noted.  Plan to discuss with cardiology, start patient on heparin drip along with diltiazem drip for rate control.  He previously received dose of aspirin with EMS.  .Critical Care Performed by: 09/16/2021, MD Authorized by: Chesley Noon, MD   Critical care provider statement:    Critical care time (minutes):  30   Critical care time was exclusive of:  Separately billable procedures and treating other patients and teaching time   Critical care was necessary to treat or prevent imminent or life-threatening deterioration of the following conditions:  Cardiac failure   Critical care was time spent personally by me on the following activities:  Development of treatment plan with patient or surrogate, discussions with consultants, evaluation of patient's response to treatment, examination of patient, ordering and review of laboratory studies, ordering and review of radiographic studies, ordering and performing treatments and interventions, pulse oximetry, re-evaluation of patient's condition and review of old  charts   I assumed direction of critical care for this patient from another provider in my specialty: no     Care discussed with: admitting provider       Chesley Noon, MD 09/14/21 [phone]

## 2021-09-14 NOTE — Consult Note (Signed)
ANTICOAGULATION CONSULT NOTE - Initial Consult  Pharmacy Consult for heparin Indication: chest pain/ACS  Allergies  Allergen Reactions   Atorvastatin     Other reaction(s): Muscle Pain   Bupropion Other (See Comments)    Other reaction(s): Dizziness CNS side effects   Citalopram     Other reaction(s): Other (See Comments) GI side effects   Pioglitazone     Other reaction(s): Muscle Pain    Patient Measurements: Height: 5\' 7"  (170.2 cm) Weight: 100.7 kg (222 lb) IBW/kg (Calculated) : 66.1 Heparin Dosing Weight: 88 kg  Vital Signs: Temp: 98.7 F (37.1 C) (12/24 1421) Temp Source: Oral (12/24 1421) BP: 138/80 (12/24 1421) Pulse Rate: 113 (12/24 1421)  Labs: Recent Labs    09/14/21 1434  HGB 10.8*  HCT 34.7*  PLT 292  CREATININE 0.90  TROPONINIHS 611*    Estimated Creatinine Clearance: 76.4 mL/min (by C-G formula based on SCr of 0.9 mg/dL).   Medical History: Past Medical History:  Diagnosis Date   Aortic stenosis    CAD (coronary artery disease)    Diabetes (HCC)    HTN (hypertension)    PAF (paroxysmal atrial fibrillation) (HCC)     Medications:  On Eliquis 5 mg BID, last dose 12/24 @ 0900   Assessment: 80 y.o. male with a history of coronary artery disease, diabetes, hypertension, paroxysmal atrial fibrillation, aortic stenosis who presents with complaints of chest discomfort.  Troponin elevated to 611. Pharmacy has been consulted for heparin dosing for ACS.   Baseline labs: Hgb 10.8, plts 292, aPTT 36 and heparin level >1.10   Goal of Therapy:  Heparin level 0.3-0.7 units/ml aPTT 66-102 seconds Monitor platelets by anticoagulation protocol: Yes   Plan:  Start heparin infusion at 1150 units/hr without bolus since last dose of Eliquis was this morning Check aPTT in 8 hours and daily while on heparin, will monitor with aPTT until heparin level correlation due to recent DOAC use Continue to monitor H&H and platelets  70,  PharmD 09/14/2021,3:44 PM

## 2021-09-15 ENCOUNTER — Inpatient Hospital Stay
Admit: 2021-09-15 | Discharge: 2021-09-15 | Disposition: A | Discharge status: Home / Self Care | Payer: Medicare HMO | Attending: Internal Medicine | Admitting: Internal Medicine

## 2021-09-15 DIAGNOSIS — I214 Non-ST elevation (NSTEMI) myocardial infarction: Secondary | ICD-10-CM | POA: Diagnosis not present

## 2021-09-15 LAB — BASIC METABOLIC PANEL
Anion gap: 10 (ref 5–15)
BUN: 17 mg/dL (ref 8–23)
CO2: 23 mmol/L (ref 22–32)
Calcium: 8.7 mg/dL — ABNORMAL LOW (ref 8.9–10.3)
Chloride: 102 mmol/L (ref 98–111)
Creatinine, Ser: 1.01 mg/dL (ref 0.61–1.24)
GFR, Estimated: >60 mL/min (ref >60)
Glucose, Bld: 132 mg/dL — ABNORMAL HIGH (ref 70–99)
Potassium: 4 mmol/L (ref 3.5–5.1)
Sodium: 135 mmol/L (ref 135–145)

## 2021-09-15 LAB — CBC
HCT: 33.7 % — ABNORMAL LOW (ref 39.0–52.0)
Hemoglobin: 10.6 g/dL — ABNORMAL LOW (ref 13.0–17.0)
MCH: 23.5 pg — ABNORMAL LOW (ref 26.0–34.0)
MCHC: 31.5 g/dL (ref 30.0–36.0)
MCV: 74.6 fL — ABNORMAL LOW (ref 80.0–100.0)
Platelets: 323 10*3/uL (ref 150–400)
RBC: 4.52 MIL/uL (ref 4.22–5.81)
RDW: 17.5 % — ABNORMAL HIGH (ref 11.5–15.5)
WBC: 13.6 10*3/uL — ABNORMAL HIGH (ref 4.0–10.5)
nRBC: 0 % (ref 0.0–0.2)

## 2021-09-15 LAB — CBG MONITORING, ED
Glucose-Capillary: 137 mg/dL — ABNORMAL HIGH (ref 70–99)
Glucose-Capillary: 154 mg/dL — ABNORMAL HIGH (ref 70–99)
Glucose-Capillary: 154 mg/dL — ABNORMAL HIGH (ref 70–99)
Glucose-Capillary: 164 mg/dL — ABNORMAL HIGH (ref 70–99)
Glucose-Capillary: 166 mg/dL — ABNORMAL HIGH (ref 70–99)

## 2021-09-15 LAB — LIPID PANEL
Cholesterol: 113 mg/dL (ref 0–200)
HDL: 28 mg/dL — ABNORMAL LOW (ref >40)
LDL Cholesterol: 43 mg/dL (ref 0–99)
Total CHOL/HDL Ratio: 4 RATIO
Triglycerides: 210 mg/dL — ABNORMAL HIGH (ref <150)
VLDL: 42 mg/dL — ABNORMAL HIGH (ref 0–40)

## 2021-09-15 LAB — APTT
aPTT: 60 seconds — ABNORMAL HIGH (ref 24–36)
aPTT: 64 seconds — ABNORMAL HIGH (ref 24–36)

## 2021-09-15 LAB — PROTIME-INR
INR: 1.2 (ref 0.8–1.2)
Prothrombin Time: 15.7 seconds — ABNORMAL HIGH (ref 11.4–15.2)

## 2021-09-15 LAB — GLUCOSE, CAPILLARY: Glucose-Capillary: 184 mg/dL — ABNORMAL HIGH (ref 70–99)

## 2021-09-15 LAB — TROPONIN I (HIGH SENSITIVITY): Troponin I (High Sensitivity): >24000 ng/L (ref <18)

## 2021-09-15 MED ORDER — METOPROLOL TARTRATE 25 MG PO TABS
25.0000 mg | ORAL_TABLET | Freq: Two times a day (BID) | ORAL | Status: DC
  Administered 2021-09-15 – 2021-09-16 (×3): 25 mg via ORAL
  Filled 2021-09-15 (×3): qty 1

## 2021-09-15 MED ORDER — DILTIAZEM HCL 30 MG PO TABS
30.0000 mg | ORAL_TABLET | Freq: Four times a day (QID) | ORAL | Status: DC
  Administered 2021-09-16 – 2021-09-17 (×8): 30 mg via ORAL
  Filled 2021-09-15 (×8): qty 1

## 2021-09-15 MED ORDER — SODIUM CHLORIDE 0.9% FLUSH
3.0000 mL | Freq: Two times a day (BID) | INTRAVENOUS | Status: DC
  Administered 2021-09-15 – 2021-09-16 (×4): 3 mL via INTRAVENOUS

## 2021-09-15 MED ORDER — PERFLUTREN LIPID MICROSPHERE
1.0000 mL | INTRAVENOUS | Status: AC | PRN
Start: 2021-09-15 — End: 2021-09-15
  Administered 2021-09-15: 15:00:00 4 mL via INTRAVENOUS
  Filled 2021-09-15: qty 10

## 2021-09-15 MED ORDER — HEPARIN BOLUS VIA INFUSION
1300.0000 [IU] | Freq: Once | INTRAVENOUS | Status: AC
  Administered 2021-09-15: 08:00:00 1300 [IU] via INTRAVENOUS
  Filled 2021-09-15: qty 1300

## 2021-09-15 MED ORDER — INSULIN ASPART 100 UNIT/ML IJ SOLN
0.0000 [IU] | Freq: Three times a day (TID) | INTRAMUSCULAR | Status: DC
  Administered 2021-09-16 (×3): 2 [IU] via SUBCUTANEOUS
  Administered 2021-09-17: 18:00:00 3 [IU] via SUBCUTANEOUS
  Filled 2021-09-15 (×4): qty 1

## 2021-09-15 MED ORDER — INSULIN ASPART 100 UNIT/ML IJ SOLN: 0.0000 [IU] | Freq: Every day | INTRAMUSCULAR | Status: DC

## 2021-09-15 MED ORDER — SODIUM CHLORIDE 0.9 % IV BOLUS
500.0000 mL | Freq: Once | INTRAVENOUS | Status: AC
  Administered 2021-09-15: 12:00:00 500 mL via INTRAVENOUS

## 2021-09-15 MED ORDER — HEPARIN BOLUS VIA INFUSION
1300.0000 [IU] | Freq: Once | INTRAVENOUS | Status: AC
  Administered 2021-09-15: 16:00:00 1300 [IU] via INTRAVENOUS
  Filled 2021-09-15: qty 1300

## 2021-09-15 NOTE — ED Notes (Signed)
Face to face order for cardizem to go to 10 per Dr Georgeann Oppenheim

## 2021-09-15 NOTE — Progress Notes (Signed)
*  PRELIMINARY RESULTS* Echocardiogram 2D Echocardiogram has been performed. Definity IV Contrast used on this study.  Harry [name] 09/15/2021, 3:07 PM

## 2021-09-15 NOTE — ED Notes (Signed)
Dr Georgeann Oppenheim messaged via secure chat to notify of HR 130s with BP 104/80. Cardizem continues to be at 5, will not increase d/t BP at this time

## 2021-09-15 NOTE — Consult Note (Signed)
CARDIOLOGY CONSULT NOTE               Patient ID: Harry [name] MRN: [id number] DOB/AGE: 03-25-1941 80 y.o.  Admit date: 09/14/2021 Referring Physician Dr. Luvenia Redden hospitalist Primary Physician Dr. Derinda Late primary Primary Cardiologist Dr Saralyn Pilar Reason for Consultation non-STEMI unstable angina  HPI: Patient is a 80 year old male history of multiple medical problems including known coronary disease PCI and stent in the distant about 10 years ago patient has history of diabetes paroxysmal A. 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  Patient reported history of tobacco abuse was in normal state of health started having chest pain the day of admission relatively severe midsternal got progressively worse took 2 nitros finally came in for evaluation initial troponins are slightly elevated chest pain improved he appeared to be in rapid atrial fibrillation as well and that was being controlled with diltiazem he was switched to heparin for anticoagulation and Eliquis was held.  By the time I saw the patient was reasonably comfortable heart rate was much improved and troponins jumped from 600 to over 3000.  Patient is being admitted for non-STEMI requiring further cardiac evaluation  Review of systems complete and found to be negative unless listed above     Past Medical History:  Diagnosis Date   Aortic stenosis    CAD (coronary artery disease)    Diabetes (HCC)    HTN (hypertension)    PAF (paroxysmal atrial fibrillation) (HCC)     Past Surgical History:  Procedure Laterality Date   PACEMAKER LEADLESS INSERTION N/A 02/20/2021   Procedure: PACEMAKER LEADLESS INSERTION;  Surgeon: Isaias Cowman, MD;  Location: [facility];  Service: Cardiovascular;  Laterality: N/A;    (Not in a hospital admission)  Social History   Socioeconomic  History   Marital status: Married    Spouse name: Not on file   Number of children: Not on file   Years of education: Not on file   Highest education level: Not on file  Occupational History   Not on file  Tobacco Use   Smoking status: Never   Smokeless tobacco: Never  Substance and Sexual Activity   Alcohol use: No   Drug use: No   Sexual activity: Not on file  Other Topics Concern   Not on file  Social History Narrative   Not on file   Social Determinants of Health   Financial Resource Strain: Not on file  Food Insecurity: Not on file  Transportation Needs: Not on file  Physical Activity: Not on file  Stress: Not on file  Social Connections: Not on file  Intimate Partner Violence: Not on file    No family history on file.    Review of systems complete and found to be negative unless listed above      PHYSICAL EXAM  General: Well developed, well nourished, in no acute distress HEENT:  Normocephalic and atramatic Neck:  No JVD.  Lungs: Clear bilaterally to auscultation and percussion. Heart: HRRR . Normal S1 and S2 without gallops or murmurs.  Abdomen: Bowel sounds are positive, abdomen soft and non-tender  Msk:  Back normal, normal gait. Normal strength and tone for age. Extremities: No clubbing, cyanosis or edema.   Neuro: Alert and oriented X 3. Psych:  Good affect, responds appropriately  Labs:   Lab Results  Component Value Date   WBC 13.6 (H)  09/15/2021   HGB 10.6 (L) 09/15/2021   HCT 33.7 (L) 09/15/2021   MCV 74.6 (L) 09/15/2021   PLT 323 09/15/2021    Recent Labs  Lab 09/14/21 1434 09/15/21 0619  NA 135 135  K 4.3 4.0  CL 101 102  CO2 25 23  BUN 15 17  CREATININE 0.90 1.01  CALCIUM 9.0 8.7*  PROT 7.4  --   BILITOT 0.7  --   ALKPHOS 68  --   ALT 17  --   AST 35  --   GLUCOSE 113* 132*   Lab Results  Component Value Date   CKTOTAL 93 01/29/2012   CKMB 0.7 01/29/2012   TROPONINI <0.03 09/03/2016    Lab Results  Component Value  Date   CHOL 113 09/15/2021   CHOL 118 04/02/2021   Lab Results  Component Value Date   HDL 28 (L) 09/15/2021   HDL 17 (L) 04/02/2021   Lab Results  Component Value Date   LDLCALC 43 09/15/2021   LDLCALC 50 04/02/2021   Lab Results  Component Value Date   TRIG 210 (H) 09/15/2021   TRIG 253 (H) 04/02/2021   Lab Results  Component Value Date   CHOLHDL 4.0 09/15/2021   CHOLHDL 6.9 04/02/2021   No results found for: LDLDIRECT    Radiology: [facility] Chest Port 1 View  Result Date: 09/14/2021 CLINICAL DATA:  Chest pain EXAM: PORTABLE CHEST 1 VIEW COMPARISON:  02/17/2021 FINDINGS: Cardiomegaly. Both lungs are clear. The visualized skeletal structures are unremarkable. IMPRESSION: Cardiomegaly without acute abnormality of the lungs in AP portable projection. Electronically Signed   By: Jearld Lesch M.D.   On: 09/14/2021 16:52    EKG: Atrial fibrillation with occasional V paced complex rapid ventricular response at 120 nonspecific ST-T wave changes  ASSESSMENT AND PLAN:  Unstable angina Possible non-STEMI Known coronary artery disease PCI and stent in the distant past Atrial fibrillation Obesity Smoking Dyslipidemia Sick sinus syndrome Status post permanent pacemaker placement Probable obstructive sleep apnea Diabetes History of hematuria . Plan Agreed admit to telemetry Follow-up EKGs and troponins Echocardiogram for evaluation of valvular structures left ventricular function Atrial fibrillation rapid ventricular response well controlled rate with Cardizem consider starting beta-blockade therapy continue anticoagulation with heparin hold Eliquis for now Switch from Eliquis in favor of IV heparin for anticoagulation Continue statin therapy for hyperlipidemia Continue diabetes management and control currently on NPH insulin metformin  Trulicity Hypertension management with lisinopril diltiazem Advised patient refrain from tobacco abuse Maintain diltiazem for rate control for  A. fib consider adding beta-blocker Recommend weight loss exercise portion control Sleep study CPAP if indicated weight loss obstructive sleep apnea Recommend cardiac cath prior to discharge Will alert Dr. Darrold Junker hopefully patient will be able to have AP do his case at his request  Signed: 09/15/2021, 7:55 AM

## 2021-09-15 NOTE — Progress Notes (Signed)
[facility] Cardiology    SUBJECTIVE: Patient states he feels reasonably well no further pain no worsening shortness of breath resting comfortably agrees and understand about cardiac cath on Tuesday   Vitals:   09/15/21 0952 09/15/21 1120 09/15/21 1230 09/15/21 1245  BP: (!) 91/59 104/80 113/73   Pulse: (!) 101 (!) 133 (!) 137 (!) 136  Resp: 14  15 12   Temp:      TempSrc:      SpO2: 94% 94% 94% 95%  Weight:      Height:        No intake or output data in the 24 hours ending 09/15/21 1309    PHYSICAL EXAM  General: Well developed, well nourished, in no acute distress HEENT:  Normocephalic and atramatic Neck:  No JVD.  Lungs: Clear bilaterally to auscultation and percussion. Heart: HRRR . Normal S1 and S2 without gallops or murmurs.  Abdomen: Bowel sounds are positive, abdomen soft and non-tender  Msk:  Back normal, normal gait. Normal strength and tone for age. Extremities: No clubbing, cyanosis or edema.   Neuro: Alert and oriented X 3. Psych:  Good affect, responds appropriately   LABS: Basic Metabolic Panel: Recent Labs    09/14/21 1434 09/15/21 0619  NA 135 135  K 4.3 4.0  CL 101 102  CO2 25 23  GLUCOSE 113* 132*  BUN 15 17  CREATININE 0.90 1.01  CALCIUM 9.0 8.7*   Liver Function Tests: Recent Labs    09/14/21 1434  AST 35  ALT 17  ALKPHOS 68  BILITOT 0.7  PROT 7.4  ALBUMIN 3.7   No results for input(s): LIPASE, AMYLASE in the last 72 hours. CBC: Recent Labs    09/14/21 1434 09/15/21 0619  WBC 11.3* 13.6*  HGB 10.8* 10.6*  HCT 34.7* 33.7*  MCV 76.3* 74.6*  PLT 292 323   Cardiac Enzymes: No results for input(s): CKTOTAL, CKMB, CKMBINDEX, TROPONINI in the last 72 hours. BNP: Invalid input(s): POCBNP D-Dimer: No results for input(s): DDIMER in the last 72 hours. Hemoglobin A1C: No results for input(s): HGBA1C in the last 72 hours. Fasting Lipid Panel: Recent Labs    09/15/21 0619  CHOL 113  HDL 28*  LDLCALC 43  TRIG 09/17/21*  CHOLHDL 4.0    Thyroid Function Tests: Recent Labs    09/14/21 1857  TSH 2.782   Anemia Panel: No results for input(s): VITAMINB12, FOLATE, FERRITIN, TIBC, IRON, RETICCTPCT in the last 72 hours.  DG Chest Port 1 View  Result Date: 09/14/2021 CLINICAL DATA:  Chest pain EXAM: PORTABLE CHEST 1 VIEW COMPARISON:  02/17/2021 FINDINGS: Cardiomegaly. Both lungs are clear. The visualized skeletal structures are unremarkable. IMPRESSION: Cardiomegaly without acute abnormality of the lungs in AP portable projection. Electronically Signed   By: 02/19/2021 M.D.   On: 09/14/2021 16:52     Echo EF 40-45%  TELEMETRY: paced:  ASSESSMENT AND PLAN:  Principal Problem:   NSTEMI (non-ST elevated myocardial infarction) (HCC) Active Problems:   Atrial fibrillation with RVR (HCC) Unstable angina Obesity Diabetes type 2 Sick sinus syndrome status post permanent pacemaker Obstructive sleep apnea  Plan Agree with admit to telemetry follow-up EKGs troponins Continue anticoagulation with heparin for non-STEMI Continue blood pressure control Aspirin 81 mg daily Hold Eliquis for now until invasive procedures completed Recommend diltiazem drip transition to p.o. Continue metoprolol therapy Agree with diabetes management and control Sleep study CPAP if indicated weight loss Permanent pacemaker in place appears to be functioning adequately Recommend cardiac cath on Tuesday  Alwyn Pea, MD 09/15/2021 1:09 PM

## 2021-09-15 NOTE — Consult Note (Signed)
ANTICOAGULATION CONSULT NOTE  Pharmacy Consult for heparin Indication: chest pain/ACS  Allergies  Allergen Reactions   Atorvastatin     Other reaction(s): Muscle Pain   Bupropion Other (See Comments)    Other reaction(s): Dizziness CNS side effects   Citalopram     Other reaction(s): Other (See Comments) GI side effects   Pioglitazone     Other reaction(s): Muscle Pain    Patient Measurements: Height: 5\' 7"  (170.2 cm) Weight: 100.7 kg (222 lb) IBW/kg (Calculated) : 66.1 Heparin Dosing Weight: 88 kg  Vital Signs: BP: 103/74 (12/25 1400) Pulse Rate: 71 (12/25 1415)  Labs: Recent Labs    09/14/21 1434 09/14/21 1629 09/14/21 1857 09/15/21 0316 09/15/21 0619 09/15/21 1531  HGB 10.8*  --   --   --  10.6*  --   HCT 34.7*  --   --   --  33.7*  --   PLT 292  --   --   --  323  --   APTT  --  36  --  60*  --  64*  LABPROT  --   --   --   --  15.7*  --   INR  --   --   --   --  1.2  --   HEPARINUNFRC  --  >1.10*  --   --   --   --   CREATININE 0.90  --   --   --  1.01  --   TROPONINIHS 611* 3,093* 12,813* >24,000*  --   --      Estimated Creatinine Clearance: 68.1 mL/min (by C-G formula based on SCr of 1.01 mg/dL).   Medical History: Past Medical History:  Diagnosis Date   Aortic stenosis    CAD (coronary artery disease)    Diabetes (HCC)    HTN (hypertension)    PAF (paroxysmal atrial fibrillation) (HCC)     Medications:  On Eliquis 5 mg BID, last dose 12/24 @ 0900   Assessment: 80 y.o. male with a history of coronary artery disease, diabetes, hypertension, paroxysmal atrial fibrillation, aortic stenosis who presents with complaints of chest discomfort. Troponin elevated to 611. Pharmacy has been consulted for heparin dosing for ACS.   Baseline labs: Hgb 10.8, plts 292, aPTT 36 and heparin level >1.10   Date Time aPTT/HL Rate/comment  1224 0316 aPTT 60 1150 un/hr, subtherapeutic  1224 1531 aPTT 64 1300 un/hr, subtherapeutic   Goal of Therapy:  Heparin  level 0.3-0.7 units/ml aPTT 66-102 seconds Monitor platelets by anticoagulation protocol: Yes   Plan:  aPTT 64, subtherapeutic Heparin 1300 unit bolus x 1 and increase heparin infusion to 1450 units/hr Recheck aPTT 8 hours after rate change, will monitor with aPTT until heparin level correlation due to recent DOAC use Continue to monitor H&H and platelets  70, PharmD Clinical Pharmacist 09/15/2021 4:12 PM

## 2021-09-15 NOTE — ED Notes (Signed)
Per Dr Georgeann Oppenheim, give 500 bolus then titrate cardizem drip

## 2021-09-15 NOTE — ED Notes (Signed)
ED TO INPATIENT HANDOFF REPORT  ED Nurse Name and Phone #: [phone]  S Name/Age/Gender Harry [name] 80 y.o. male Room/Bed: ED09A/ED09A  Code Status   Code Status: Full Code  Home/SNF/Other Home Patient oriented to: self, place, time, and situation Is this baseline? Yes   Triage Complete: Triage complete  Chief Complaint NSTEMI (non-ST elevated myocardial infarction) (HCC) [I21.4] Atrial fibrillation with RVR (HCC) [I48.91]  Triage Note Pt arrived by EMS complaining of chest pain that started around 11am this morning. 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 Per ems pacemaker firing on every beat intermittently      Allergies Allergies  Allergen Reactions   Atorvastatin     Other reaction(s): Muscle Pain   Bupropion Other (See Comments)    Other reaction(s): Dizziness CNS side effects   Citalopram     Other reaction(s): Other (See Comments) GI side effects   Pioglitazone     Other reaction(s): Muscle Pain    Level of Care/Admitting Diagnosis ED Disposition     ED Disposition  Admit   Condition  --   Comment  Hospital Area: [facility] [[id number]]  Level of Care: Progressive [102]  Admit to Progressive based on following criteria: CARDIOVASCULAR & THORACIC of moderate stability with acute coronary syndrome symptoms/low risk myocardial infarction/hypertensive urgency/arrhythmias/heart failure potentially compromising stability and stable post cardiovascular intervention patients.  Covid Evaluation: Confirmed COVID Negative  Diagnosis: Atrial fibrillation with RVR [facility]) [[id number]]  Admitting Physician: Lurline Del [[id number]]  Attending Physician: Lurline Del [[id number]]  Estimated length of stay: 3 - 4 days  Certification:: I  certify this patient will need inpatient services for at least 2 midnights          B Medical/Surgery History Past Medical History:  Diagnosis Date   Aortic stenosis    CAD (coronary artery disease)    Diabetes (HCC)    HTN (hypertension)    PAF (paroxysmal atrial fibrillation) (HCC)    Past Surgical History:  Procedure Laterality Date   PACEMAKER LEADLESS INSERTION N/A 02/20/2021   Procedure: PACEMAKER LEADLESS INSERTION;  Surgeon: Marcina Millard, MD;  Location: [facility] INVASIVE CV LAB;  Service: Cardiovascular;  Laterality: N/A;     A IV Location/Drains/Wounds Patient Lines/Drains/Airways Status     Active Line/Drains/Airways     Name Placement date Placement time Site Days   Peripheral IV 09/14/21 20 G Anterior;Distal;Right;Upper Arm 09/14/21  1630  Arm  1   Peripheral IV 09/15/21 Left;Posterior Hand 09/15/21  0550  Hand  less than 1   Peripheral IV 09/15/21 22 G Anterior;Right Forearm 09/15/21  1747  Forearm  less than 1   Pressure Injury 02/18/21 Sacrum Mid;Bilateral Stage 1 -  Intact skin with non-blanchable redness of a localized area usually over a bony prominence. red and boggy 02/18/21  1100  -- 209            Intake/Output Last 24 hours  Intake/Output Summary (Last 24 hours) at 09/15/2021 1915 Last data filed at 09/15/2021 1731 Gross per 24 hour  Intake --  Output 300 ml  Net -300 ml    Labs/Imaging Results for orders placed or performed during the hospital encounter of 09/14/21 (from the past 48 hour(s))  CBC  Status: Abnormal   Collection Time: 09/14/21  2:34 PM  Result Value Ref Range   WBC 11.3 (H) 4.0 - 10.5 K/uL   RBC 4.55 4.22 - 5.81 MIL/uL   Hemoglobin 10.8 (L) 13.0 - 17.0 g/dL   HCT 19.1 (L) 47.8 - 29.5 %   MCV 76.3 (L) 80.0 - 100.0 fL   MCH 23.7 (L) 26.0 - 34.0 pg   MCHC 31.1 30.0 - 36.0 g/dL   RDW 62.1 (H) 30.8 - 65.7 %   Platelets 292 150 - 400 K/uL   nRBC 0.0 0.0 - 0.2 %    Comment: Performed at [facility], [address], [place], [place] [address]  Comprehensive metabolic panel     Status: Abnormal   Collection Time: 09/14/21  2:34 PM  Result Value Ref Range   Sodium 135 135 - 145 mmol/L   Potassium 4.3 3.5 - 5.1 mmol/L   Chloride 101 98 - 111 mmol/L   CO2 25 22 - 32 mmol/L   Glucose, Bld 113 (H) 70 - 99 mg/dL    Comment: Glucose reference range applies only to samples taken after fasting for at least 8 hours.   BUN 15 8 - 23 mg/dL   Creatinine, Ser 2.95 0.61 - 1.24 mg/dL   Calcium 9.0 8.9 - 28.4 mg/dL   Total Protein 7.4 6.5 - 8.1 g/dL   Albumin 3.7 3.5 - 5.0 g/dL   AST 35 15 - 41 U/L   ALT 17 0 - 44 U/L   Alkaline Phosphatase 68 38 - 126 U/L   Total Bilirubin 0.7 0.3 - 1.2 mg/dL   GFR, Estimated >13 >24 mL/min    Comment: (NOTE) Calculated using the CKD-EPI Creatinine Equation (2021)    Anion gap 9 5 - 15    Comment: Performed at [facility], [address] [place], [place] [address]  Troponin I (High Sensitivity)     Status: Abnormal   Collection Time: 09/14/21  2:34 PM  Result Value Ref Range   Troponin I (High Sensitivity) 611 (HH) <18 ng/L    Comment: CRITICAL RESULT CALLED TO, READ BACK BY AND VERIFIED WITH JACQUE DODD AT 7253 09/14/21.PMF (NOTE) Elevated high sensitivity troponin I (hsTnI) values and significant  changes across serial measurements may suggest ACS but many other  chronic and acute conditions are known to elevate hsTnI results.  Refer to the "Links" section for chest pain algorithms and additional  guidance. Performed at [facility], [address] [place], [place] [address]   APTT     Status: None   Collection Time: 09/14/21  4:29 PM  Result Value Ref Range   aPTT 36 24 - 36 seconds    Comment: Performed at [facility], [address] [place], [place] [address]  Heparin level (unfractionated)     Status: Abnormal   Collection Time: 09/14/21  4:29 PM  Result Value Ref Range   Heparin Unfractionated >1.10 (H) 0.30 -  0.70 IU/mL    Comment: (NOTE) The clinical reportable range upper limit is being lowered to >1.10 to align with the FDA approved guidance for the current laboratory assay.  If heparin results are below expected values, and patient dosage has  been confirmed, suggest follow up testing of antithrombin III levels. Performed at [facility], [address], [place], [place] [address]   Troponin I (High Sensitivity)     Status: Abnormal   Collection Time: 09/14/21  4:29 PM  Result Value Ref  Range   Troponin I (High Sensitivity) 3,093 (HH) <18 ng/L    Comment: CRITICAL VALUE NOTED. VALUE IS CONSISTENT WITH PREVIOUSLY REPORTED/CALLED VALUE. QSD (NOTE) Elevated high sensitivity troponin I (hsTnI) values and significant  changes across serial measurements may suggest ACS but many other  chronic and acute conditions are known to elevate hsTnI results.  Refer to the "Links" section for chest pain algorithms and additional  guidance. Performed at [facility], [address] [place], [place] [address]   Resp Panel by RT-PCR (Flu A&B, Covid) Nasopharyngeal Swab     Status: None   Collection Time: 09/14/21  6:56 PM   Specimen: Nasopharyngeal Swab; Nasopharyngeal(NP) swabs in vial transport medium  Result Value Ref Range   SARS Coronavirus 2 by RT PCR NEGATIVE NEGATIVE    Comment: (NOTE) SARS-CoV-2 target nucleic acids are NOT DETECTED.  The SARS-CoV-2 RNA is generally detectable in upper respiratory specimens during the acute phase of infection. The lowest concentration of SARS-CoV-2 viral copies this assay can detect is 138 copies/mL. A negative result does not preclude SARS-Cov-2 infection and should not be used as the sole basis for treatment or other patient management decisions. A negative result may occur with  improper specimen collection/handling, submission of specimen other than nasopharyngeal swab, presence of viral mutation(s) within the areas targeted by  this assay, and inadequate number of viral copies(<138 copies/mL). A negative result must be combined with clinical observations, patient history, and epidemiological information. The expected result is Negative.  Fact Sheet for Patients:  BloggerCourse.com  Fact Sheet for Healthcare Providers:  SeriousBroker.it  This test is no t yet approved or cleared by the [place] FDA and  has been authorized for detection and/or diagnosis of SARS-CoV-2 by FDA under an Emergency Use Authorization (EUA). This EUA will remain  in effect (meaning this test can be used) for the duration of the COVID-19 declaration under Section 564(b)(1) of the Act, 21 U.S.C.section 360bbb-3(b)(1), unless the authorization is terminated  or revoked sooner.       Influenza A by PCR NEGATIVE NEGATIVE   Influenza B by PCR NEGATIVE NEGATIVE    Comment: (NOTE) The Xpert Xpress SARS-CoV-2/FLU/RSV plus assay is intended as an aid in the diagnosis of influenza from Nasopharyngeal swab specimens and should not be used as a sole basis for treatment. Nasal washings and aspirates are unacceptable for Xpert Xpress SARS-CoV-2/FLU/RSV testing.  Fact Sheet for Patients: BloggerCourse.com  Fact Sheet for Healthcare Providers: SeriousBroker.it  This test is not yet approved or cleared by the [place] FDA and has been authorized for detection and/or diagnosis of SARS-CoV-2 by FDA under an Emergency Use Authorization (EUA). This EUA will remain in effect (meaning this test can be used) for the duration of the COVID-19 declaration under Section 564(b)(1) of the Act, 21 U.S.C. section 360bbb-3(b)(1), unless the authorization is terminated or revoked.  Performed at [facility], [address] [place], [place] [address]   Brain natriuretic peptide     Status: Abnormal   Collection Time: 09/14/21  6:56 PM   Result Value Ref Range   B Natriuretic Peptide 178.3 (H) 0.0 - 100.0 pg/mL    Comment: Performed at [facility], [address] [place], [place] [address]  TSH     Status: None   Collection Time: 09/14/21  6:57 PM  Result Value Ref Range   TSH 2.782 0.350 - 4.500 uIU/mL    Comment: Performed by a 3rd Generation assay with a functional sensitivity  of <=0.01 uIU/mL. Performed at [facility], [address] [place], [place] [address]   Troponin I (High Sensitivity)     Status: Abnormal   Collection Time: 09/14/21  6:57 PM  Result Value Ref Range   Troponin I (High Sensitivity) 12,813 (HH) <18 ng/L    Comment: CRITICAL VALUE NOTED. VALUE IS CONSISTENT WITH PREVIOUSLY REPORTED/CALLED VALUE GA (NOTE) Elevated high sensitivity troponin I (hsTnI) values and significant  changes across serial measurements may suggest ACS but many other  chronic and acute conditions are known to elevate hsTnI results.  Refer to the "Links" section for chest pain algorithms and additional  guidance. Performed at [facility], [address] [place], [place] [address]   CBG monitoring, ED     Status: Abnormal   Collection Time: 09/15/21 12:26 AM  Result Value Ref Range   Glucose-Capillary 137 (H) 70 - 99 mg/dL    Comment: Glucose reference range applies only to samples taken after fasting for at least 8 hours.  APTT     Status: Abnormal   Collection Time: 09/15/21  3:16 AM  Result Value Ref Range   aPTT 60 (H) 24 - 36 seconds    Comment:        IF BASELINE aPTT IS ELEVATED, SUGGEST PATIENT RISK ASSESSMENT BE USED TO DETERMINE APPROPRIATE ANTICOAGULANT THERAPY. Performed at [facility], [address] [place], [place] [address]   Troponin I (High Sensitivity)     Status: Abnormal   Collection Time: 09/15/21  3:16 AM  Result Value Ref Range   Troponin I (High Sensitivity) >24,000 (HH) <18 ng/L    Comment: RESULT CONFIRMED BY MANUAL DILUTION DLB CRITICAL  VALUE NOTED. VALUE IS CONSISTENT WITH PREVIOUSLY REPORTED/CALLED VALUE DLB (NOTE) Elevated high sensitivity troponin I (hsTnI) values and significant  changes across serial measurements may suggest ACS but many other  chronic and acute conditions are known to elevate hsTnI results.  Refer to the "Links" section for chest pain algorithms and additional  guidance. Performed at [facility], [address] [place], [place] [address]   CBG monitoring, ED     Status: Abnormal   Collection Time: 09/15/21  4:09 AM  Result Value Ref Range   Glucose-Capillary 154 (H) 70 - 99 mg/dL    Comment: Glucose reference range applies only to samples taken after fasting for at least 8 hours.  Lipid panel     Status: Abnormal   Collection Time: 09/15/21  6:19 AM  Result Value Ref Range   Cholesterol 113 0 - 200 mg/dL   Triglycerides 536 (H) <150 mg/dL   HDL 28 (L) >64 mg/dL   Total CHOL/HDL Ratio 4.0 RATIO   VLDL 42 (H) 0 - 40 mg/dL   LDL Cholesterol 43 0 - 99 mg/dL    Comment:        Total Cholesterol/HDL:CHD Risk Coronary Heart Disease Risk Table                     Men   Women  1/2 Average Risk   3.4   3.3  Average Risk       5.0   4.4  2 X Average Risk   9.6   7.1  3 X Average Risk  23.4   11.0        Use the calculated Patient Ratio above and the CHD Risk Table to determine the patient's CHD Risk.        ATP III CLASSIFICATION (  LDL):  <100     mg/dL   Optimal  295-188  mg/dL   Near or Above                    Optimal  130-159  mg/dL   Borderline  416-606  mg/dL   High  >301     mg/dL   Very High Performed at [facility], [address] [place], [place] [address]   CBC     Status: Abnormal   Collection Time: 09/15/21  6:19 AM  Result Value Ref Range   WBC 13.6 (H) 4.0 - 10.5 K/uL   RBC 4.52 4.22 - 5.81 MIL/uL   Hemoglobin 10.6 (L) 13.0 - 17.0 g/dL   HCT 32.3 (L) 55.7 - 32.2 %   MCV 74.6 (L) 80.0 - 100.0 fL   MCH 23.5 (L) 26.0 - 34.0 pg   MCHC 31.5 30.0 -  36.0 g/dL   RDW 02.5 (H) 42.7 - 06.2 %   Platelets 323 150 - 400 K/uL   nRBC 0.0 0.0 - 0.2 %    Comment: Performed at [facility], [address], [place], [place] [address]  Basic metabolic panel     Status: Abnormal   Collection Time: 09/15/21  6:19 AM  Result Value Ref Range   Sodium 135 135 - 145 mmol/L   Potassium 4.0 3.5 - 5.1 mmol/L   Chloride 102 98 - 111 mmol/L   CO2 23 22 - 32 mmol/L   Glucose, Bld 132 (H) 70 - 99 mg/dL    Comment: Glucose reference range applies only to samples taken after fasting for at least 8 hours.   BUN 17 8 - 23 mg/dL   Creatinine, Ser 3.15 0.61 - 1.24 mg/dL   Calcium 8.7 (L) 8.9 - 10.3 mg/dL   GFR, Estimated >17 >61 mL/min    Comment: (NOTE) Calculated using the CKD-EPI Creatinine Equation (2021)    Anion gap 10 5 - 15    Comment: Performed at [facility], [address] [place], [place] [address]  Protime-INR     Status: Abnormal   Collection Time: 09/15/21  6:19 AM  Result Value Ref Range   Prothrombin Time 15.7 (H) 11.4 - 15.2 seconds   INR 1.2 0.8 - 1.2    Comment: (NOTE) INR goal varies based on device and disease states. Performed at [facility], [address] [place], [place] [address]   CBG monitoring, ED     Status: Abnormal   Collection Time: 09/15/21  9:58 AM  Result Value Ref Range   Glucose-Capillary 166 (H) 70 - 99 mg/dL    Comment: Glucose reference range applies only to samples taken after fasting for at least 8 hours.  CBG monitoring, ED     Status: Abnormal   Collection Time: 09/15/21 11:19 AM  Result Value Ref Range   Glucose-Capillary 154 (H) 70 - 99 mg/dL    Comment: Glucose reference range applies only to samples taken after fasting for at least 8 hours.  APTT     Status: Abnormal   Collection Time: 09/15/21  3:31 PM  Result Value Ref Range   aPTT 64 (H) 24 - 36 seconds    Comment:        IF BASELINE aPTT IS ELEVATED, SUGGEST PATIENT RISK ASSESSMENT BE USED TO DETERMINE  APPROPRIATE ANTICOAGULANT THERAPY. Performed at [facility], [address] [place], [place] [address]   CBG monitoring, ED  Status: Abnormal   Collection Time: 09/15/21  4:25 PM  Result Value Ref Range   Glucose-Capillary 164 (H) 70 - 99 mg/dL    Comment: Glucose reference range applies only to samples taken after fasting for at least 8 hours.   DG Chest Port 1 View  Result Date: 09/14/2021 CLINICAL DATA:  Chest pain EXAM: PORTABLE CHEST 1 VIEW COMPARISON:  02/17/2021 FINDINGS: Cardiomegaly. Both lungs are clear. The visualized skeletal structures are unremarkable. IMPRESSION: Cardiomegaly without acute abnormality of the lungs in AP portable projection. Electronically Signed   By: Jearld Lesch M.D.   On: 09/14/2021 16:52    Pending Labs Unresulted Labs (From admission, onward)     Start     Ordered   09/16/21 0500  CBC  Tomorrow morning,   STAT        09/15/21 0721   09/16/21 0100  APTT  Once,   STAT        09/15/21 1625   09/15/21 1222  Hemoglobin A1c  Add-on,   AD       Comments: To assess prior glycemic control    09/15/21 1221   09/14/21 1731  Hemoglobin A1c  Once,   STAT        09/14/21 1732            Vitals/Pain Today's Vitals   09/15/21 1415 09/15/21 1617 09/15/21 1630 09/15/21 1900  BP:  (!) 114/54 105/63 (!) 102/58  Pulse: 71 73 72 78  Resp: 12 (!) 27 19   Temp:      TempSrc:      SpO2: 95% 93% 94% 93%  Weight:      Height:      PainSc:        Isolation Precautions No active isolations  Medications Medications  diltiazem (CARDIZEM) 125 mg in dextrose 5% 125 mL (1 mg/mL) infusion (10 mg/hr Intravenous Rate/Dose Change 09/15/21 1320)  insulin glargine-yfgn (SEMGLEE) injection 20 Units (20 Units Subcutaneous Given 09/15/21 0956)  aspirin EC tablet 81 mg (81 mg Oral Given 09/15/21 0956)  nitroGLYCERIN (NITROSTAT) SL tablet 0.4 mg (has no administration in time range)  acetaminophen (TYLENOL) tablet 650 mg (has no administration in  time range)  ondansetron (ZOFRAN) injection 4 mg (has no administration in time range)  0.9 %  sodium chloride infusion (0 mLs Intravenous Stopping Infusion hung by another clincian 09/15/21 0828)  multivitamin-lutein (OCUVITE-LUTEIN) capsule 1 capsule (1 capsule Oral Given 09/15/21 0956)  omega-3 acid ethyl esters (LOVAZA) capsule 1 g (1 g Oral Given 09/14/21 2349)  heparin ADULT infusion 100 units/mL (25000 units/249mL) (1,450 Units/hr Intravenous Rate/Dose Change 09/15/21 1626)  pravastatin (PRAVACHOL) tablet 80 mg (80 mg Oral Given 09/14/21 2349)  sodium chloride flush (NS) 0.9 % injection 3 mL (3 mLs Intravenous Given 09/15/21 0956)  metoprolol tartrate (LOPRESSOR) tablet 25 mg (has no administration in time range)  insulin aspart (novoLOG) injection 0-15 Units (0 Units Subcutaneous Patient Refused/Not Given 09/15/21 1627)  insulin aspart (novoLOG) injection 0-5 Units (has no administration in time range)  diltiazem (CARDIZEM) injection 10 mg (10 mg Intravenous Given 09/14/21 1640)  heparin bolus via infusion 1,300 Units (1,300 Units Intravenous Bolus from Bag 09/15/21 0757)  sodium chloride 0.9 % bolus 500 mL (0 mLs Intravenous Stopped 09/15/21 1246)  heparin bolus via infusion 1,300 Units (1,300 Units Intravenous Bolus from Bag 09/15/21 1628)    Mobility walks with person assist Moderate fall risk   Focused Assessments Cardiac Assessment Handoff:  Cardiac Rhythm: Atrial flutter Lab  Results  Component Value Date   CKTOTAL 93 01/29/2012   CKMB 0.7 01/29/2012   TROPONINI <0.03 09/03/2016   No results found for: DDIMER Does the Patient currently have chest pain? No    R Recommendations: See Admitting Provider Note  Report given to:   Additional Notes: slightly visually impaired / urinal with assistance / cath lab 12/26

## 2021-09-15 NOTE — Progress Notes (Signed)
PROGRESS NOTE    NTHONY LEFFERTS  TDD:[id number] DOB: 02-27-1941 DOA: 09/14/2021 PCP: Kandyce Rud, MD    Brief Narrative:   80 y.o. 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 Patient states it occurred [place]. He notes he took nitro x 2 w/o any improvement in symptoms. He notes chest pain was left side  and described as sharp and pressure like. He noted no dizziness, no sob, no nausea no radiation of pain. He notes pain lasted for about 45 minutess. He was given asa by EMS and per patient pain resolved prior to arrival in ED. Patient notes that he does not get frequent chest discomfort but states he leads a very sedentary life style. He denies any cough/fever/chills/abdominal pain/dysuria or hematuria. On evaluation in ed patient found to have NSTEMI with ce [phone], patient also noted to be in afib with rvr to 130. Cardiology was consulted and patient was started on heparin drip as well as ccb drip.   Assessment & Plan:   Principal Problem:   NSTEMI (non-ST elevated myocardial infarction) (HCC) Active Problems:   Atrial fibrillation with RVR (HCC)  NSTEMI Cardiology, Dr. Juliann Pares aware Plan: Continue heparin GTT Telemetry monitoring EKG as needed chest pain Continue beta-blocker Aspirin High intensity statin Possible intervention in the next 24 to 48 hours  Atrial fibrillation with rapid ventricular response Likely exacerbated by NSTEMI Plan: Diltiazem drip Metoprolol as above Heparin GTT Home Eliquis on hold   Insulin-dependent diabetes mellitus Basal bolus regimen Moderate sliding scale Carb modified diet     OSA -cpap qhs    High-grade AV block status post PPM Defer interrogation to cardiology recommendations    HTN Beta-blocker  and Cardizem drip as above Home ACE inhibitor on hold    BPH  -no active issues     DVT prophylaxis: Heparin GTT Code Status: Full Family Communication: None today.  Attempted to call spouse Harry [name] [phone] on 12/25.  No answer.  Voicemail box full Disposition Plan: Status is: Inpatient  Remains inpatient appropriate because: NSTEMI, A. fib with RVR       Level of care: Progressive  Consultants:  Cardiology-[facility]  Procedures:  None  Antimicrobials: None   Subjective: Seen and examined.  Resting comfortably in bed.  Reports poor sleep secondary to alarms in room.  No pain complaints.  Chest pain-free  Objective: Vitals:   09/15/21 0600 09/15/21 0730 09/15/21 0952 09/15/21 1120  BP: 92/63 127/67 (!) 91/59 104/80  Pulse: 68 64 (!) 101 (!) 133  Resp: 16 17 14    Temp:      TempSrc:      SpO2: 93% 95% 94% 94%  Weight:      Height:       No intake or output data in the 24 hours ending 09/15/21 1153 Filed Weights   09/14/21 1421  Weight: 100.7 kg    Examination:  General exam: No acute distress Respiratory system: Lungs clear.  Normal work of breathing.  Room air Cardiovascular system: S1-S2, tachycardic, regular rhythm, no murmurs, no pedal edema Gastrointestinal system: Obese, NT/ND, normal bowel sounds Central nervous system: Alert and oriented. No focal neurological deficits. Extremities: Symmetric 5 x 5 power. Skin: No rashes, lesions or ulcers Psychiatry: Judgement and  insight appear normal. Mood & affect appropriate.     Data Reviewed: I have personally reviewed following labs and imaging studies  CBC: Recent Labs  Lab 09/14/21 1434 09/15/21 0619  WBC 11.3* 13.6*  HGB 10.8* 10.6*  HCT 34.7* 33.7*  MCV 76.3* 74.6*  PLT 292 [id number]   Basic Metabolic Panel: Recent Labs  Lab 09/14/21 1434 09/15/21 0619  NA 135 135  K 4.3 4.0  CL 101 102  CO2 25 23  GLUCOSE 113* 132*  BUN 15 17  CREATININE 0.90 1.01  CALCIUM 9.0 8.7*    GFR: Estimated Creatinine Clearance: 68.1 mL/min (by C-G formula based on SCr of 1.01 mg/dL). Liver Function Tests: Recent Labs  Lab 09/14/21 1434  AST 35  ALT 17  ALKPHOS 68  BILITOT 0.7  PROT 7.4  ALBUMIN 3.7   No results for input(s): LIPASE, AMYLASE in the last 168 hours. No results for input(s): AMMONIA in the last 168 hours. Coagulation Profile: Recent Labs  Lab 09/15/21 0619  INR 1.2   Cardiac Enzymes: No results for input(s): CKTOTAL, CKMB, CKMBINDEX, TROPONINI in the last 168 hours. BNP (last 3 results) No results for input(s): PROBNP in the last 8760 hours. HbA1C: No results for input(s): HGBA1C in the last 72 hours. CBG: Recent Labs  Lab 09/15/21 0026 09/15/21 0409 09/15/21 0958 09/15/21 1119  GLUCAP 137* 154* 166* 154*   Lipid Profile: Recent Labs    09/15/21 0619  CHOL 113  HDL 28*  LDLCALC 43  TRIG 210*  CHOLHDL 4.0   Thyroid Function Tests: Recent Labs    09/14/21 1857  TSH 2.782   Anemia Panel: No results for input(s): VITAMINB12, FOLATE, FERRITIN, TIBC, IRON, RETICCTPCT in the last 72 hours. Sepsis Labs: No results for input(s): PROCALCITON, LATICACIDVEN in the last 168 hours.  Recent Results (from the past 240 hour(s))  Resp Panel by RT-PCR (Flu A&B, Covid) Nasopharyngeal Swab     Status: None   Collection Time: 09/14/21  6:56 PM   Specimen: Nasopharyngeal Swab; Nasopharyngeal(NP) swabs in vial transport medium  Result Value Ref Range Status   SARS Coronavirus 2 by RT PCR NEGATIVE NEGATIVE Final    Comment: (NOTE) SARS-CoV-2 target nucleic acids are NOT DETECTED.  The SARS-CoV-2 RNA is generally detectable in upper respiratory specimens during the acute phase of infection. The lowest concentration of SARS-CoV-2 viral copies this assay can detect is 138 copies/mL. A negative result does not preclude SARS-Cov-2 infection and should not be used as the sole basis for treatment or other patient management decisions. A negative  result may occur with  improper specimen collection/handling, submission of specimen other than nasopharyngeal swab, presence of viral mutation(s) within the areas targeted by this assay, and inadequate number of viral copies(<138 copies/mL). A negative result must be combined with clinical observations, patient history, and epidemiological information. The expected result is Negative.  Fact Sheet for Patients:  EntrepreneurPulse.com.au  Fact Sheet for Healthcare Providers:  IncredibleEmployment.be  This test is no t yet approved or cleared by the [place] FDA and  has been authorized for detection and/or diagnosis of SARS-CoV-2 by FDA under an Emergency Use Authorization (EUA). This EUA will remain  in effect (meaning this test can be used) for the duration of the COVID-19 declaration under Section 564(b)(1) of the Act, 21 U.S.C.section 360bbb-3(b)(1), unless the authorization is terminated  or revoked sooner.       Influenza A by PCR NEGATIVE NEGATIVE Final   Influenza B by PCR NEGATIVE NEGATIVE  Final    Comment: (NOTE) The Xpert Xpress SARS-CoV-2/FLU/RSV plus assay is intended as an aid in the diagnosis of influenza from Nasopharyngeal swab specimens and should not be used as a sole basis for treatment. Nasal washings and aspirates are unacceptable for Xpert Xpress SARS-CoV-2/FLU/RSV testing.  Fact Sheet for Patients: EntrepreneurPulse.com.au  Fact Sheet for Healthcare Providers: IncredibleEmployment.be  This test is not yet approved or cleared by the [place] FDA and has been authorized for detection and/or diagnosis of SARS-CoV-2 by FDA under an Emergency Use Authorization (EUA). This EUA will remain in effect (meaning this test can be used) for the duration of the COVID-19 declaration under Section 564(b)(1) of the Act, 21 U.S.C. section 360bbb-3(b)(1), unless the authorization is  terminated or revoked.  Performed at [facility], [address], [place], [place] [address]          Radiology Studies: [facility] Chest [place] 1 View  Result Date: 09/14/2021 CLINICAL DATA:  Chest pain EXAM: PORTABLE CHEST 1 VIEW COMPARISON:  02/17/2021 FINDINGS: Cardiomegaly. Both lungs are clear. The visualized skeletal structures are unremarkable. IMPRESSION: Cardiomegaly without acute abnormality of the lungs in AP portable projection. Electronically Signed   By: Delanna Ahmadi M.D.   On: 09/14/2021 16:52        Scheduled Meds:  aspirin EC  81 mg Oral Daily   insulin aspart  0-15 Units Subcutaneous TID WC   insulin aspart  0-5 Units Subcutaneous QHS   insulin glargine-yfgn  20 Units Subcutaneous BID   metoprolol tartrate  25 mg Oral BID   multivitamin-lutein  1 capsule Oral Daily   omega-3 acid ethyl esters  1 g Oral QHS   pravastatin  80 mg Oral QHS   sodium chloride flush  3 mL Intravenous Q12H   Continuous Infusions:  diltiazem (CARDIZEM) infusion 5 mg/hr (09/14/21 1641)   heparin 1,300 Units/hr (09/15/21 1105)     LOS: 1 day    Time spent: 35 minutes    Sidney Ace, MD [facility]   If 7PM-7AM, please contact night-coverage  09/15/2021, 11:53 AM

## 2021-09-15 NOTE — Consult Note (Signed)
ANTICOAGULATION CONSULT NOTE  Pharmacy Consult for heparin Indication: chest pain/ACS  Allergies  Allergen Reactions   Atorvastatin     Other reaction(s): Muscle Pain   Bupropion Other (See Comments)    Other reaction(s): Dizziness CNS side effects   Citalopram     Other reaction(s): Other (See Comments) GI side effects   Pioglitazone     Other reaction(s): Muscle Pain    Patient Measurements: Height: 5\' 7"  (170.2 cm) Weight: 100.7 kg (222 lb) IBW/kg (Calculated) : 66.1 Heparin Dosing Weight: 88 kg  Vital Signs: BP: 92/63 (12/25 0600) Pulse Rate: 68 (12/25 0600)  Labs: Recent Labs    09/14/21 1434 09/14/21 1629 09/14/21 1857 09/15/21 0316 09/15/21 0619  HGB 10.8*  --   --   --  10.6*  HCT 34.7*  --   --   --  33.7*  PLT 292  --   --   --  323  APTT  --  36  --  60*  --   LABPROT  --   --   --   --  15.7*  INR  --   --   --   --  1.2  HEPARINUNFRC  --  >1.10*  --   --   --   CREATININE 0.90  --   --   --  1.01  TROPONINIHS 611* 3,093* 12,813* >24,000*  --      Estimated Creatinine Clearance: 68.1 mL/min (by C-G formula based on SCr of 1.01 mg/dL).   Medical History: Past Medical History:  Diagnosis Date   Aortic stenosis    CAD (coronary artery disease)    Diabetes (HCC)    HTN (hypertension)    PAF (paroxysmal atrial fibrillation) (HCC)     Medications:  On Eliquis 5 mg BID, last dose 12/24 @ 0900   Assessment: 80 y.o. male with a history of coronary artery disease, diabetes, hypertension, paroxysmal atrial fibrillation, aortic stenosis who presents with complaints of chest discomfort.  Troponin elevated to 611. Pharmacy has been consulted for heparin dosing for ACS.   Baseline labs: Hgb 10.8, plts 292, aPTT 36 and heparin level >1.10   Goal of Therapy:  Heparin level 0.3-0.7 units/ml aPTT 66-102 seconds Monitor platelets by anticoagulation protocol: Yes   Plan:  --aPTT 60, subtherapeutic --Heparin 1300 unit bolus --Increase heparin  infusion to 1300 units/hr --Check aPTT at 1600, will monitor with aPTT until heparin level correlation due to recent DOAC use --Continue to monitor H&H and platelets  70, PharmD, BCPS Clinical Pharmacist 09/15/2021 7:19 AM

## 2021-09-15 NOTE — ED Notes (Signed)
Pt provided vanilla ice cream as requested

## 2021-09-15 NOTE — ED Notes (Signed)
Pt alert and talking to RN at this time. °

## 2021-09-15 NOTE — ED Notes (Signed)
Pharmacy contacted for cardizem 

## 2021-09-15 NOTE — ED Notes (Signed)
Labs drawn and pt given warm blankets at this time.

## 2021-09-15 NOTE — ED Notes (Signed)
Dr Georgeann Oppenheim messaged via secure chat to notify of continued HR 130s in a flutter with BP 108/50 (67) after cardizem increased to 7.5

## 2021-09-16 DIAGNOSIS — I214 Non-ST elevation (NSTEMI) myocardial infarction: Secondary | ICD-10-CM | POA: Diagnosis not present

## 2021-09-16 LAB — GLUCOSE, CAPILLARY
Glucose-Capillary: 140 mg/dL — ABNORMAL HIGH (ref 70–99)
Glucose-Capillary: 149 mg/dL — ABNORMAL HIGH (ref 70–99)
Glucose-Capillary: 144 mg/dL — ABNORMAL HIGH (ref 70–99)
Glucose-Capillary: 185 mg/dL — ABNORMAL HIGH (ref 70–99)

## 2021-09-16 LAB — ECHOCARDIOGRAM COMPLETE
AR max vel: 0.7 cm2
AV Area VTI: 0.67 cm2
AV Area mean vel: 0.62 cm2
AV Mean grad: 20.7 mmHg
AV Peak grad: 35.8 mmHg
Ao pk vel: 2.99 m/s
Area-P 1/2: 5.97 cm2
Calc EF: 45.3 %
Height: 67 in
S' Lateral: 4.75 cm
Single Plane A2C EF: 44.9 %
Single Plane A4C EF: 45.6 %
Weight: 3552 oz

## 2021-09-16 LAB — HEPARIN LEVEL (UNFRACTIONATED): Heparin Unfractionated: 1.03 IU/mL — ABNORMAL HIGH (ref 0.30–0.70)

## 2021-09-16 LAB — URINALYSIS, COMPLETE (UACMP) WITH MICROSCOPIC
Bilirubin Urine: NEGATIVE
Glucose, UA: NEGATIVE mg/dL
Hgb urine dipstick: NEGATIVE
Ketones, ur: NEGATIVE mg/dL
Nitrite: NEGATIVE
Protein, ur: 30 mg/dL — AB
Specific Gravity, Urine: 1.015 (ref 1.005–1.030)
pH: 7 (ref 5.0–8.0)

## 2021-09-16 LAB — CBC
HCT: 31.8 % — ABNORMAL LOW (ref 39.0–52.0)
Hemoglobin: 10 g/dL — ABNORMAL LOW (ref 13.0–17.0)
MCH: 23.1 pg — ABNORMAL LOW (ref 26.0–34.0)
MCHC: 31.4 g/dL (ref 30.0–36.0)
MCV: 73.6 fL — ABNORMAL LOW (ref 80.0–100.0)
Platelets: 301 10*3/uL (ref 150–400)
RBC: 4.32 MIL/uL (ref 4.22–5.81)
RDW: 17.6 % — ABNORMAL HIGH (ref 11.5–15.5)
WBC: 11.8 10*3/uL — ABNORMAL HIGH (ref 4.0–10.5)
nRBC: 0 % (ref 0.0–0.2)

## 2021-09-16 LAB — MAGNESIUM: Magnesium: 1.9 mg/dL (ref 1.7–2.4)

## 2021-09-16 LAB — BASIC METABOLIC PANEL
Anion gap: 8 (ref 5–15)
BUN: 14 mg/dL (ref 8–23)
CO2: 24 mmol/L (ref 22–32)
Calcium: 8.4 mg/dL — ABNORMAL LOW (ref 8.9–10.3)
Chloride: 104 mmol/L (ref 98–111)
Creatinine, Ser: 0.99 mg/dL (ref 0.61–1.24)
GFR, Estimated: >60 mL/min (ref >60)
Glucose, Bld: 203 mg/dL — ABNORMAL HIGH (ref 70–99)
Potassium: 3.8 mmol/L (ref 3.5–5.1)
Sodium: 136 mmol/L (ref 135–145)

## 2021-09-16 LAB — TROPONIN I (HIGH SENSITIVITY)
Troponin I (High Sensitivity): 8186 ng/L (ref <18)
Troponin I (High Sensitivity): 7651 ng/L (ref <18)

## 2021-09-16 LAB — APTT
aPTT: 87 seconds — ABNORMAL HIGH (ref 24–36)
aPTT: 80 seconds — ABNORMAL HIGH (ref 24–36)

## 2021-09-16 MED ORDER — SODIUM CHLORIDE 0.9 % WEIGHT BASED INFUSION
1.0000 mL/kg/h | INTRAVENOUS | Status: DC
  Administered 2021-09-17: 01:00:00 1 mL/kg/h via INTRAVENOUS

## 2021-09-16 MED ORDER — ASPIRIN 81 MG PO CHEW
81.0000 mg | CHEWABLE_TABLET | ORAL | Status: AC
  Administered 2021-09-17: 06:00:00 81 mg via ORAL
  Filled 2021-09-16: qty 1

## 2021-09-16 MED ORDER — SODIUM CHLORIDE 0.9 % IV SOLN: 250.0000 mL | INTRAVENOUS | Status: DC | PRN

## 2021-09-16 MED ORDER — SODIUM CHLORIDE 0.9 % WEIGHT BASED INFUSION
3.0000 mL/kg/h | INTRAVENOUS | Status: AC
  Administered 2021-09-16: 23:00:00 3 mL/kg/h via INTRAVENOUS

## 2021-09-16 MED ORDER — SODIUM CHLORIDE 0.9% FLUSH: 3.0000 mL | INTRAVENOUS | Status: DC | PRN

## 2021-09-16 NOTE — Consult Note (Signed)
ANTICOAGULATION CONSULT NOTE  Pharmacy Consult for heparin Indication: chest pain/ACS  Allergies  Allergen Reactions   Atorvastatin     Other reaction(s): Muscle Pain   Bupropion Other (See Comments)    Other reaction(s): Dizziness CNS side effects   Citalopram     Other reaction(s): Other (See Comments) GI side effects   Pioglitazone     Other reaction(s): Muscle Pain    Patient Measurements: Height: 5\' 7"  (170.2 cm) Weight: 98.5 kg (217 lb 2.5 oz) IBW/kg (Calculated) : 66.1 Heparin Dosing Weight: 88 kg  Vital Signs: Temp: 98.1 F (36.7 C) (12/25 2332) Temp Source: Oral (12/25 2219) BP: 111/61 (12/25 2332) Pulse Rate: 70 (12/25 2332)  Labs: Recent Labs    09/14/21 1434 09/14/21 1434 09/14/21 1629 09/14/21 1857 09/15/21 0316 09/15/21 0619 09/15/21 1531 09/16/21 0133  HGB 10.8*  --   --   --   --  10.6*  --   --   HCT 34.7*  --   --   --   --  33.7*  --   --   PLT 292  --   --   --   --  323  --   --   APTT  --    < > 36  --  60*  --  64* 87*  LABPROT  --   --   --   --   --  15.7*  --   --   INR  --   --   --   --   --  1.2  --   --   HEPARINUNFRC  --   --  >1.10*  --   --   --   --   --   CREATININE 0.90  --   --   --   --  1.01  --   --   TROPONINIHS 611*  --  3,093* 12,813* >24,000*  --   --   --    < > = values in this interval not displayed.     Estimated Creatinine Clearance: 67.4 mL/min (by C-G formula based on SCr of 1.01 mg/dL).   Medical History: Past Medical History:  Diagnosis Date   Aortic stenosis    CAD (coronary artery disease)    Diabetes (HCC)    HTN (hypertension)    PAF (paroxysmal atrial fibrillation) (HCC)     Medications:  On Eliquis 5 mg BID, last dose 12/24 @ 0900   Assessment: 80 y.o. male with a history of coronary artery disease, diabetes, hypertension, paroxysmal atrial fibrillation, aortic stenosis who presents with complaints of chest discomfort. Troponin elevated to 611. Pharmacy has been consulted for heparin  dosing for ACS.   Baseline labs: Hgb 10.8, plts 292, aPTT 36 and heparin level >1.10   Date Time aPTT/HL Rate/comment  1224 0316 aPTT 60 1150 un/hr, subtherapeutic  1224 1531 aPTT 64 1300 un/hr, subtherapeutic  1226    0133    aPTT 87         1450 un/hr, therapeutic X 1   Goal of Therapy:  Heparin level 0.3-0.7 units/ml aPTT 66-102 seconds Monitor platelets by anticoagulation protocol: Yes   Plan:  12/26:  aPTT @ 0133 = 87, therapeutic X 1  Will continue pt on current rate and recheck HL and aPTT on 12/26 @ 0930.   1/27, PharmD Clinical Pharmacist 09/16/2021 2:30 AM

## 2021-09-16 NOTE — Evaluation (Signed)
Occupational Therapy Evaluation Patient Details Name: Harry [name] MRN: [id number] DOB: 12-Dec-1940 Today's Date: 09/16/2021   History of Present Illness 80 y.o. 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 Patient states [place] [employer] [place]. He notes he took nitro x 2 w/o any improvement in symptoms. He notes chest pain was left side  and described as sharp and pressure like. He noted no dizziness, no sob, no nausea no radiation of pain. He notes pain lasted for about 45 minutess. He was given asa by EMS and per patient pain resolved prior to arrival in ED. Patient notes that he does not get frequent chest discomfort but states he leads a very sedentary life style. He denies any cough/fever/chills/abdominal pain/dysuria or hematuria. On evaluation in ed patient found to have NSTEMI with ce [phone], patient also noted to be in afib with rvr to 130. Cardiology was consulted and patient was started on heparin drip as well as ccb drip.   Clinical Impression   Patient presenting with decreased Ind in self care, balance, functional mobility/transfers, endurance, and safety awareness. Patient reports mod I at baseline with use of RW and occasional furniture cruising. He lives at home with wife who is available to assist at discharge as needed. Pt is hesitant but cooperative with therapeutic intervention. Patient currently functioning at min guard - min A with self care tasks and functional mobility with use of RW. Pt initially demonstrates posterior bias in standing but is able to self correct and reports he "does that at home".  Patient will benefit from acute OT to increase overall independence in the areas of ADLs, functional mobility, and safety awareness in order to safely  discharge home with family.      Recommendations for follow up therapy are one component of a multi-disciplinary discharge planning process, led by the attending physician.  Recommendations may be updated based on patient status, additional functional criteria and insurance authorization.   Follow Up Recommendations  Home health OT    Assistance Recommended at Discharge Intermittent Supervision/Assistance  Functional Status Assessment  Patient has had a recent decline in their functional status and demonstrates the ability to make significant improvements in function in a reasonable and predictable amount of time.  Equipment Recommendations  None recommended by OT       Precautions / Restrictions Precautions Precautions: Fall      Mobility Bed Mobility Overal bed mobility: Needs Assistance Bed Mobility: Supine to Sit;Sit to Supine     Supine to sit: Supervision Sit to supine: Supervision   General bed mobility comments: increased time but no physical assistance needed.    Transfers Overall transfer level: Needs assistance Equipment used: Rolling walker (2 wheels) Transfers: Sit to/from Stand;Bed to chair/wheelchair/BSC Sit to Stand: Min assist;Min guard Stand pivot transfers: Min assist   Step pivot transfers: Min assist     General transfer comment: min A initially with posterior bias      Balance Overall balance assessment: Needs assistance Sitting-balance support: Feet supported Sitting balance-Leahy Scale: Good     Standing balance support: Reliant on assistive device for balance;During functional activity Standing balance-Leahy Scale: Fair  ADL either performed or assessed with clinical judgement   ADL Overall ADL's : Needs assistance/impaired                                       General ADL Comments: min  guard -  min A for functional tasks and transfers with use of RW.     Vision Baseline  Vision/History: 5 Retinopathy Ability to See in Adequate Light: 1 Impaired Patient Visual Report: No change from baseline              Pertinent Vitals/Pain Pain Assessment: No/denies pain     Hand Dominance Right   Extremity/Trunk Assessment Upper Extremity Assessment Upper Extremity Assessment: Overall WFL for tasks assessed;Generalized weakness   Lower Extremity Assessment Lower Extremity Assessment: Overall WFL for tasks assessed;Generalized weakness       Communication Communication Communication: No difficulties   Cognition Arousal/Alertness: Awake/alert Behavior During Therapy: WFL for tasks assessed/performed Overall Cognitive Status: Within Functional Limits for tasks assessed                                 General Comments: Pt is very pleasant but needing encouragement for participation.                Home Living Family/patient expects to be discharged to:: Private residence Living Arrangements: Spouse/significant other Available Help at Discharge: Family;Available 24 hours/day Type of Home: House Home Access: Stairs to enter [employer] of Steps: 3-4 Entrance Stairs-Rails: Right;Left Home Layout: One level     Bathroom Shower/Tub: Other (comment) (walk in tub)   Bathroom Toilet: Standard Bathroom Accessibility: No   Home Equipment: Grab bars - tub/shower;Shower seat - built [employer] fundraiser (2 wheels);Cane - single point          Prior Functioning/Environment Prior Level of Function : Needs assist             Mobility Comments: Pt reports mod I with use of RW for short distance mobility within the home. He reports not leaving home often. ADLs Comments: Wife assists him with washing hair and getting in and out of tub. He reports being able to dress himself.        OT Problem List: Decreased strength;Decreased activity tolerance;Impaired balance (sitting and/or standing);Decreased knowledge of use of DME or  AE;Decreased knowledge of precautions;Decreased safety awareness      OT Treatment/Interventions: Self-care/ADL training;Therapeutic exercise;Therapeutic activities;Energy conservation;DME and/or AE instruction;Patient/family education;Manual therapy;Balance training    OT Goals(Current goals can be found in the care plan section) Acute Rehab OT Goals Patient Stated Goal: to go home OT Goal Formulation: With patient Time For Goal Achievement: 09/30/21 Potential to Achieve Goals: Good ADL Goals Pt Will Perform Grooming: with modified independence;standing Pt Will Perform Lower Body Dressing: with modified independence;sit to/from stand Pt Will Transfer to Toilet: with modified independence;ambulating Pt Will Perform Toileting - Clothing Manipulation and hygiene: with modified independence;sit to/from stand  OT Frequency: Min 2X/week   Barriers to D/C:    none known at this time          AM-PAC OT "6 Clicks" Daily Activity     Outcome Measure Help from another person eating meals?: None Help from another person taking care of personal grooming?: A Little Help from another person toileting, which includes using toliet, bedpan, or urinal?: A Little Help  from another person bathing (including washing, rinsing, drying)?: A Little Help from another person to put on and taking off regular upper body clothing?: None Help from another person to put on and taking off regular lower body clothing?: A Little 6 Click Score: 20   End of Session Equipment Utilized During Treatment: Rolling walker (2 wheels) Nurse Communication: Mobility status  Activity Tolerance: Patient tolerated treatment well;Patient limited by fatigue Patient left: in bed;with call bell/phone within reach;with bed alarm set  OT Visit Diagnosis: Unsteadiness on feet (R26.81);Repeated falls (R29.6);Muscle weakness (generalized) (M62.81)                Time: 0950-1020 OT Time Calculation (min): 30 min Charges:  OT General  Charges $OT Visit: 1 Visit OT Evaluation $OT Eval Moderate Complexity: 1 Mod OT Treatments $Self Care/Home Management : 8-22 mins $Therapeutic Activity: 8-22 mins  Darleen Crocker, MS, OTR/L , CBIS ascom [phone]  09/16/21, 10:56 AM

## 2021-09-16 NOTE — Consult Note (Signed)
ANTICOAGULATION CONSULT NOTE  Pharmacy Consult for heparin Indication: chest pain/ACS  Allergies  Allergen Reactions   Atorvastatin     Other reaction(s): Muscle Pain   Bupropion Other (See Comments)    Other reaction(s): Dizziness CNS side effects   Citalopram     Other reaction(s): Other (See Comments) GI side effects   Pioglitazone     Other reaction(s): Muscle Pain    Patient Measurements: Height: 5\' 7"  (170.2 cm) Weight: 98.5 kg (217 lb 2.5 oz) IBW/kg (Calculated) : 66.1 Heparin Dosing Weight: 88 kg  Vital Signs: Temp: 98 F (36.7 C) (12/26 0817) BP: 133/69 (12/26 0817) Pulse Rate: 71 (12/26 0817)  Labs: Recent Labs    09/14/21 1434 09/14/21 1434 09/14/21 1629 09/14/21 1857 09/15/21 0316 09/15/21 0619 09/15/21 1531 09/16/21 0133 09/16/21 0532 09/16/21 0941  HGB 10.8*  --   --   --   --  10.6*  --   --  10.0*  --   HCT 34.7*  --   --   --   --  33.7*  --   --  31.8*  --   PLT 292  --   --   --   --  323  --   --  301  --   APTT  --    < > 36  --  60*  --  64* 87*  --  80*  LABPROT  --   --   --   --   --  15.7*  --   --   --   --   INR  --   --   --   --   --  1.2  --   --   --   --   HEPARINUNFRC  --   --  >1.10*  --   --   --   --   --   --  1.03*  CREATININE 0.90  --   --   --   --  1.01  --   --   --  0.99  TROPONINIHS 611*  --  3,093* 12,813* >24,000*  --   --   --   --   --    < > = values in this interval not displayed.     Estimated Creatinine Clearance: 68.8 mL/min (by C-G formula based on SCr of 0.99 mg/dL).   Medical History: Past Medical History:  Diagnosis Date   Aortic stenosis    CAD (coronary artery disease)    Diabetes (HCC)    HTN (hypertension)    PAF (paroxysmal atrial fibrillation) (HCC)     Medications:  On Eliquis 5 mg BID, last dose 12/24 @ 0900   Assessment: 80 y.o. male with a history of coronary artery disease, diabetes, hypertension, paroxysmal atrial fibrillation, aortic stenosis who presents with complaints of  chest discomfort. Troponin elevated to 611. Pharmacy has been consulted for heparin dosing for ACS.   Baseline labs: Hgb 10.8, plts 292, aPTT 36 and heparin level >1.10   Date Time aPTT/HL Rate/comment  1224 0316 aPTT 60 1150 un/hr, subtherapeutic  1224 1531 aPTT 64 1300 un/hr, subtherapeutic  12/26 0133 aPTT 87 12/26 0941 80/1.03 Therapeutic.   Goal of Therapy:  Heparin level 0.3-0.7 units/ml aPTT 66-102 seconds Monitor platelets by anticoagulation protocol: Yes   Plan:  aPTT is therapeutic. Will continue heparin infusion at 1450 units/hr. Will recheck heparin level/aPTT/CBC with AM labs.  Switch to heparin level monitoring once aPTT and heparin level  are correlating.   Ronnald Ramp, PharmD Clinical Pharmacist 09/16/2021 10:31 AM

## 2021-09-16 NOTE — Progress Notes (Signed)
PT Cancellation Note  Patient Details Name: Harry [name] MRN: [id number] DOB: Oct 20, 1940   Cancelled Treatment:    Reason Eval/Treat Not Completed: Other (comment).  PT consult received.  Chart reviewed.  Discussed pt's status with pt's nurse; nurse recommending holding physical therapy at this time and to re-attempt PT evaluation after cardiac cath procedure (pt admitted with NSTEMI and plan for cardiac cath tomorrow).  Will re-attempt PT evaluation at a later date/time as medically appropriate.  Hendricks Limes, PT 09/16/21, 1:41 PM

## 2021-09-16 NOTE — Progress Notes (Signed)
PROGRESS NOTE    Harry [name]  [id number] DOB: 19-May-1941 DOA: 09/14/2021 PCP: Derinda Late, MD    Brief Narrative:   80 y.o. 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 Patient states it occurred [place]. He notes he took nitro x 2 w/o any improvement in symptoms. He notes chest pain was left side  and described as sharp and pressure like. He noted no dizziness, no sob, no nausea no radiation of pain. He notes pain lasted for about 45 minutess. He was given asa by EMS and per patient pain resolved prior to arrival in ED. Patient notes that he does not get frequent chest discomfort but states he leads a very sedentary life style. He denies any cough/fever/chills/abdominal pain/dysuria or hematuria. On evaluation in ed patient found to have NSTEMI with ce [phone], patient also noted to be in afib with rvr to 130. Cardiology was consulted and patient was started on heparin drip as well as ccb drip.  Per cardiology patient scheduled for left heart catheterization with Dr. Saralyn Pilar on 12/27.  Weaned off diltiazem gtt.  Now on p.o. diltiazem 30 mg every 6 hours.   Assessment & Plan:   Principal Problem:   NSTEMI (non-ST elevated myocardial infarction) [facility]) Active Problems:   Atrial fibrillation with RVR ([place])  NSTEMI Cardiology consulted, [facility] Plan: Continue heparin GTT Telemetry monitoring EKG as needed chest pain Continue beta-blocker Transthoracic echo Aspirin High intensity statin Left heart cath planned 12/27 N.p.o. after midnight  Atrial fibrillation with rapid ventricular response Likely exacerbated by NSTEMI Rate improved Weaned off diltiazem drip P.o. diltiazem started per cardiology Plan: Continue p.o. diltiazem 30 mg  every 6 hours Consider transition to long-acting Cardizem CD from tomorrow Metoprolol as above Heparin GTT Home Eliquis on hold   Insulin-dependent diabetes mellitus Basal bolus regimen Moderate sliding scale Carb modified diet     OSA -cpap qhs    High-grade AV block status post PPM Defer interrogation to cardiology recommendations Pacer appears to be functioning appropriately Outpatient EP follow-up    HTN Beta-blocker and Cardizem as above Home ACE inhibitor on hold    BPH  -no active issues     DVT prophylaxis: Heparin GTT Code Status: Full Family Communication: None today.  Attempted to call spouse Dub Mikes [phone] on 12/25.  No answer.  Voicemail box full.  Plan of care discussed at length with patient Disposition Plan: Status is: Inpatient  Remains inpatient appropriate because: NSTEMI, A. fib with RVR.  Plan for cardiac catheterization 12/27       Level of care: Progressive Cardiac  Consultants:  Cardiology-[facility]  Procedures:  None  Antimicrobials: None   Subjective: Seen and examined.  Resting comfortably in bed.  No acute distress.  Chest pain-free  Objective: Vitals:   09/15/21 2219 09/15/21 2332 09/16/21 0328 09/16/21 0817  BP: (!) 117/56 111/61 111/67 133/69  Pulse: 77 70 62 71  Resp: 18 17 16 18   Temp: 98.4 F (36.9 C) 98.1 F (36.7 C) 97.9 F (36.6 C) 98 F (36.7 C)  TempSrc: Oral     SpO2: 93% 95% 97% 98%  Weight:      Height:        Intake/Output Summary (Last 24 hours) at  09/16/2021 1126 Last data filed at 09/16/2021 0810 Gross per 24 hour  Intake --  Output 1250 ml  Net -1250 ml   Filed Weights   09/14/21 1421 09/15/21 2125  Weight: 100.7 kg 98.5 kg    Examination:  General exam: No apparent distress Respiratory system: Lungs clear.  Normal work of breathing.  Room air Cardiovascular system: S1-S2, regular rate, irregular rhythm, no murmurs, no pedal edema Gastrointestinal system: Obese, NT/ND,  normal bowel sounds Central nervous system: Alert and oriented. No focal neurological deficits. Extremities: Symmetric 5 x 5 power. Skin: No rashes, lesions or ulcers Psychiatry: Judgement and insight appear normal. Mood & affect appropriate.     Data Reviewed: I have personally reviewed following labs and imaging studies  CBC: Recent Labs  Lab 09/14/21 1434 09/15/21 0619 09/16/21 0532  WBC 11.3* 13.6* 11.8*  HGB 10.8* 10.6* 10.0*  HCT 34.7* 33.7* 31.8*  MCV 76.3* 74.6* 73.6*  PLT 292 323 [id number]   Basic Metabolic Panel: Recent Labs  Lab 09/14/21 1434 09/15/21 0619 09/16/21 0941  NA 135 135 136  K 4.3 4.0 3.8  CL 101 102 104  CO2 25 23 24   GLUCOSE 113* 132* 203*  BUN 15 17 14   CREATININE 0.90 1.01 0.99  CALCIUM 9.0 8.7* 8.4*  MG  --   --  1.9   GFR: Estimated Creatinine Clearance: 68.8 mL/min (by C-G formula based on SCr of 0.99 mg/dL). Liver Function Tests: Recent Labs  Lab 09/14/21 1434  AST 35  ALT 17  ALKPHOS 68  BILITOT 0.7  PROT 7.4  ALBUMIN 3.7   No results for input(s): LIPASE, AMYLASE in the last 168 hours. No results for input(s): AMMONIA in the last 168 hours. Coagulation Profile: Recent Labs  Lab 09/15/21 0619  INR 1.2   Cardiac Enzymes: No results for input(s): CKTOTAL, CKMB, CKMBINDEX, TROPONINI in the last 168 hours. BNP (last 3 results) No results for input(s): PROBNP in the last 8760 hours. HbA1C: No results for input(s): HGBA1C in the last 72 hours. CBG: Recent Labs  Lab 09/15/21 0958 09/15/21 1119 09/15/21 1625 09/15/21 2108 09/16/21 0811  GLUCAP 166* 154* 164* 184* 140*   Lipid Profile: Recent Labs    09/15/21 0619  CHOL 113  HDL 28*  LDLCALC 43  TRIG 210*  CHOLHDL 4.0   Thyroid Function Tests: Recent Labs    09/14/21 1857  TSH 2.782   Anemia Panel: No results for input(s): VITAMINB12, FOLATE, FERRITIN, TIBC, IRON, RETICCTPCT in the last 72 hours. Sepsis Labs: No results for input(s): PROCALCITON,  LATICACIDVEN in the last 168 hours.  Recent Results (from the past 240 hour(s))  Resp Panel by RT-PCR (Flu A&B, Covid) Nasopharyngeal Swab     Status: None   Collection Time: 09/14/21  6:56 PM   Specimen: Nasopharyngeal Swab; Nasopharyngeal(NP) swabs in vial transport medium  Result Value Ref Range Status   SARS Coronavirus 2 by RT PCR NEGATIVE NEGATIVE Final    Comment: (NOTE) SARS-CoV-2 target nucleic acids are NOT DETECTED.  The SARS-CoV-2 RNA is generally detectable in upper respiratory specimens during the acute phase of infection. The lowest concentration of SARS-CoV-2 viral copies this assay can detect is 138 copies/mL. A negative result does not preclude SARS-Cov-2 infection and should not be used as the sole basis for treatment or other patient management decisions. A negative result may occur with  improper specimen collection/handling, submission of specimen other than nasopharyngeal swab, presence of viral mutation(s) within the areas targeted by this assay,  and inadequate number of viral copies(<138 copies/mL). A negative result must be combined with clinical observations, patient history, and epidemiological information. The expected result is Negative.  Fact Sheet for Patients:  BloggerCourse.com  Fact Sheet for Healthcare Providers:  SeriousBroker.it  This test is no t yet approved or cleared by the [place] FDA and  has been authorized for detection and/or diagnosis of SARS-CoV-2 by FDA under an Emergency Use Authorization (EUA). This EUA will remain  in effect (meaning this test can be used) for the duration of the COVID-19 declaration under Section 564(b)(1) of the Act, 21 U.S.C.section 360bbb-3(b)(1), unless the authorization is terminated  or revoked sooner.       Influenza A by PCR NEGATIVE NEGATIVE Final   Influenza B by PCR NEGATIVE NEGATIVE Final    Comment: (NOTE) The Xpert Xpress  SARS-CoV-2/FLU/RSV plus assay is intended as an aid in the diagnosis of influenza from Nasopharyngeal swab specimens and should not be used as a sole basis for treatment. Nasal washings and aspirates are unacceptable for Xpert Xpress SARS-CoV-2/FLU/RSV testing.  Fact Sheet for Patients: BloggerCourse.com  Fact Sheet for Healthcare Providers: SeriousBroker.it  This test is not yet approved or cleared by the [place] FDA and has been authorized for detection and/or diagnosis of SARS-CoV-2 by FDA under an Emergency Use Authorization (EUA). This EUA will remain in effect (meaning this test can be used) for the duration of the COVID-19 declaration under Section 564(b)(1) of the Act, 21 U.S.C. section 360bbb-3(b)(1), unless the authorization is terminated or revoked.  Performed at [facility], [address], [place], [place] [address]          Radiology Studies: [facility] Chest [place] 1 View  Result Date: 09/14/2021 CLINICAL DATA:  Chest pain EXAM: PORTABLE CHEST 1 VIEW COMPARISON:  02/17/2021 FINDINGS: Cardiomegaly. Both lungs are clear. The visualized skeletal structures are unremarkable. IMPRESSION: Cardiomegaly without acute abnormality of the lungs in AP portable projection. Electronically Signed   By: Jearld Lesch M.D.   On: 09/14/2021 16:52   ECHOCARDIOGRAM COMPLETE  Result Date: 09/16/2021    ECHOCARDIOGRAM REPORT   Patient Name:   Harry [name] Date of Exam: 09/15/2021 Medical Rec #:  [id number]         Height:       67.0 in Accession #:    [id number]        Weight:       222.0 lb Date of Birth:  1941/03/22          BSA:          2.114 m Patient Age:    80 years          BP:           92/63 mmHg Patient Gender: M                 HR:           69 bpm. Exam Location:  [facility] Procedure: 2D Echo and Intracardiac Opacification Agent Indications:     Chest Pain R07.9  History:         Patient has prior history of  Echocardiogram examinations, most                  recent 04/02/2021.  Sonographer:     Overton Mam RDCS Referring Phys:  [id number] SARA-MAIZ A THOMAS Diagnosing Phys: Alwyn Pea MD  Sonographer Comments: Suboptimal apical window and suboptimal subcostal window. Image acquisition challenging due to patient  body habitus. IMPRESSIONS  1. Ant/septal/apical hypo.  2. Left ventricular ejection fraction, by estimation, is 40 to 45%. The left ventricle has mildly decreased function. The left ventricle demonstrates regional wall motion abnormalities (see scoring diagram/findings for description). The left ventricular  internal cavity size was moderately to severely dilated. Left ventricular diastolic parameters are consistent with Grade II diastolic dysfunction (pseudonormalization).  3. Right ventricular systolic function is low normal. The right ventricular size is mildly enlarged.  4. The mitral valve is grossly normal. Trivial mitral valve regurgitation.  5. The aortic valve is normal in structure. Aortic valve regurgitation is not visualized. FINDINGS  Left Ventricle: Left ventricular ejection fraction, by estimation, is 40 to 45%. The left ventricle has mildly decreased function. The left ventricle demonstrates regional wall motion abnormalities. Definity contrast agent was given IV to delineate the left ventricular endocardial borders. The left ventricular internal cavity size was moderately to severely dilated. There is no left ventricular hypertrophy. Left ventricular diastolic parameters are consistent with Grade II diastolic dysfunction (pseudonormalization). Right Ventricle: The right ventricular size is mildly enlarged. No increase in right ventricular wall thickness. Right ventricular systolic function is low normal. Left Atrium: Left atrial size was normal in size. Right Atrium: Right atrial size was normal in size. Pericardium: There is no evidence of pericardial effusion. Mitral Valve: The mitral  valve is grossly normal. Trivial mitral valve regurgitation. Tricuspid Valve: The tricuspid valve is normal in structure. Tricuspid valve regurgitation is trivial. Aortic Valve: The aortic valve is normal in structure. Aortic valve regurgitation is not visualized. Aortic valve mean gradient measures 20.7 mmHg. Aortic valve peak gradient measures 35.8 mmHg. Aortic valve area, by VTI measures 0.67 cm. Pulmonic Valve: The pulmonic valve was normal in structure. Pulmonic valve regurgitation is not visualized. Aorta: The ascending aorta was not well visualized. IAS/Shunts: No atrial level shunt detected by color flow Doppler. Additional Comments: Ant/septal/apical hypo.  LEFT VENTRICLE PLAX 2D LVIDd:         6.05 cm      Diastology LVIDs:         4.75 cm      LV e' medial:    5.11 cm/s LV PW:         1.10 cm      LV E/e' medial:  23.5 LV IVS:        1.35 cm      LV e' lateral:   8.70 cm/s LVOT diam:     2.20 cm      LV E/e' lateral: 13.8 LV SV:         37 LV SV Index:   17 LVOT Area:     3.80 cm  LV Volumes (MOD) LV vol d, MOD A2C: 136.0 ml LV vol d, MOD A4C: 131.0 ml LV vol s, MOD A2C: 74.9 ml LV vol s, MOD A4C: 71.2 ml LV SV MOD A2C:     61.1 ml LV SV MOD A4C:     131.0 ml LV SV MOD BP:      62.5 ml LEFT ATRIUM           Index LA diam:      3.90 cm 1.84 cm/m LA Vol (A2C): 43.0 ml 20.34 ml/m LA Vol (A4C): 60.8 ml 28.76 ml/m  AORTIC VALVE                     PULMONIC VALVE AV Area (Vmax):    0.70 cm      PV  Vmax:       1.70 m/s AV Area (Vmean):   0.62 cm      PV Peak grad:  11.6 mmHg AV Area (VTI):     0.67 cm AV Vmax:           299.00 cm/s AV Vmean:          209.333 cm/s AV VTI:            0.550 m AV Peak Grad:      35.8 mmHg AV Mean Grad:      20.7 mmHg LVOT Vmax:         54.80 cm/s LVOT Vmean:        34.200 cm/s LVOT VTI:          0.096 m LVOT/AV VTI ratio: 0.18  AORTA Ao Root diam: 3.50 cm MITRAL VALVE                TRICUSPID VALVE MV Area (PHT): 5.97 cm     TV Peak grad:   14.3 mmHg MV Decel Time: 127 msec      TV Vmax:        1.89 m/s MV E velocity: 120.00 cm/s MV A velocity: 81.80 cm/s   SHUNTS MV E/A ratio:  1.47         Systemic VTI:  0.10 m                             Systemic Diam: 2.20 cm Dwayne D Callwood MD Electronically signed by Yolonda Kida MD Signature Date/Time: 09/16/2021/12:00:46 AM    Final         Scheduled Meds:  aspirin EC  81 mg Oral Daily   diltiazem  30 mg Oral Q6H   insulin aspart  0-15 Units Subcutaneous TID WC   insulin aspart  0-5 Units Subcutaneous QHS   insulin glargine-yfgn  20 Units Subcutaneous BID   metoprolol tartrate  25 mg Oral BID   multivitamin-lutein  1 capsule Oral Daily   omega-3 acid ethyl esters  1 g Oral QHS   pravastatin  80 mg Oral QHS   sodium chloride flush  3 mL Intravenous Q12H   Continuous Infusions:  heparin 1,450 Units/hr (09/16/21 0727)     LOS: 2 days    Time spent: 25 minutes    Sidney Ace, MD [facility]   If 7PM-7AM, please contact night-coverage  09/16/2021, 11:26 AM

## 2021-09-16 NOTE — Progress Notes (Signed)
[facility] Cardiology    SUBJECTIVE: Patient feels reasonably well denies chest pain no shortness of breath prepared for cardiac cath tomorrow   Vitals:   09/15/21 2125 09/15/21 2219 09/15/21 2332 09/16/21 0328  BP:  (!) 117/56 111/61 111/67  Pulse:  77 70 62  Resp:  18 17 16   Temp:  98.4 F (36.9 C) 98.1 F (36.7 C) 97.9 F (36.6 C)  TempSrc:  Oral    SpO2:  93% 95% 97%  Weight: 98.5 kg     Height: 5\' 7"  (1.702 m)        Intake/Output Summary (Last 24 hours) at 09/16/2021 0750 Last data filed at 09/16/2021 0017 Gross per 24 hour  Intake --  Output 650 ml  Net -650 ml      PHYSICAL EXAM  General: Well developed, well nourished, in no acute distress HEENT:  Normocephalic and atramatic Neck:  No JVD.  Lungs: Clear bilaterally to auscultation and percussion. Heart: HRRR . Normal S1 and S2 without gallops or murmurs.  Abdomen: Bowel sounds are positive, abdomen soft and non-tender  Msk:  Back normal, normal gait. Normal strength and tone for age. Extremities: No clubbing, cyanosis or edema.   Neuro: Alert and oriented X 3. Psych:  Good affect, responds appropriately   LABS: Basic Metabolic Panel: Recent Labs    09/14/21 1434 09/15/21 0619  NA 135 135  K 4.3 4.0  CL 101 102  CO2 25 23  GLUCOSE 113* 132*  BUN 15 17  CREATININE 0.90 1.01  CALCIUM 9.0 8.7*   Liver Function Tests: Recent Labs    09/14/21 1434  AST 35  ALT 17  ALKPHOS 68  BILITOT 0.7  PROT 7.4  ALBUMIN 3.7   No results for input(s): LIPASE, AMYLASE in the last 72 hours. CBC: Recent Labs    09/15/21 0619 09/16/21 0532  WBC 13.6* 11.8*  HGB 10.6* 10.0*  HCT 33.7* 31.8*  MCV 74.6* 73.6*  PLT 323 301   Cardiac Enzymes: No results for input(s): CKTOTAL, CKMB, CKMBINDEX, TROPONINI in the last 72 hours. BNP: Invalid input(s): POCBNP D-Dimer: No results for input(s): DDIMER in the last 72 hours. Hemoglobin A1C: No results for input(s): HGBA1C in the last 72 hours. Fasting Lipid  Panel: Recent Labs    09/15/21 0619  CHOL 113  HDL 28*  LDLCALC 43  TRIG 210*  CHOLHDL 4.0   Thyroid Function Tests: Recent Labs    09/14/21 1857  TSH 2.782   Anemia Panel: No results for input(s): VITAMINB12, FOLATE, FERRITIN, TIBC, IRON, RETICCTPCT in the last 72 hours.  DG Chest Port 1 View  Result Date: 09/14/2021 CLINICAL DATA:  Chest pain EXAM: PORTABLE CHEST 1 VIEW COMPARISON:  02/17/2021 FINDINGS: Cardiomegaly. Both lungs are clear. The visualized skeletal structures are unremarkable. IMPRESSION: Cardiomegaly without acute abnormality of the lungs in AP portable projection. Electronically Signed   By: Delanna Ahmadi M.D.   On: 09/14/2021 16:52   ECHOCARDIOGRAM COMPLETE  Result Date: 09/16/2021    ECHOCARDIOGRAM REPORT   Patient Name:   Harry [name] Date of Exam: 09/15/2021 Medical Rec #:  [id number]         Height:       67.0 in Accession #:    [id number]        Weight:       222.0 lb Date of Birth:  Aug 02, 1941          BSA:          2.114 m Patient  Age:    80 years          BP:           92/63 mmHg Patient Gender: M                 HR:           69 bpm. Exam Location:  [facility] Procedure: 2D Echo and Intracardiac Opacification Agent Indications:     Chest Pain R07.9  History:         Patient has prior history of Echocardiogram examinations, most                  recent 04/02/2021.  Sonographer:     Overton Mam RDCS Referring Phys:  [id number] SARA-MAIZ A THOMAS Diagnosing Phys: Alwyn Pea MD  Sonographer Comments: Suboptimal apical window and suboptimal subcostal window. Image acquisition challenging due to patient body habitus. IMPRESSIONS  1. Ant/septal/apical hypo.  2. Left ventricular ejection fraction, by estimation, is 40 to 45%. The left ventricle has mildly decreased function. The left ventricle demonstrates regional wall motion abnormalities (see scoring diagram/findings for description). The left ventricular  internal cavity size was moderately to severely dilated.  Left ventricular diastolic parameters are consistent with Grade II diastolic dysfunction (pseudonormalization).  3. Right ventricular systolic function is low normal. The right ventricular size is mildly enlarged.  4. The mitral valve is grossly normal. Trivial mitral valve regurgitation.  5. The aortic valve is normal in structure. Aortic valve regurgitation is not visualized. FINDINGS  Left Ventricle: Left ventricular ejection fraction, by estimation, is 40 to 45%. The left ventricle has mildly decreased function. The left ventricle demonstrates regional wall motion abnormalities. Definity contrast agent was given IV to delineate the left ventricular endocardial borders. The left ventricular internal cavity size was moderately to severely dilated. There is no left ventricular hypertrophy. Left ventricular diastolic parameters are consistent with Grade II diastolic dysfunction (pseudonormalization). Right Ventricle: The right ventricular size is mildly enlarged. No increase in right ventricular wall thickness. Right ventricular systolic function is low normal. Left Atrium: Left atrial size was normal in size. Right Atrium: Right atrial size was normal in size. Pericardium: There is no evidence of pericardial effusion. Mitral Valve: The mitral valve is grossly normal. Trivial mitral valve regurgitation. Tricuspid Valve: The tricuspid valve is normal in structure. Tricuspid valve regurgitation is trivial. Aortic Valve: The aortic valve is normal in structure. Aortic valve regurgitation is not visualized. Aortic valve mean gradient measures 20.7 mmHg. Aortic valve peak gradient measures 35.8 mmHg. Aortic valve area, by VTI measures 0.67 cm. Pulmonic Valve: The pulmonic valve was normal in structure. Pulmonic valve regurgitation is not visualized. Aorta: The ascending aorta was not well visualized. IAS/Shunts: No atrial level shunt detected by color flow Doppler. Additional Comments: Ant/septal/apical hypo.  LEFT  VENTRICLE PLAX 2D LVIDd:         6.05 cm      Diastology LVIDs:         4.75 cm      LV e' medial:    5.11 cm/s LV PW:         1.10 cm      LV E/e' medial:  23.5 LV IVS:        1.35 cm      LV e' lateral:   8.70 cm/s LVOT diam:     2.20 cm      LV E/e' lateral: 13.8 LV SV:  37 LV SV Index:   17 LVOT Area:     3.80 cm  LV Volumes (MOD) LV vol d, MOD A2C: 136.0 ml LV vol d, MOD A4C: 131.0 ml LV vol s, MOD A2C: 74.9 ml LV vol s, MOD A4C: 71.2 ml LV SV MOD A2C:     61.1 ml LV SV MOD A4C:     131.0 ml LV SV MOD BP:      62.5 ml LEFT ATRIUM           Index LA diam:      3.90 cm 1.84 cm/m LA Vol (A2C): 43.0 ml 20.34 ml/m LA Vol (A4C): 60.8 ml 28.76 ml/m  AORTIC VALVE                     PULMONIC VALVE AV Area (Vmax):    0.70 cm      PV Vmax:       1.70 m/s AV Area (Vmean):   0.62 cm      PV Peak grad:  11.6 mmHg AV Area (VTI):     0.67 cm AV Vmax:           299.00 cm/s AV Vmean:          209.333 cm/s AV VTI:            0.550 m AV Peak Grad:      35.8 mmHg AV Mean Grad:      20.7 mmHg LVOT Vmax:         54.80 cm/s LVOT Vmean:        34.200 cm/s LVOT VTI:          0.096 m LVOT/AV VTI ratio: 0.18  AORTA Ao Root diam: 3.50 cm MITRAL VALVE                TRICUSPID VALVE MV Area (PHT): 5.97 cm     TV Peak grad:   14.3 mmHg MV Decel Time: 127 msec     TV Vmax:        1.89 m/s MV E velocity: 120.00 cm/s MV A velocity: 81.80 cm/s   SHUNTS MV E/A ratio:  1.47         Systemic VTI:  0.10 m                             Systemic Diam: 2.20 cm Jalyn Dutta D Willamina Grieshop MD Electronically signed by Yolonda Kida MD Signature Date/Time: 09/16/2021/12:00:46 AM    Final      Echo mildly depressed left ventricular function EF 40 to 45%   TELEMETRY: Paced rhythm at 90:  ASSESSMENT AND PLAN:  Principal Problem:   NSTEMI (non-ST elevated myocardial infarction) [facility]) Active Problems:   Atrial fibrillation with RVR (HCC) Sick sinus syn 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

## 2021-09-16 NOTE — Progress Notes (Signed)
Patient has refused cpap. States he does wear one at home off and on. Will have RN to call if changes mind

## 2021-09-17 ENCOUNTER — Encounter
Admission: EM | Disposition: A | Discharge status: Other Acute Inpatient Hospital | Payer: Self-pay | Source: Home / Self Care | Attending: Internal Medicine

## 2021-09-17 ENCOUNTER — Encounter: Payer: Self-pay | Admitting: Cardiology

## 2021-09-17 DIAGNOSIS — I214 Non-ST elevation (NSTEMI) myocardial infarction: Secondary | ICD-10-CM | POA: Diagnosis not present

## 2021-09-17 HISTORY — PX: LEFT HEART CATH AND CORONARY ANGIOGRAPHY: CATH118249

## 2021-09-17 LAB — CBC
HCT: 29.9 % — ABNORMAL LOW (ref 39.0–52.0)
Hemoglobin: 9.4 g/dL — ABNORMAL LOW (ref 13.0–17.0)
MCH: 23.5 pg — ABNORMAL LOW (ref 26.0–34.0)
MCHC: 31.4 g/dL (ref 30.0–36.0)
MCV: 74.8 fL — ABNORMAL LOW (ref 80.0–100.0)
Platelets: 272 10*3/uL (ref 150–400)
RBC: 4 MIL/uL — ABNORMAL LOW (ref 4.22–5.81)
RDW: 17.6 % — ABNORMAL HIGH (ref 11.5–15.5)
WBC: 10.6 10*3/uL — ABNORMAL HIGH (ref 4.0–10.5)
nRBC: 0 % (ref 0.0–0.2)

## 2021-09-17 LAB — APTT
aPTT: 79 seconds — ABNORMAL HIGH (ref 24–36)
aPTT: 58 seconds — ABNORMAL HIGH (ref 24–36)

## 2021-09-17 LAB — HEMOGLOBIN A1C
Hgb A1c MFr Bld: 6.5 % — ABNORMAL HIGH (ref 4.8–5.6)
Hgb A1c MFr Bld: 6.5 % — ABNORMAL HIGH (ref 4.8–5.6)
Mean Plasma Glucose: 140 mg/dL
Mean Plasma Glucose: 140 mg/dL

## 2021-09-17 LAB — GLUCOSE, CAPILLARY
Glucose-Capillary: 136 mg/dL — ABNORMAL HIGH (ref 70–99)
Glucose-Capillary: 136 mg/dL — ABNORMAL HIGH (ref 70–99)
Glucose-Capillary: 140 mg/dL — ABNORMAL HIGH (ref 70–99)
Glucose-Capillary: 126 mg/dL — ABNORMAL HIGH (ref 70–99)
Glucose-Capillary: 170 mg/dL — ABNORMAL HIGH (ref 70–99)
Glucose-Capillary: 179 mg/dL — ABNORMAL HIGH (ref 70–99)

## 2021-09-17 LAB — HEPARIN LEVEL (UNFRACTIONATED)
Heparin Unfractionated: 0.46 IU/mL (ref 0.30–0.70)
Heparin Unfractionated: 0.32 IU/mL (ref 0.30–0.70)

## 2021-09-17 SURGERY — LEFT HEART CATH AND CORONARY ANGIOGRAPHY
Anesthesia: Moderate Sedation

## 2021-09-17 MED ORDER — HEPARIN (PORCINE) IN NACL 1000-0.9 UT/500ML-% IV SOLN
INTRAVENOUS | Status: AC
  Filled 2021-09-17: qty 1000

## 2021-09-17 MED ORDER — HEPARIN SODIUM (PORCINE) 1000 UNIT/ML IJ SOLN
INTRAMUSCULAR | Status: AC
  Filled 2021-09-17: qty 10

## 2021-09-17 MED ORDER — ACETAMINOPHEN 325 MG PO TABS: 650.0000 mg | ORAL_TABLET | ORAL | Status: DC | PRN

## 2021-09-17 MED ORDER — METOPROLOL TARTRATE 25 MG PO TABS: 25.0000 mg | ORAL_TABLET | Freq: Two times a day (BID) | ORAL | Status: DC

## 2021-09-17 MED ORDER — SODIUM CHLORIDE 0.9 % WEIGHT BASED INFUSION: 1.0000 mL/kg/h | INTRAVENOUS | Status: AC

## 2021-09-17 MED ORDER — HEPARIN BOLUS VIA INFUSION
2300.0000 [IU] | Freq: Once | INTRAVENOUS | Status: AC
  Administered 2021-09-17: 19:00:00 2300 [IU] via INTRAVENOUS
  Filled 2021-09-17: qty 2300

## 2021-09-17 MED ORDER — SODIUM CHLORIDE 0.9% FLUSH
3.0000 mL | Freq: Two times a day (BID) | INTRAVENOUS | Status: DC
  Administered 2021-09-17: 20:00:00 3 mL via INTRAVENOUS

## 2021-09-17 MED ORDER — LABETALOL HCL 5 MG/ML IV SOLN: 10.0000 mg | INTRAVENOUS | Status: AC | PRN

## 2021-09-17 MED ORDER — SODIUM CHLORIDE 0.9 % IV SOLN: 250.0000 mL | INTRAVENOUS | Status: DC | PRN

## 2021-09-17 MED ORDER — DILTIAZEM HCL 30 MG PO TABS: 30.0000 mg | ORAL_TABLET | Freq: Four times a day (QID) | ORAL | Status: DC

## 2021-09-17 MED ORDER — MIDAZOLAM HCL 2 MG/2ML IJ SOLN
INTRAMUSCULAR | Status: DC | PRN
  Administered 2021-09-17: 1 mg via INTRAVENOUS

## 2021-09-17 MED ORDER — ONDANSETRON HCL 4 MG/2ML IJ SOLN: 4.0000 mg | Freq: Four times a day (QID) | INTRAMUSCULAR | Status: DC | PRN

## 2021-09-17 MED ORDER — LIDOCAINE HCL (PF) 1 % IJ SOLN
INTRAMUSCULAR | Status: DC | PRN
  Administered 2021-09-17: 2 mL

## 2021-09-17 MED ORDER — MIDAZOLAM HCL 2 MG/2ML IJ SOLN
INTRAMUSCULAR | Status: AC
  Filled 2021-09-17: qty 2

## 2021-09-17 MED ORDER — IOHEXOL 300 MG/ML  SOLN
INTRAMUSCULAR | Status: DC | PRN
  Administered 2021-09-17: 09:00:00 120 mL

## 2021-09-17 MED ORDER — HEPARIN SODIUM (PORCINE) 1000 UNIT/ML IJ SOLN
INTRAMUSCULAR | Status: DC | PRN
  Administered 2021-09-17: 5000 [IU] via INTRAVENOUS

## 2021-09-17 MED ORDER — LIDOCAINE HCL 1 % IJ SOLN
INTRAMUSCULAR | Status: AC
  Filled 2021-09-17: qty 20

## 2021-09-17 MED ORDER — FENTANYL CITRATE (PF) 100 MCG/2ML IJ SOLN
INTRAMUSCULAR | Status: DC | PRN
  Administered 2021-09-17: 25 ug via INTRAVENOUS

## 2021-09-17 MED ORDER — VERAPAMIL HCL 2.5 MG/ML IV SOLN
INTRAVENOUS | Status: AC
  Filled 2021-09-17: qty 2

## 2021-09-17 MED ORDER — SODIUM CHLORIDE 0.9% FLUSH: 3.0000 mL | INTRAVENOUS | Status: DC | PRN

## 2021-09-17 MED ORDER — VERAPAMIL HCL 2.5 MG/ML IV SOLN
INTRAVENOUS | Status: DC | PRN
  Administered 2021-09-17: 2.5 mg via INTRA_ARTERIAL

## 2021-09-17 MED ORDER — FENTANYL CITRATE (PF) 100 MCG/2ML IJ SOLN
INTRAMUSCULAR | Status: AC
  Filled 2021-09-17: qty 2

## 2021-09-17 MED ORDER — HEPARIN (PORCINE) IN NACL 1000-0.9 UT/500ML-% IV SOLN
INTRAVENOUS | Status: DC | PRN
  Administered 2021-09-17: 1000 mL

## 2021-09-17 MED ORDER — HYDRALAZINE HCL 20 MG/ML IJ SOLN: 10.0000 mg | INTRAMUSCULAR | Status: AC | PRN

## 2021-09-17 SURGICAL SUPPLY — 12 items
CATH 5FR JL3.5 JR4 ANG PIG MP (CATHETERS) ×2 IMPLANT
DEVICE RAD TR BAND REGULAR (VASCULAR PRODUCTS) ×2 IMPLANT
DRAPE BRACHIAL (DRAPES) ×2 IMPLANT
GLIDESHEATH SLEND SS 6F .021 (SHEATH) ×2 IMPLANT
GUIDEWIRE INQWIRE 1.5J.035X260 (WIRE) IMPLANT
INQWIRE 1.5J .035X260CM (WIRE) ×3
PACK CARDIAC CATH (CUSTOM PROCEDURE TRAY) ×3 IMPLANT
PANNUS RETENTION SYSTEM 2 PAD (MISCELLANEOUS) ×2 IMPLANT
PROTECTION STATION PRESSURIZED (MISCELLANEOUS) ×3
SET ATX SIMPLICITY (MISCELLANEOUS) ×2 IMPLANT
STATION PROTECTION PRESSURIZED (MISCELLANEOUS) IMPLANT
WIRE GUIDERIGHT .035X150 (WIRE) ×2 IMPLANT

## 2021-09-17 NOTE — Progress Notes (Signed)
[facility] here to transfer this pt, recent VS taken, VSS. Discharge paperwork given to EMS.

## 2021-09-17 NOTE — Discharge Summary (Signed)
Physician Discharge Summary  Harry [name] IWL:[id number] DOB: 01-28-1941 DOA: 09/14/2021  PCP: Kandyce Rud, MD  Admit date: 09/14/2021 Discharge date: 09/17/2021  Admitted From: Home Disposition: Transferred to [facility] for CABG evaluation  Recommendations for Outpatient Follow-up:  N/A   Discharge Condition: Stable CODE STATUS: Full Diet recommendation: Heart healthy  Brief/Interim Summary:  80 y.o. 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 Patient states it occurred [place] he walked [place] post [place]. He notes he took nitro x 2 w/o any improvement in symptoms. He notes chest pain was left side  and described as sharp and pressure like. He noted no dizziness, no sob, no nausea no radiation of pain. He notes pain lasted for about 45 minutess. He was given asa by EMS and per patient pain resolved prior to arrival in ED. Patient notes that he does not get frequent chest discomfort but states he leads a very sedentary life style. He denies any cough/fever/chills/abdominal pain/dysuria or hematuria. On evaluation in ed patient found to have NSTEMI with ce [phone], patient also noted to be in afib with rvr to 130. Cardiology was consulted and patient was started on heparin drip as well as ccb drip.   Per cardiology patient scheduled for left heart catheterization with Dr. Darrold [name] on 12/27.  Weaned off diltiazem gtt.  Now on p.o. diltiazem 30 mg every 6 hours.  On 12/27 patient underwent cardiac catheterization with interventional cardiology.  Discussed with cardiology.  Patient with severe multivessel coronary disease.  Not amenable to PCI.  Will transfer to [facility] for CABG evaluation.  Accepting cardiothoracic surgeon Harry [name].      Discharge  Diagnoses:  Principal Problem:   NSTEMI (non-ST elevated myocardial infarction) [facility]) Active Problems:   Atrial fibrillation with RVR (HCC)  NSTEMI cardiology on consult since admission.  Patient was maintained on heparin GTT for 48 hours.  Underwent left heart catheterization on 12/27.  Severe multivessel coronary artery disease.  Discussed interventional cardiology.  Patient will transfer to [facility] for CABG evaluation.  Accepting surgeon Harry [name].  Atrial fibrillation with rapid ventricular response Likely exacerbated by NSTEMI Rate improved Weaned off diltiazem drip P.o. diltiazem started per cardiology Will continue p.o. diltiazem per cardiology recommendations at time of discharge.  Home Eliquis has been resumed on home MAR.    Discharge Instructions  Discharge Instructions     Diet - low sodium heart healthy   Complete by: As directed    Increase activity slowly   Complete by: As directed    No wound care   Complete by: As directed       Allergies as of 09/17/2021       Reactions   Atorvastatin    Other reaction(s): Muscle Pain   Bupropion Other (See Comments)   Other reaction(s): Dizziness CNS side effects   Citalopram    Other reaction(s): Other (See Comments) GI side effects   Pioglitazone    Other reaction(s): Muscle Pain        Medication List     STOP taking these medications    doxycycline 100 MG tablet Commonly known as: VIBRA-TABS       TAKE these medications    ASPIRIN 81 PO Take 81 mg by mouth  daily.   diltiazem 30 MG tablet Commonly known as: CARDIZEM Take 1 tablet (30 mg total) by mouth every 6 (six) hours.   Eliquis 5 MG Tabs tablet Generic drug: apixaban Take 1 tablet (5 mg total) by mouth 2 (two) times daily.   insulin NPH-regular Human (70-30) 100 UNIT/ML injection Inject 40-60 Units into the skin See admin instructions. Inject 40 units in the morning and 60 units in the evening.   metFORMIN 1000 MG  tablet Commonly known as: GLUCOPHAGE Take 1,000 mg by mouth 2 (two) times daily with a meal.   metoprolol tartrate 25 MG tablet Commonly known as: LOPRESSOR Take 1 tablet (25 mg total) by mouth 2 (two) times daily.   nitroGLYCERIN 0.4 MG SL tablet Commonly known as: NITROSTAT Place 0.4 mg under the tongue every 5 (five) minutes as needed for chest pain.   Omega-3 1000 MG Caps Take 1,000 mg by mouth every evening.   pravastatin 20 MG tablet Commonly known as: PRAVACHOL Take 20 mg by mouth daily.   PRESERVISION/LUTEIN PO Take 1 capsule by mouth daily.   Trulicity 1.5 0000000 Sopn Generic drug: Dulaglutide Inject 1.5 mg into the skin See admin instructions. Every 10 days        Allergies  Allergen Reactions   Atorvastatin     Other reaction(s): Muscle Pain   Bupropion Other (See Comments)    Other reaction(s): Dizziness CNS side effects   Citalopram     Other reaction(s): Other (See Comments) GI side effects   Pioglitazone     Other reaction(s): Muscle Pain    Consultations: Cardiology-[facility]   Procedures/Studies: CARDIAC CATHETERIZATION  Result Date: 09/17/2021   Prox RCA-1 lesion is 80% stenosed.   Prox RCA-2 lesion is 40% stenosed.   RPDA lesion is 60% stenosed.   Mid LM lesion is 70% stenosed.   Prox Cx to Mid Cx lesion is 90% stenosed.   Prox LAD to Mid LAD lesion is 100% stenosed.   Prox LAD lesion is 99% stenosed.   Previously placed Dist RCA stent (unknown type) is  widely patent.   There is moderate left ventricular systolic dysfunction.   The left ventricular ejection fraction is 35-45% by visual estimate. 1.  Non-ST elevation myocardial infarction 2.  Severe three-vessel coronary artery disease with occluded mid LAD, 80% ulcerated stenosis mid left circumflex, 80% stenosis proximal RCA (culprit lesion unclear ) 3.  Moderate reduced left ventricular function with estimated LV ejection fraction 35 to 40% with apical dyskinesis Recommendations  Transfer to [facility] for CABG   DG Chest Port 1 View  Result Date: 09/14/2021 CLINICAL DATA:  Chest pain EXAM: PORTABLE CHEST 1 VIEW COMPARISON:  02/17/2021 FINDINGS: Cardiomegaly. Both lungs are clear. The visualized skeletal structures are unremarkable. IMPRESSION: Cardiomegaly without acute abnormality of the lungs in AP portable projection. Electronically Signed   By: Delanna Ahmadi M.D.   On: 09/14/2021 16:52   ECHOCARDIOGRAM COMPLETE  Result Date: 09/16/2021    ECHOCARDIOGRAM REPORT   Patient Name:   Harry [name] Date of Exam: 09/15/2021 Medical Rec #:  [id number]         Height:       67.0 in Accession #:    [id number]        Weight:       222.0 lb Date of Birth:  11-12-1940          BSA:          2.114 m Patient Age:    62  years          BP:           92/63 mmHg Patient Gender: M                 HR:           69 bpm. Exam Location:  [facility] Procedure: 2D Echo and Intracardiac Opacification Agent Indications:     Chest Pain R07.9  History:         Patient has prior history of Echocardiogram examinations, most                  recent 04/02/2021.  Sonographer:     Kathlen Brunswick RDCS Referring Phys:  [id number] SARA-MAIZ A THOMAS Diagnosing Phys: Yolonda Kida MD  Sonographer Comments: Suboptimal apical window and suboptimal subcostal window. Image acquisition challenging due to patient body habitus. IMPRESSIONS  1. Ant/septal/apical hypo.  2. Left ventricular ejection fraction, by estimation, is 40 to 45%. The left ventricle has mildly decreased function. The left ventricle demonstrates regional wall motion abnormalities (see scoring diagram/findings for description). The left ventricular  internal cavity size was moderately to severely dilated. Left ventricular diastolic parameters are consistent with Grade II diastolic dysfunction (pseudonormalization).  3. Right ventricular systolic function is low normal. The right ventricular size is mildly enlarged.  4. The mitral valve is grossly normal. Trivial  mitral valve regurgitation.  5. The aortic valve is normal in structure. Aortic valve regurgitation is not visualized. FINDINGS  Left Ventricle: Left ventricular ejection fraction, by estimation, is 40 to 45%. The left ventricle has mildly decreased function. The left ventricle demonstrates regional wall motion abnormalities. Definity contrast agent was given IV to delineate the left ventricular endocardial borders. The left ventricular internal cavity size was moderately to severely dilated. There is no left ventricular hypertrophy. Left ventricular diastolic parameters are consistent with Grade II diastolic dysfunction (pseudonormalization). Right Ventricle: The right ventricular size is mildly enlarged. No increase in right ventricular wall thickness. Right ventricular systolic function is low normal. Left Atrium: Left atrial size was normal in size. Right Atrium: Right atrial size was normal in size. Pericardium: There is no evidence of pericardial effusion. Mitral Valve: The mitral valve is grossly normal. Trivial mitral valve regurgitation. Tricuspid Valve: The tricuspid valve is normal in structure. Tricuspid valve regurgitation is trivial. Aortic Valve: The aortic valve is normal in structure. Aortic valve regurgitation is not visualized. Aortic valve mean gradient measures 20.7 mmHg. Aortic valve peak gradient measures 35.8 mmHg. Aortic valve area, by VTI measures 0.67 cm. Pulmonic Valve: The pulmonic valve was normal in structure. Pulmonic valve regurgitation is not visualized. Aorta: The ascending aorta was not well visualized. IAS/Shunts: No atrial level shunt detected by color flow Doppler. Additional Comments: Ant/septal/apical hypo.  LEFT VENTRICLE PLAX 2D LVIDd:         6.05 cm      Diastology LVIDs:         4.75 cm      LV e' medial:    5.11 cm/s LV PW:         1.10 cm      LV E/e' medial:  23.5 LV IVS:        1.35 cm      LV e' lateral:   8.70 cm/s LVOT diam:     2.20 cm      LV E/e' lateral: 13.8  LV SV:         37 LV SV Index:  17 LVOT Area:     3.80 cm  LV Volumes (MOD) LV vol d, MOD A2C: 136.0 ml LV vol d, MOD A4C: 131.0 ml LV vol s, MOD A2C: 74.9 ml LV vol s, MOD A4C: 71.2 ml LV SV MOD A2C:     61.1 ml LV SV MOD A4C:     131.0 ml LV SV MOD BP:      62.5 ml LEFT ATRIUM           Index LA diam:      3.90 cm 1.84 cm/m LA Vol (A2C): 43.0 ml 20.34 ml/m LA Vol (A4C): 60.8 ml 28.76 ml/m  AORTIC VALVE                     PULMONIC VALVE AV Area (Vmax):    0.70 cm      PV Vmax:       1.70 m/s AV Area (Vmean):   0.62 cm      PV Peak grad:  11.6 mmHg AV Area (VTI):     0.67 cm AV Vmax:           299.00 cm/s AV Vmean:          209.333 cm/s AV VTI:            0.550 m AV Peak Grad:      35.8 mmHg AV Mean Grad:      20.7 mmHg LVOT Vmax:         54.80 cm/s LVOT Vmean:        34.200 cm/s LVOT VTI:          0.096 m LVOT/AV VTI ratio: 0.18  AORTA Ao Root diam: 3.50 cm MITRAL VALVE                TRICUSPID VALVE MV Area (PHT): 5.97 cm     TV Peak grad:   14.3 mmHg MV Decel Time: 127 msec     TV Vmax:        1.89 m/s MV E velocity: 120.00 cm/s MV A velocity: 81.80 cm/s   SHUNTS MV E/A ratio:  1.47         Systemic VTI:  0.10 m                             Systemic Diam: 2.20 cm Dwayne Prince Rome MD Electronically signed by Yolonda Kida MD Signature Date/Time: 09/16/2021/12:00:46 AM    Final       Subjective: Seen and examined the day of transfer.  Stable.  Chest pain-free.  Discharge Exam: Vitals:   09/17/21 1030 09/17/21 1100  BP: 137/90 (!) 131/59  Pulse: 72 66  Resp: 20 12  Temp:    SpO2: 94% 95%   Vitals:   09/17/21 0945 09/17/21 1015 09/17/21 1030 09/17/21 1100  BP: 139/67  137/90 (!) 131/59  Pulse: 69 66 72 66  Resp: 20 12 20 12   Temp:      TempSrc:      SpO2: 91% 93% 94% 95%  Weight:      Height:        General: Pt is alert, awake, not in acute distress Cardiovascular: RRR, S1/S2 +, no rubs, no gallops Respiratory: CTA bilaterally, no wheezing, no rhonchi Abdominal: Soft, NT,  ND, bowel sounds + Extremities: no edema, no cyanosis    The results of significant diagnostics from this hospitalization (including imaging, microbiology, ancillary and laboratory)  are listed below for reference.     Microbiology: Recent Results (from the past 240 hour(s))  Resp Panel by RT-PCR (Flu A&B, Covid) Nasopharyngeal Swab     Status: None   Collection Time: 09/14/21  6:56 PM   Specimen: Nasopharyngeal Swab; Nasopharyngeal(NP) swabs in vial transport medium  Result Value Ref Range Status   SARS Coronavirus 2 by RT PCR NEGATIVE NEGATIVE Final    Comment: (NOTE) SARS-CoV-2 target nucleic acids are NOT DETECTED.  The SARS-CoV-2 RNA is generally detectable in upper respiratory specimens during the acute phase of infection. The lowest concentration of SARS-CoV-2 viral copies this assay can detect is 138 copies/mL. A negative result does not preclude SARS-Cov-2 infection and should not be used as the sole basis for treatment or other patient management decisions. A negative result may occur with  improper specimen collection/handling, submission of specimen other than nasopharyngeal swab, presence of viral mutation(s) within the areas targeted by this assay, and inadequate number of viral copies(<138 copies/mL). A negative result must be combined with clinical observations, patient history, and epidemiological information. The expected result is Negative.  Fact Sheet for Patients:  EntrepreneurPulse.com.au  Fact Sheet for Healthcare Providers:  IncredibleEmployment.be  This test is no t yet approved or cleared by the [place] FDA and  has been authorized for detection and/or diagnosis of SARS-CoV-2 by FDA under an Emergency Use Authorization (EUA). This EUA will remain  in effect (meaning this test can be used) for the duration of the COVID-19 declaration under Section 564(b)(1) of the Act, 21 U.S.C.section 360bbb-3(b)(1), unless  the authorization is terminated  or revoked sooner.       Influenza A by PCR NEGATIVE NEGATIVE Final   Influenza B by PCR NEGATIVE NEGATIVE Final    Comment: (NOTE) The Xpert Xpress SARS-CoV-2/FLU/RSV plus assay is intended as an aid in the diagnosis of influenza from Nasopharyngeal swab specimens and should not be used as a sole basis for treatment. Nasal washings and aspirates are unacceptable for Xpert Xpress SARS-CoV-2/FLU/RSV testing.  Fact Sheet for Patients: EntrepreneurPulse.com.au  Fact Sheet for Healthcare Providers: IncredibleEmployment.be  This test is not yet approved or cleared by the [place] FDA and has been authorized for detection and/or diagnosis of SARS-CoV-2 by FDA under an Emergency Use Authorization (EUA). This EUA will remain in effect (meaning this test can be used) for the duration of the COVID-19 declaration under Section 564(b)(1) of the Act, 21 U.S.C. section 360bbb-3(b)(1), unless the authorization is terminated or revoked.  Performed at [facility], [address] [place], [place] [address]      Labs: BNP (last 3 results) Recent Labs    02/17/21 1551 04/02/21 0834 09/14/21 1856  BNP 36.7 333.4* [id number]*   Basic Metabolic Panel: Recent Labs  Lab 09/14/21 1434 09/15/21 0619 09/16/21 0941  NA 135 135 136  K 4.3 4.0 3.8  CL 101 102 104  CO2 25 23 24   GLUCOSE 113* 132* 203*  BUN 15 17 14   CREATININE 0.90 1.01 0.99  CALCIUM 9.0 8.7* 8.4*  MG  --   --  1.9   Liver Function Tests: Recent Labs  Lab 09/14/21 1434  AST 35  ALT 17  ALKPHOS 68  BILITOT 0.7  PROT 7.4  ALBUMIN 3.7   No results for input(s): LIPASE, AMYLASE in the last 168 hours. No results for input(s): AMMONIA in the last 168 hours. CBC: Recent Labs  Lab 09/14/21 1434 09/15/21 0619 09/16/21 0532 09/17/21 0517  WBC 11.3* 13.6*  11.8* 10.6*  HGB 10.8* 10.6* 10.0* 9.4*  HCT 34.7* 33.7* 31.8* 29.9*  MCV 76.3*  74.6* 73.6* 74.8*  PLT 292 323 301 272   Cardiac Enzymes: No results for input(s): CKTOTAL, CKMB, CKMBINDEX, TROPONINI in the last 168 hours. BNP: Invalid input(s): POCBNP CBG: Recent Labs  Lab 09/16/21 1623 09/16/21 2126 09/17/21 0441 09/17/21 0736 09/17/21 0815  GLUCAP 144* 185* 136* 140* 136*   D-Dimer No results for input(s): DDIMER in the last 72 hours. Hgb A1c Recent Labs    09/14/21 1857 09/15/21 1531  HGBA1C 6.5* 6.5*   Lipid Profile Recent Labs    09/15/21 0619  CHOL 113  HDL 28*  LDLCALC 43  TRIG 210*  CHOLHDL 4.0   Thyroid function studies Recent Labs    09/14/21 1857  TSH 2.782   Anemia work up No results for input(s): VITAMINB12, FOLATE, FERRITIN, TIBC, IRON, RETICCTPCT in the last 72 hours. Urinalysis    Component Value Date/Time   COLORURINE YELLOW 09/16/2021 1835   APPEARANCEUR CLEAR 09/16/2021 1835   APPEARANCEUR Clear 06/12/2021 0856   LABSPEC 1.015 09/16/2021 1835   LABSPEC 1.010 01/29/2012 1600   PHURINE 7.0 09/16/2021 1835   GLUCOSEU NEGATIVE 09/16/2021 1835   GLUCOSEU 50 mg/dL 01/29/2012 1600   HGBUR NEGATIVE 09/16/2021 1835   BILIRUBINUR NEGATIVE 09/16/2021 1835   BILIRUBINUR Negative 06/12/2021 0856   BILIRUBINUR Negative 01/29/2012 1600   KETONESUR NEGATIVE 09/16/2021 1835   PROTEINUR 30 (A) 09/16/2021 1835   NITRITE NEGATIVE 09/16/2021 1835   LEUKOCYTESUR TRACE (A) 09/16/2021 1835   LEUKOCYTESUR Negative 01/29/2012 1600   Sepsis Labs Invalid input(s): PROCALCITONIN,  WBC,  LACTICIDVEN Microbiology Recent Results (from the past 240 hour(s))  Resp Panel by RT-PCR (Flu A&B, Covid) Nasopharyngeal Swab     Status: None   Collection Time: 09/14/21  6:56 PM   Specimen: Nasopharyngeal Swab; Nasopharyngeal(NP) swabs in vial transport medium  Result Value Ref Range Status   SARS Coronavirus 2 by RT PCR NEGATIVE NEGATIVE Final    Comment: (NOTE) SARS-CoV-2 target nucleic acids are NOT DETECTED.  The SARS-CoV-2 RNA is  generally detectable in upper respiratory specimens during the acute phase of infection. The lowest concentration of SARS-CoV-2 viral copies this assay can detect is 138 copies/mL. A negative result does not preclude SARS-Cov-2 infection and should not be used as the sole basis for treatment or other patient management decisions. A negative result may occur with  improper specimen collection/handling, submission of specimen other than nasopharyngeal swab, presence of viral mutation(s) within the areas targeted by this assay, and inadequate number of viral copies(<138 copies/mL). A negative result must be combined with clinical observations, patient history, and epidemiological information. The expected result is Negative.  Fact Sheet for Patients:  EntrepreneurPulse.com.au  Fact Sheet for Healthcare Providers:  IncredibleEmployment.be  This test is no t yet approved or cleared by the [place] FDA and  has been authorized for detection and/or diagnosis of SARS-CoV-2 by FDA under an Emergency Use Authorization (EUA). This EUA will remain  in effect (meaning this test can be used) for the duration of the COVID-19 declaration under Section 564(b)(1) of the Act, 21 U.S.C.section 360bbb-3(b)(1), unless the authorization is terminated  or revoked sooner.       Influenza A by PCR NEGATIVE NEGATIVE Final   Influenza B by PCR NEGATIVE NEGATIVE Final    Comment: (NOTE) The Xpert Xpress SARS-CoV-2/FLU/RSV plus assay is intended as an aid in the diagnosis of influenza from Nasopharyngeal swab specimens and should  not be used as a sole basis for treatment. Nasal washings and aspirates are unacceptable for Xpert Xpress SARS-CoV-2/FLU/RSV testing.  Fact Sheet for Patients: EntrepreneurPulse.com.au  Fact Sheet for Healthcare Providers: IncredibleEmployment.be  This test is not yet approved or cleared by the [place] FDA and has been authorized for detection and/or diagnosis of SARS-CoV-2 by FDA under an Emergency Use Authorization (EUA). This EUA will remain in effect (meaning this test can be used) for the duration of the COVID-19 declaration under Section 564(b)(1) of the Act, 21 U.S.C. section 360bbb-3(b)(1), unless the authorization is terminated or revoked.  Performed at [facility], [address], [place], [place] [address]      Time coordinating discharge: Over 30 minutes  SIGNED:   Sidney Ace, MD  [facility] 09/17/2021, 11:20 AM Pager   If 7PM-7AM, please contact night-coverage

## 2021-09-17 NOTE — Consult Note (Signed)
ANTICOAGULATION CONSULT NOTE  Pharmacy Consult for heparin Indication: chest pain/ACS  Allergies  Allergen Reactions   Atorvastatin     Other reaction(s): Muscle Pain   Bupropion Other (See Comments)    Other reaction(s): Dizziness CNS side effects   Citalopram     Other reaction(s): Other (See Comments) GI side effects   Pioglitazone     Other reaction(s): Muscle Pain    Patient Measurements: Height: 5\' 7"  (170.2 cm) Weight: 99.5 kg (219 lb 5.7 oz) IBW/kg (Calculated) : 66.1 Heparin Dosing Weight: 88 kg  Vital Signs: Temp: 98.2 F (36.8 C) (12/27 0313) BP: 122/59 (12/27 0313) Pulse Rate: 69 (12/27 0313)  Labs: Recent Labs    09/14/21 1434 09/14/21 1434 09/14/21 1629 09/14/21 1857 09/15/21 0316 09/15/21 0619 09/15/21 1531 09/16/21 0133 09/16/21 0532 09/16/21 0941 09/16/21 1051 09/17/21 0517  HGB 10.8*  --   --   --   --  10.6*  --   --  10.0*  --   --  9.4*  HCT 34.7*  --   --   --   --  33.7*  --   --  31.8*  --   --  29.9*  PLT 292  --   --   --   --  323  --   --  301  --   --  272  APTT  --    < > 36  --  60*  --    < > 87*  --  80*  --  79*  LABPROT  --   --   --   --   --  15.7*  --   --   --   --   --   --   INR  --   --   --   --   --  1.2  --   --   --   --   --   --   HEPARINUNFRC  --   --  >1.10*  --   --   --   --   --   --  1.03*  --  0.46  CREATININE 0.90  --   --   --   --  1.01  --   --   --  0.99  --   --   TROPONINIHS 611*  --  3,093*   < > >24,000*  --   --   --   --  8,186* 7,651*  --    < > = values in this interval not displayed.     Estimated Creatinine Clearance: 69.1 mL/min (by C-G formula based on SCr of 0.99 mg/dL).   Medical History: Past Medical History:  Diagnosis Date   Aortic stenosis    CAD (coronary artery disease)    Diabetes (HCC)    HTN (hypertension)    PAF (paroxysmal atrial fibrillation) (HCC)     Medications:  On Eliquis 5 mg BID, last dose 12/24 @ 0900   Assessment: 80 y.o. male with a history of  coronary artery disease, diabetes, hypertension, paroxysmal atrial fibrillation, aortic stenosis who presents with complaints of chest discomfort. Troponin elevated to 611. Pharmacy has been consulted for heparin dosing for ACS.   Baseline labs: Hgb 10.8, plts 292, aPTT 36 and heparin level >1.10   Date Time aPTT/HL Rate/comment  1224 0316 aPTT 60 1150 un/hr, subtherapeutic  1224 1531 aPTT 64 1300 un/hr, subtherapeutic  12/26 0133 aPTT 87 12/26 0941 80/1.03 Therapeutic.  12/27 0517 79/0.46 Therapeutic  Goal of Therapy:  Heparin level 0.3-0.7 units/ml aPTT 66-102 seconds Monitor platelets by anticoagulation protocol: Yes   Plan:  aPTT is therapeutic. Will continue heparin infusion at 1450 units/hr. Will recheck heparin level/aPTT/CBC with AM labs.  Switch to HL after aPTT and HL correlation is confirmed.   Otelia Sergeant, PharmD, [facility] 09/17/2021 6:45 AM

## 2021-09-17 NOTE — Progress Notes (Addendum)
[facility]    SUBJECTIVE: Patient has had questions about the procedure when she realized she is at PCI and stent will be very similar.  Similarly severe denies any chest pain or shortness of breath concerned about bleeding patient on heparin Eliquis has been held feels reasonably well   Vitals:   09/16/21 1942 09/16/21 2313 09/17/21 0313 09/17/21 0444  BP: 123/60 (!) 126/55 (!) 122/59   Pulse: 81 76 69   Resp: 18 15 16    Temp: 97.9 F (36.6 C) 98.5 F (36.9 C) 98.2 F (36.8 C)   TempSrc:      SpO2: 95% 93% 94%   Weight:    99.5 kg  Height:         Intake/Output Summary (Last 24 hours) at 09/17/2021 [id number] Last data filed at 09/16/2021 2236 Gross per 24 hour  Intake --  Output 1600 ml  Net -1600 ml      PHYSICAL EXAM  General: Well developed, well nourished, in no acute distress HEENT:  Normocephalic and atramatic Neck:  No JVD.  Lungs: Clear bilaterally to auscultation and percussion. Heart: HRRR . Normal S1 and S2 without gallops or murmurs.  Abdomen: Bowel sounds are positive, abdomen soft and non-tender  Msk:  Back normal, normal gait. Normal strength and tone for age. Extremities: No clubbing, cyanosis or edema.   Neuro: Alert and oriented X 3. Psych:  Good affect, responds appropriately   LABS: Basic Metabolic Panel: Recent Labs    09/15/21 0619 09/16/21 0941  NA 135 136  K 4.0 3.8  CL 102 104  CO2 23 24  GLUCOSE 132* 203*  BUN 17 14  CREATININE 1.01 0.99  CALCIUM 8.7* 8.4*  MG  --  1.9   Liver Function Tests: Recent Labs    09/14/21 1434  AST 35  ALT 17  ALKPHOS 68  BILITOT 0.7  PROT 7.4  ALBUMIN 3.7   No results for input(s): LIPASE, AMYLASE in the last 72 hours. CBC: Recent Labs    09/16/21 0532 09/17/21 0517  WBC 11.8* 10.6*  HGB 10.0* 9.4*  HCT 31.8* 29.9*  MCV 73.6* 74.8*  PLT 301 272   Cardiac Enzymes: No results for input(s): CKTOTAL, CKMB, CKMBINDEX, TROPONINI in the last 72 hours. BNP: Invalid input(s):  POCBNP D-Dimer: No results for input(s): DDIMER in the last 72 hours. Hemoglobin A1C: Recent Labs    09/15/21 1531  HGBA1C 6.5*   Fasting Lipid Panel: Recent Labs    09/15/21 0619  CHOL 113  HDL 28*  LDLCALC 43  TRIG 210*  CHOLHDL 4.0   Thyroid Function Tests: Recent Labs    09/14/21 1857  TSH 2.782   Anemia Panel: No results for input(s): VITAMINB12, FOLATE, FERRITIN, TIBC, IRON, RETICCTPCT in the last 72 hours.  ECHOCARDIOGRAM COMPLETE  Result Date: 09/16/2021    ECHOCARDIOGRAM REPORT   Patient Name:   Harry [name] Date of Exam: 09/15/2021 Medical Rec #:  [id number]         Height:       67.0 in Accession #:    [id number]        Weight:       222.0 lb Date of Birth:  Mar 02, 1941          BSA:          2.114 m Patient Age:    80 years          BP:           92/63 mmHg  Patient Gender: M                 HR:           69 bpm. Exam Location:  [facility] Procedure: 2D Echo and Intracardiac Opacification Agent Indications:     Chest Pain R07.9  History:         Patient has prior history of Echocardiogram examinations, most                  recent 04/02/2021.  Sonographer:     Overton Mam RDCS Referring Phys:  [id number] SARA-MAIZ A THOMAS Diagnosing Phys: Alwyn Pea MD  Sonographer Comments: Suboptimal apical window and suboptimal subcostal window. Image acquisition challenging due to patient body habitus. IMPRESSIONS  1. Ant/septal/apical hypo.  2. Left ventricular ejection fraction, by estimation, is 40 to 45%. The left ventricle has mildly decreased function. The left ventricle demonstrates regional wall motion abnormalities (see scoring diagram/findings for description). The left ventricular  internal cavity size was moderately to severely dilated. Left ventricular diastolic parameters are consistent with Grade II diastolic dysfunction (pseudonormalization).  3. Right ventricular systolic function is low normal. The right ventricular size is mildly enlarged.  4. The mitral valve is  grossly normal. Trivial mitral valve regurgitation.  5. The aortic valve is normal in structure. Aortic valve regurgitation is not visualized. FINDINGS  Left Ventricle: Left ventricular ejection fraction, by estimation, is 40 to 45%. The left ventricle has mildly decreased function. The left ventricle demonstrates regional wall motion abnormalities. Definity contrast agent was given IV to delineate the left ventricular endocardial borders. The left ventricular internal cavity size was moderately to severely dilated. There is no left ventricular hypertrophy. Left ventricular diastolic parameters are consistent with Grade II diastolic dysfunction (pseudonormalization). Right Ventricle: The right ventricular size is mildly enlarged. No increase in right ventricular wall thickness. Right ventricular systolic function is low normal. Left Atrium: Left atrial size was normal in size. Right Atrium: Right atrial size was normal in size. Pericardium: There is no evidence of pericardial effusion. Mitral Valve: The mitral valve is grossly normal. Trivial mitral valve regurgitation. Tricuspid Valve: The tricuspid valve is normal in structure. Tricuspid valve regurgitation is trivial. Aortic Valve: The aortic valve is normal in structure. Aortic valve regurgitation is not visualized. Aortic valve mean gradient measures 20.7 mmHg. Aortic valve peak gradient measures 35.8 mmHg. Aortic valve area, by VTI measures 0.67 cm. Pulmonic Valve: The pulmonic valve was normal in structure. Pulmonic valve regurgitation is not visualized. Aorta: The ascending aorta was not well visualized. IAS/Shunts: No atrial level shunt detected by color flow Doppler. Additional Comments: Ant/septal/apical hypo.  LEFT VENTRICLE PLAX 2D LVIDd:         6.05 cm      Diastology LVIDs:         4.75 cm      LV e' medial:    5.11 cm/s LV PW:         1.10 cm      LV E/e' medial:  23.5 LV IVS:        1.35 cm      LV e' lateral:   8.70 cm/s LVOT diam:     2.20 cm       LV E/e' lateral: 13.8 LV SV:         37 LV SV Index:   17 LVOT Area:     3.80 cm  LV Volumes (MOD) LV vol d, MOD A2C: 136.0 ml LV vol  d, MOD A4C: 131.0 ml LV vol s, MOD A2C: 74.9 ml LV vol s, MOD A4C: 71.2 ml LV SV MOD A2C:     61.1 ml LV SV MOD A4C:     131.0 ml LV SV MOD BP:      62.5 ml LEFT ATRIUM           Index LA diam:      3.90 cm 1.84 cm/m LA Vol (A2C): 43.0 ml 20.34 ml/m LA Vol (A4C): 60.8 ml 28.76 ml/m  AORTIC VALVE                     PULMONIC VALVE AV Area (Vmax):    0.70 cm      PV Vmax:       1.70 m/s AV Area (Vmean):   0.62 cm      PV Peak grad:  11.6 mmHg AV Area (VTI):     0.67 cm AV Vmax:           299.00 cm/s AV Vmean:          209.333 cm/s AV VTI:            0.550 m AV Peak Grad:      35.8 mmHg AV Mean Grad:      20.7 mmHg LVOT Vmax:         54.80 cm/s LVOT Vmean:        34.200 cm/s LVOT VTI:          0.096 m LVOT/AV VTI ratio: 0.18  AORTA Ao Root diam: 3.50 cm MITRAL VALVE                TRICUSPID VALVE MV Area (PHT): 5.97 cm     TV Peak grad:   14.3 mmHg MV Decel Time: 127 msec     TV Vmax:        1.89 m/s MV E velocity: 120.00 cm/s MV A velocity: 81.80 cm/s   SHUNTS MV E/A ratio:  1.47         Systemic VTI:  0.10 m                             Systemic Diam: 2.20 cm Kyndle Schlender D Kenniel Bergsma MD Electronically signed by Yolonda Kida MD Signature Date/Time: 09/16/2021/12:00:46 AM    Final      Echo mild reduced left ventricular function 40 to 45%  TELEMETRY: Normal sinus rhythm 65:  ASSESSMENT AND PLAN:  Principal Problem:   NSTEMI (non-ST elevated myocardial infarction) [facility]) Active Problems:   Atrial fibrillation with RVR (HCC) Coronary artery disease 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

## 2021-09-17 NOTE — Consult Note (Signed)
ANTICOAGULATION CONSULT NOTE  Pharmacy Consult for heparin Indication: chest pain/ACS  Allergies  Allergen Reactions   Atorvastatin     Other reaction(s): Muscle Pain   Bupropion Other (See Comments)    Other reaction(s): Dizziness CNS side effects   Citalopram     Other reaction(s): Other (See Comments) GI side effects   Pioglitazone     Other reaction(s): Muscle Pain    Patient Measurements: Height: 5\' 7"  (170.2 cm) Weight: 99.5 kg (219 lb 5.7 oz) IBW/kg (Calculated) : 66.1 Heparin Dosing Weight: 88 kg  Vital Signs: Temp: 99.1 F (37.3 C) (12/27 1613) Temp Source: Oral (12/27 1613) BP: 136/58 (12/27 1613) Pulse Rate: 72 (12/27 1613)  Labs: Recent Labs     0000 09/15/21 0316 09/15/21 0619 09/15/21 1531 09/16/21 0532 09/16/21 0941 09/16/21 1051 09/17/21 0517 09/17/21 1756  HGB   < >  --  10.6*  --  10.0*  --   --  9.4*  --   HCT  --   --  33.7*  --  31.8*  --   --  29.9*  --   PLT  --   --  323  --  301  --   --  272  --   APTT  --  60*  --    < >  --  80*  --  79* 58*  LABPROT  --   --  15.7*  --   --   --   --   --   --   INR  --   --  1.2  --   --   --   --   --   --   HEPARINUNFRC  --   --   --   --   --  1.03*  --  0.46 0.32  CREATININE  --   --  1.01  --   --  0.99  --   --   --   TROPONINIHS  --  >24,000*  --   --   --  8,186* 7,651*  --   --    < > = values in this interval not displayed.     Estimated Creatinine Clearance: 69.1 mL/min (by C-G formula based on SCr of 0.99 mg/dL).   Medical History: Past Medical History:  Diagnosis Date   Aortic stenosis    CAD (coronary artery disease)    Diabetes (HCC)    HTN (hypertension)    PAF (paroxysmal atrial fibrillation) (HCC)     Medications:  On Eliquis 5 mg BID, last dose 12/24 @ 0900  Heparin Dosing Weight: 88 kg  Assessment: 80 y.o. male with a history of coronary artery disease, diabetes, hypertension, paroxysmal atrial fibrillation, aortic stenosis who presents with complaints of  chest discomfort. Troponin elevated to 611. Pharmacy has been consulted for heparin dosing for ACS.   Baseline labs: Hgb 10.8, plts 292, aPTT 36 and heparin level >1.10   Date Time aPTT/HL Rate/comment  1224 0316 aPTT 60 1150 un/hr, subtherapeutic  1224 1531 aPTT 64 1300 un/hr, subtherapeutic  12/26 0133 aPTT 87 12/26 0941 80s/1.03 Therapeutic.  12/27 0517 79s/0.46 Therapeutic 12/27 1756 58s/0.32 Subthera/thera; ~correlation. Will inc 1450 >1600 un/hr  Goal of Therapy:  Heparin level 0.3-0.7 units/ml aPTT 66-102 seconds Monitor platelets by anticoagulation protocol: Yes   Plan:  Pt planning transfer to OSH for CABG today.  aPTT is slightly subtherapeutic, but anti-xa appears to be approximately correlating and is borderline therapeutic. Will bolus & increase  infusion slightly in response to low aPTT, but titrate by anti-xa hereafter.  Bolus heparin 1300 units x1; then increase heparin infusion to 1600 units/hr.  Will recheck heparin level in ~8hrs from rate change & CBC daily with AM labs.   Martyn Malay, PharmD, [facility] Clinical Pharmacist 09/17/2021 6:45 PM

## 2021-09-17 NOTE — Care Management Important Message (Signed)
Important Message  Patient Details  Name: Harry [name] MRN: [id number] Date of Birth: 12-Mar-1941   Medicare Important Message Given:  Yes     [name] 09/17/2021, 1:15 PM

## 2021-09-17 NOTE — Progress Notes (Signed)
PT Cancellation Note  Patient Details Name: Harry [name] MRN: [id number] DOB: 09/17/1941   Cancelled Treatment:    Reason Eval/Treat Not Completed: Patient at procedure or test/unavailable.  Chart reviewed.  Pt currently off floor at procedure.  Will re-attempt PT evaluation at a later date/time as medically appropriate.  Hendricks Limes, PT 09/17/21, 9:43 AM

## 2021-11-16 ENCOUNTER — Emergency Department: Payer: Medicare HMO

## 2021-11-16 ENCOUNTER — Other Ambulatory Visit: Payer: Self-pay

## 2021-11-16 ENCOUNTER — Inpatient Hospital Stay (HOSPITAL_COMMUNITY): Payer: Medicare HMO

## 2021-11-16 ENCOUNTER — Inpatient Hospital Stay (HOSPITAL_COMMUNITY)
Admission: AD | Admit: 2021-11-16 | Discharge: 2021-12-21 | DRG: 100 | Disposition: E | Payer: Medicare HMO | Source: Other Acute Inpatient Hospital | Attending: Pulmonary Disease | Admitting: Pulmonary Disease

## 2021-11-16 ENCOUNTER — Emergency Department
Admission: EM | Admit: 2021-11-16 | Discharge: 2021-11-16 | Disposition: A | Discharge status: Other Acute Inpatient Hospital | Payer: Medicare HMO | Attending: Student in an Organized Health Care Education/Training Program | Admitting: Student in an Organized Health Care Education/Training Program

## 2021-11-16 DIAGNOSIS — G9340 Encephalopathy, unspecified: Secondary | ICD-10-CM

## 2021-11-16 DIAGNOSIS — N179 Acute kidney failure, unspecified: Secondary | ICD-10-CM | POA: Diagnosis not present

## 2021-11-16 DIAGNOSIS — Z955 Presence of coronary angioplasty implant and graft: Secondary | ICD-10-CM | POA: Diagnosis not present

## 2021-11-16 DIAGNOSIS — R569 Unspecified convulsions: Secondary | ICD-10-CM

## 2021-11-16 DIAGNOSIS — H35033 Hypertensive retinopathy, bilateral: Secondary | ICD-10-CM | POA: Diagnosis present

## 2021-11-16 DIAGNOSIS — D649 Anemia, unspecified: Secondary | ICD-10-CM | POA: Diagnosis not present

## 2021-11-16 DIAGNOSIS — I5022 Chronic systolic (congestive) heart failure: Secondary | ICD-10-CM | POA: Diagnosis present

## 2021-11-16 DIAGNOSIS — J988 Other specified respiratory disorders: Secondary | ICD-10-CM

## 2021-11-16 DIAGNOSIS — I2510 Atherosclerotic heart disease of native coronary artery without angina pectoris: Secondary | ICD-10-CM | POA: Diagnosis present

## 2021-11-16 DIAGNOSIS — G4733 Obstructive sleep apnea (adult) (pediatric): Secondary | ICD-10-CM | POA: Diagnosis present

## 2021-11-16 DIAGNOSIS — J9601 Acute respiratory failure with hypoxia: Secondary | ICD-10-CM

## 2021-11-16 DIAGNOSIS — T41295A Adverse effect of other general anesthetics, initial encounter: Secondary | ICD-10-CM | POA: Diagnosis not present

## 2021-11-16 DIAGNOSIS — G928 Other toxic encephalopathy: Secondary | ICD-10-CM | POA: Diagnosis not present

## 2021-11-16 DIAGNOSIS — I7409 Other arterial embolism and thrombosis of abdominal aorta: Secondary | ICD-10-CM | POA: Diagnosis not present

## 2021-11-16 DIAGNOSIS — E872 Acidosis, unspecified: Secondary | ICD-10-CM

## 2021-11-16 DIAGNOSIS — I350 Nonrheumatic aortic (valve) stenosis: Secondary | ICD-10-CM | POA: Diagnosis present

## 2021-11-16 DIAGNOSIS — J449 Chronic obstructive pulmonary disease, unspecified: Secondary | ICD-10-CM | POA: Diagnosis present

## 2021-11-16 DIAGNOSIS — Z7901 Long term (current) use of anticoagulants: Secondary | ICD-10-CM

## 2021-11-16 DIAGNOSIS — Z515 Encounter for palliative care: Secondary | ICD-10-CM | POA: Diagnosis not present

## 2021-11-16 DIAGNOSIS — Z20822 Contact with and (suspected) exposure to covid-19: Secondary | ICD-10-CM | POA: Insufficient documentation

## 2021-11-16 DIAGNOSIS — N400 Enlarged prostate without lower urinary tract symptoms: Secondary | ICD-10-CM | POA: Diagnosis present

## 2021-11-16 DIAGNOSIS — Z6829 Body mass index (BMI) 29.0-29.9, adult: Secondary | ICD-10-CM | POA: Diagnosis not present

## 2021-11-16 DIAGNOSIS — D696 Thrombocytopenia, unspecified: Secondary | ICD-10-CM | POA: Diagnosis not present

## 2021-11-16 DIAGNOSIS — E440 Moderate protein-calorie malnutrition: Secondary | ICD-10-CM | POA: Diagnosis present

## 2021-11-16 DIAGNOSIS — Z66 Do not resuscitate: Secondary | ICD-10-CM | POA: Diagnosis not present

## 2021-11-16 DIAGNOSIS — E1165 Type 2 diabetes mellitus with hyperglycemia: Secondary | ICD-10-CM | POA: Diagnosis present

## 2021-11-16 DIAGNOSIS — R6521 Severe sepsis with septic shock: Secondary | ICD-10-CM

## 2021-11-16 DIAGNOSIS — E785 Hyperlipidemia, unspecified: Secondary | ICD-10-CM | POA: Diagnosis present

## 2021-11-16 DIAGNOSIS — E669 Obesity, unspecified: Secondary | ICD-10-CM | POA: Diagnosis present

## 2021-11-16 DIAGNOSIS — I480 Paroxysmal atrial fibrillation: Secondary | ICD-10-CM | POA: Diagnosis present

## 2021-11-16 DIAGNOSIS — Z9889 Other specified postprocedural states: Secondary | ICD-10-CM

## 2021-11-16 DIAGNOSIS — G40901 Epilepsy, unspecified, not intractable, with status epilepticus: Principal | ICD-10-CM

## 2021-11-16 DIAGNOSIS — R0689 Other abnormalities of breathing: Secondary | ICD-10-CM | POA: Insufficient documentation

## 2021-11-16 DIAGNOSIS — R402431 Glasgow coma scale score 3-8, in the field [EMT or ambulance]: Secondary | ICD-10-CM

## 2021-11-16 DIAGNOSIS — E11319 Type 2 diabetes mellitus with unspecified diabetic retinopathy without macular edema: Secondary | ICD-10-CM | POA: Diagnosis present

## 2021-11-16 DIAGNOSIS — T882XXA Shock due to anesthesia, initial encounter: Secondary | ICD-10-CM | POA: Diagnosis not present

## 2021-11-16 DIAGNOSIS — R4182 Altered mental status, unspecified: Secondary | ICD-10-CM | POA: Diagnosis present

## 2021-11-16 DIAGNOSIS — R4189 Other symptoms and signs involving cognitive functions and awareness: Secondary | ICD-10-CM

## 2021-11-16 DIAGNOSIS — Z951 Presence of aortocoronary bypass graft: Secondary | ICD-10-CM

## 2021-11-16 DIAGNOSIS — R4020 Unspecified coma: Secondary | ICD-10-CM | POA: Diagnosis present

## 2021-11-16 DIAGNOSIS — D72829 Elevated white blood cell count, unspecified: Secondary | ICD-10-CM

## 2021-11-16 DIAGNOSIS — I110 Hypertensive heart disease with heart failure: Secondary | ICD-10-CM | POA: Diagnosis present

## 2021-11-16 DIAGNOSIS — A419 Sepsis, unspecified organism: Secondary | ICD-10-CM | POA: Diagnosis not present

## 2021-11-16 DIAGNOSIS — Z888 Allergy status to other drugs, medicaments and biological substances status: Secondary | ICD-10-CM

## 2021-11-16 DIAGNOSIS — Z8673 Personal history of transient ischemic attack (TIA), and cerebral infarction without residual deficits: Secondary | ICD-10-CM

## 2021-11-16 DIAGNOSIS — N401 Enlarged prostate with lower urinary tract symptoms: Secondary | ICD-10-CM | POA: Diagnosis not present

## 2021-11-16 DIAGNOSIS — Z8679 Personal history of other diseases of the circulatory system: Secondary | ICD-10-CM

## 2021-11-16 DIAGNOSIS — J9690 Respiratory failure, unspecified, unspecified whether with hypoxia or hypercapnia: Secondary | ICD-10-CM

## 2021-11-16 DIAGNOSIS — E119 Type 2 diabetes mellitus without complications: Secondary | ICD-10-CM

## 2021-11-16 DIAGNOSIS — Z794 Long term (current) use of insulin: Secondary | ICD-10-CM

## 2021-11-16 DIAGNOSIS — T50905A Adverse effect of unspecified drugs, medicaments and biological substances, initial encounter: Secondary | ICD-10-CM | POA: Diagnosis not present

## 2021-11-16 LAB — RESP PANEL BY RT-PCR (FLU A&B, COVID) ARPGX2
Influenza A by PCR: NEGATIVE
Influenza B by PCR: NEGATIVE
SARS Coronavirus 2 by RT PCR: NEGATIVE

## 2021-11-16 LAB — LACTIC ACID, PLASMA
Lactic Acid, Venous: 7.2 mmol/L (ref 0.5–1.9)
Lactic Acid, Venous: 3.7 mmol/L (ref 0.5–1.9)

## 2021-11-16 LAB — CBC WITH DIFFERENTIAL/PLATELET
Abs Immature Granulocytes: 0.08 10*3/uL — ABNORMAL HIGH (ref 0.00–0.07)
Basophils Absolute: 0.2 10*3/uL — ABNORMAL HIGH (ref 0.0–0.1)
Basophils Relative: 1 %
Eosinophils Absolute: 0.3 10*3/uL (ref 0.0–0.5)
Eosinophils Relative: 2 %
HCT: 42.6 % (ref 39.0–52.0)
Hemoglobin: 12.3 g/dL — ABNORMAL LOW (ref 13.0–17.0)
Immature Granulocytes: 1 %
Lymphocytes Relative: 40 %
Lymphs Abs: 5.5 10*3/uL — ABNORMAL HIGH (ref 0.7–4.0)
MCH: 24.5 pg — ABNORMAL LOW (ref 26.0–34.0)
MCHC: 28.9 g/dL — ABNORMAL LOW (ref 30.0–36.0)
MCV: 84.9 fL (ref 80.0–100.0)
Monocytes Absolute: 0.8 10*3/uL (ref 0.1–1.0)
Monocytes Relative: 6 %
Neutro Abs: 7.1 10*3/uL (ref 1.7–7.7)
Neutrophils Relative %: 50 %
Platelets: 175 10*3/uL (ref 150–400)
RBC: 5.02 MIL/uL (ref 4.22–5.81)
RDW: 21.6 % — ABNORMAL HIGH (ref 11.5–15.5)
Smear Review: NORMAL
WBC: 14.4 10*3/uL — ABNORMAL HIGH (ref 4.0–10.5)
nRBC: 0 % (ref 0.0–0.2)

## 2021-11-16 LAB — POCT I-STAT 7, (LYTES, BLD GAS, ICA,H+H)
Acid-base deficit: 7 mmol/L — ABNORMAL HIGH (ref 0.0–2.0)
Bicarbonate: 18.3 mmol/L — ABNORMAL LOW (ref 20.0–28.0)
Calcium, Ion: 1.22 mmol/L (ref 1.15–1.40)
HCT: 30 % — ABNORMAL LOW (ref 39.0–52.0)
Hemoglobin: 10.2 g/dL — ABNORMAL LOW (ref 13.0–17.0)
O2 Saturation: 100 %
Potassium: 4.1 mmol/L (ref 3.5–5.1)
Sodium: 137 mmol/L (ref 135–145)
TCO2: 19 mmol/L — ABNORMAL LOW (ref 22–32)
pCO2 arterial: 33.8 mmHg (ref 32–48)
pH, Arterial: 7.342 — ABNORMAL LOW (ref 7.35–7.45)
pO2, Arterial: 207 mmHg — ABNORMAL HIGH (ref 83–108)

## 2021-11-16 LAB — COMPREHENSIVE METABOLIC PANEL
ALT: 19 U/L (ref 0–44)
AST: 35 U/L (ref 15–41)
Albumin: 3.4 g/dL — ABNORMAL LOW (ref 3.5–5.0)
Alkaline Phosphatase: 94 U/L (ref 38–126)
Anion gap: 19 — ABNORMAL HIGH (ref 5–15)
BUN: 33 mg/dL — ABNORMAL HIGH (ref 8–23)
CO2: 13 mmol/L — ABNORMAL LOW (ref 22–32)
Calcium: 8.9 mg/dL (ref 8.9–10.3)
Chloride: 104 mmol/L (ref 98–111)
Creatinine, Ser: 1.6 mg/dL — ABNORMAL HIGH (ref 0.61–1.24)
GFR, Estimated: 44 mL/min — ABNORMAL LOW (ref >60)
Glucose, Bld: 175 mg/dL — ABNORMAL HIGH (ref 70–99)
Potassium: 4.4 mmol/L (ref 3.5–5.1)
Sodium: 136 mmol/L (ref 135–145)
Total Bilirubin: 0.8 mg/dL (ref 0.3–1.2)
Total Protein: 7.6 g/dL (ref 6.5–8.1)

## 2021-11-16 LAB — GLUCOSE, CAPILLARY
Glucose-Capillary: 142 mg/dL — ABNORMAL HIGH (ref 70–99)
Glucose-Capillary: 146 mg/dL — ABNORMAL HIGH (ref 70–99)
Glucose-Capillary: 133 mg/dL — ABNORMAL HIGH (ref 70–99)

## 2021-11-16 LAB — URINALYSIS, COMPLETE (UACMP) WITH MICROSCOPIC
Bilirubin Urine: NEGATIVE
Glucose, UA: NEGATIVE mg/dL
Hgb urine dipstick: NEGATIVE
Ketones, ur: NEGATIVE mg/dL
Leukocytes,Ua: NEGATIVE
Nitrite: NEGATIVE
Protein, ur: 30 mg/dL — AB
Specific Gravity, Urine: 1.02 (ref 1.005–1.030)
pH: 5 (ref 5.0–8.0)

## 2021-11-16 LAB — HEMOGLOBIN A1C
Hgb A1c MFr Bld: 5.8 % — ABNORMAL HIGH (ref 4.8–5.6)
Mean Plasma Glucose: 119.76 mg/dL

## 2021-11-16 LAB — CBG MONITORING, ED: Glucose-Capillary: 165 mg/dL — ABNORMAL HIGH (ref 70–99)

## 2021-11-16 LAB — AMMONIA: Ammonia: 10 umol/L (ref 9–35)

## 2021-11-16 LAB — TROPONIN I (HIGH SENSITIVITY): Troponin I (High Sensitivity): 30 ng/L — ABNORMAL HIGH (ref <18)

## 2021-11-16 LAB — MRSA NEXT GEN BY PCR, NASAL: MRSA by PCR Next Gen: NOT DETECTED

## 2021-11-16 LAB — TSH: TSH: 1.11 u[IU]/mL (ref 0.350–4.500)

## 2021-11-16 LAB — APTT: aPTT: 41 seconds — ABNORMAL HIGH (ref 24–36)

## 2021-11-16 LAB — PROTIME-INR
INR: 1.7 — ABNORMAL HIGH (ref 0.8–1.2)
Prothrombin Time: 20.3 seconds — ABNORMAL HIGH (ref 11.4–15.2)

## 2021-11-16 IMAGING — CT CT ANGIO HEAD-NECK (W OR W/O PERF)
2 of 7 series · 8 of 33 positions shown · IV contrast (APPLIED)
Comparison: [DATE]

CLINICAL DATA: Stroke, follow up; unresponsive

EXAM:
CT ANGIOGRAPHY HEAD AND NECK
TECHNIQUE: Multidetector CT imaging of the head and neck was performed using
the standard protocol during bolus administration of intravenous
contrast. Multiplanar CT image reconstructions and MIPs were
obtained to evaluate the vascular anatomy. Carotid stenosis
measurements (when applicable) are obtained utilizing NASCET
criteria, using the distal internal carotid diameter as the
denominator.

[Series 4: cta head neck · axial · 0.57mm/px · z∈[-270,-152]mm · 2 of 178 slices shown]
[im 60/178  soft-tissue]
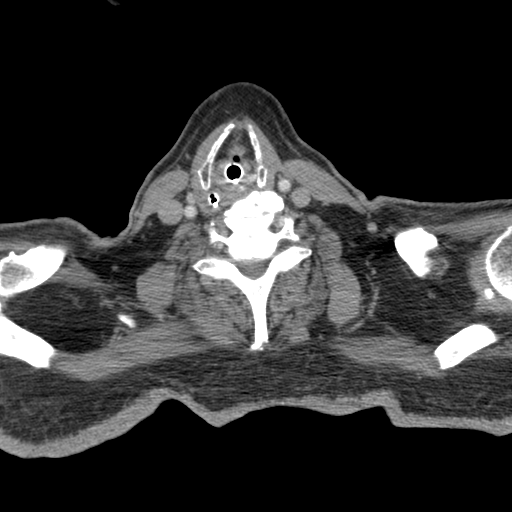
[im 119/178  soft-tissue]
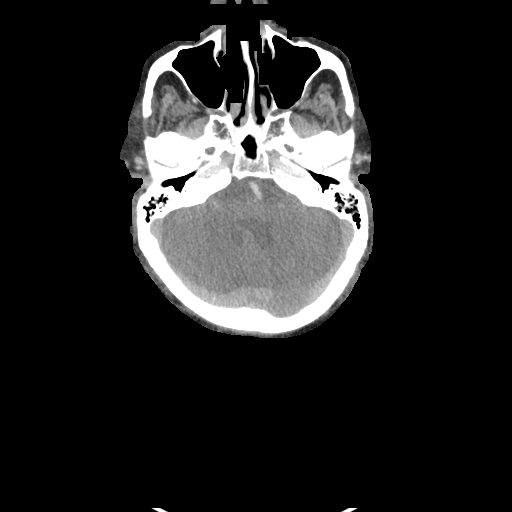

[Series 6: ax thin · axial · 0.64mm/px · z∈[-338,-85]mm · 6 of 355 slices shown]
[im 51/355  soft-tissue]
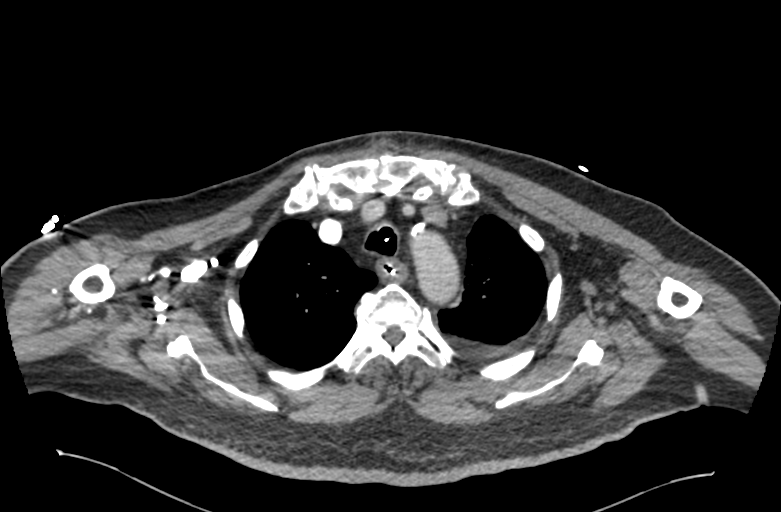
[im 102/355  bone]
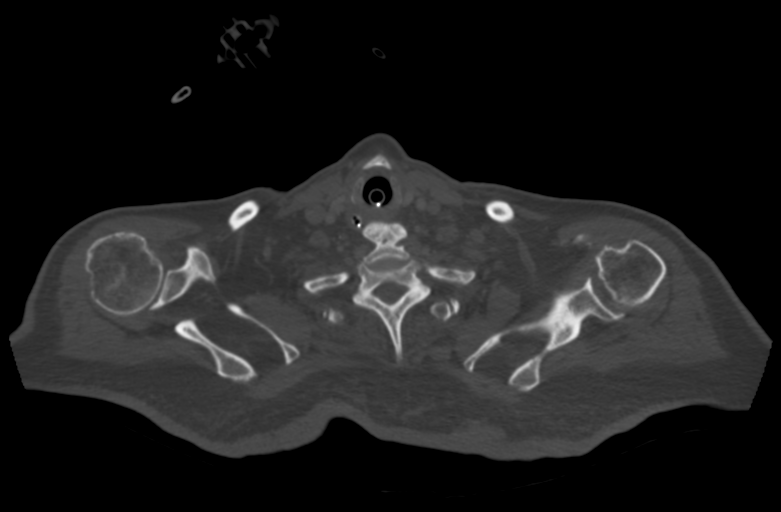
[im 152/355  soft-tissue]
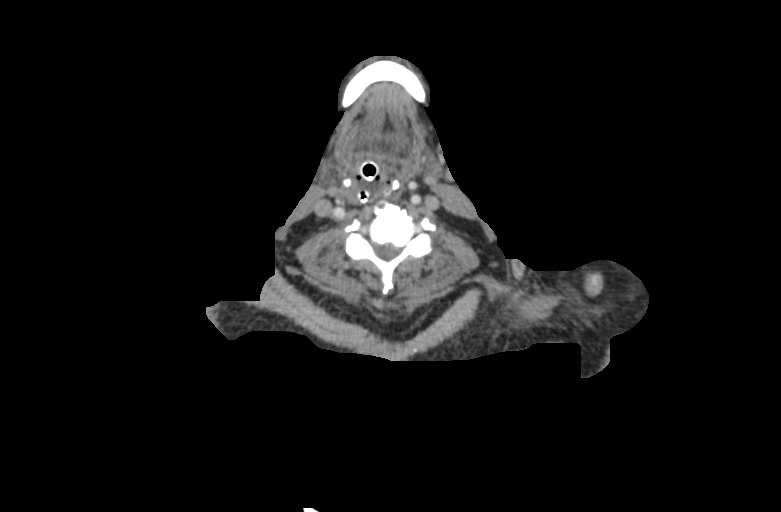
[im 203/355  bone]
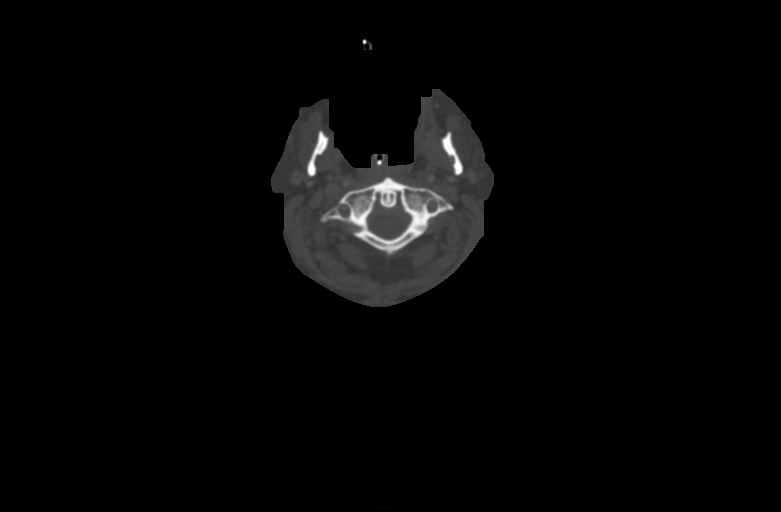
[im 253/355  soft-tissue]
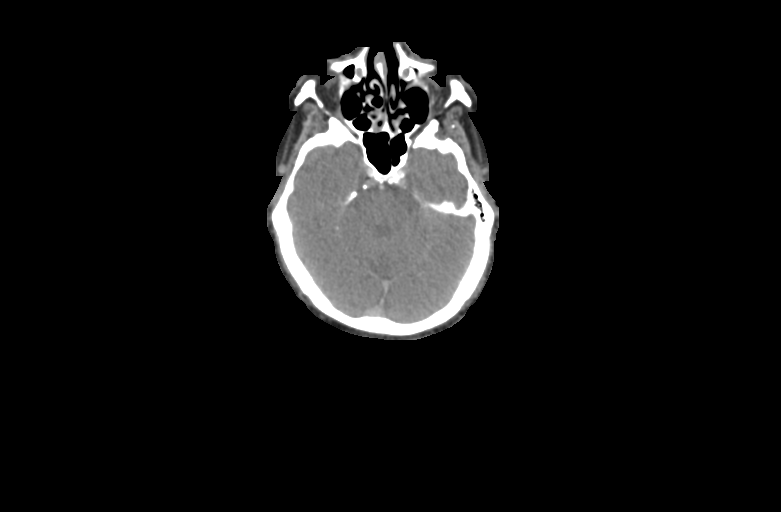
[im 304/355  bone]
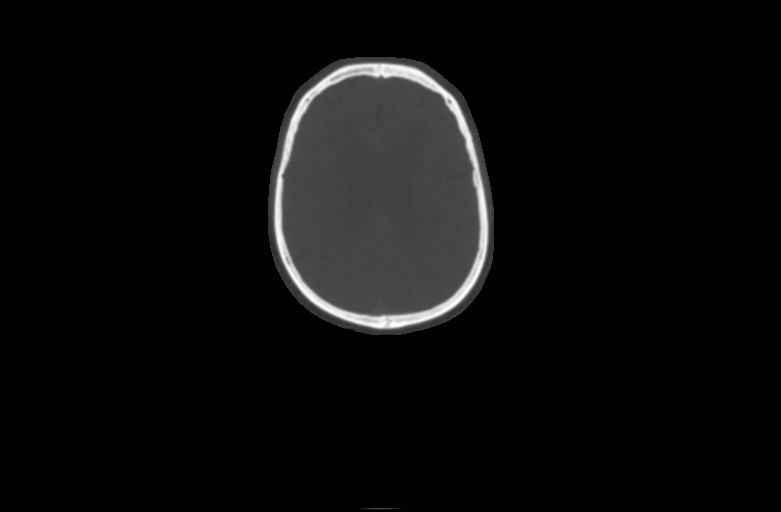

[8 of 33 positions shown; findings below may reference images not displayed]

RADIATION DOSE REDUCTION: This exam was performed according to the
departmental dose-optimization program which includes automated
exposure control, adjustment of the mA and/or kV according to
patient size and/or use of iterative reconstruction technique.

CONTRAST:  100mL OMNIPAQUE IOHEXOL 350 MG/ML SOLN
FINDINGS: CTA NECK

Suboptimal contrast bolus timing.

Aortic arch: Plaque along the arch. Great vessel origins are patent.
Direct origin of the left vertebral in the arch.

Right carotid system: Patent. Calcified plaque at the bifurcation
and proximal internal carotid with less than 50% stenosis. Marked
stenosis at the ECA origin.

Left carotid system: Patent. Calcified plaque at the bifurcation and
proximal internal carotid. No ICA origin stenosis. Marked stenosis
at the ECA origin.

Vertebral arteries: Patent.  Codominant.  No stenosis.

Skeleton: Cervical spine degenerative changes. Bulky anterior
osteophytes displacing the posterior pharyngeal wall and esophagus.

Other neck: Unremarkable.

Upper chest: Dictated separately.

Review of the MIP images confirms the above findings

CTA HEAD

Anterior circulation: Patent intracranial internal carotid arteries.
Calcified plaque along the cavernous and proximal supraclinoid
portions. Stenosis again ranges from mild to moderate and is greater
on the right. Anterior and middle cerebral arteries are patent.
Relatively diminished caliber of proximal right M2 branches probably
reflects atherosclerotic changes and was also present previously and
likely exacerbated by contrast bolus timing.

Posterior circulation: Intracranial vertebral arteries patent.
Basilar artery is patent. Posterior cerebral arteries are patent.
Distal right P2 PCA stenosis better seen on prior study.

Venous sinuses: Patent as allowed by contrast bolus timing.

Review of the MIP images confirms the above findings
IMPRESSION: No significant change since prior study. No large vessel occlusion,
hemodynamically significant stenosis, or evidence of dissection.

## 2021-11-16 IMAGING — CT CT ANGIO CHEST-ABD-PELV FOR DISSECTION W/ AND WO/W CM
2 of 7 series · 13 of 46 positions shown, 15 images · IV contrast (APPLIED)
Comparison: Current and prior chest radiographs.

CLINICAL DATA: Unresponsive patient. Clinical suspicion for acute
aortic syndrome.

EXAM:
CT ANGIOGRAPHY CHEST, ABDOMEN AND PELVIS
TECHNIQUE: Non-contrast CT of the chest was initially obtained.

[Series 5: axial arterial · axial · arterial · 0.89mm/px · z∈[-922,-334]mm · 10 of 222 slices shown, 12 images]
[im 13/222  soft-tissue]
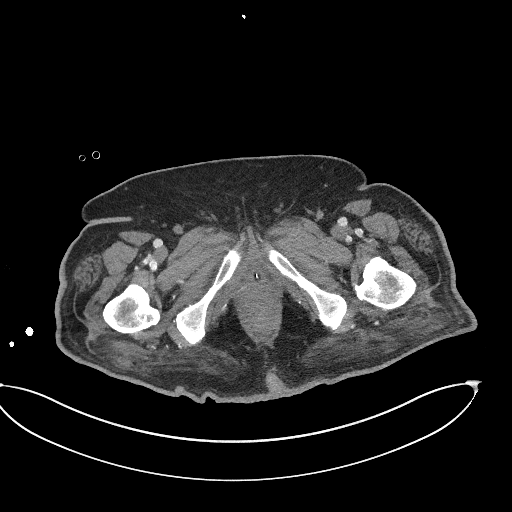
[im 13/222  bone]
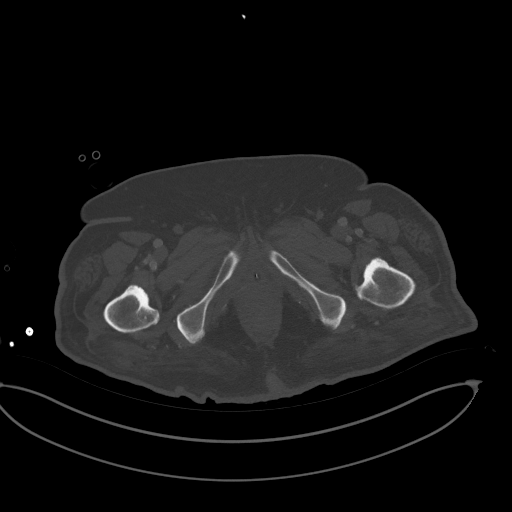
[im 37/222  soft-tissue]
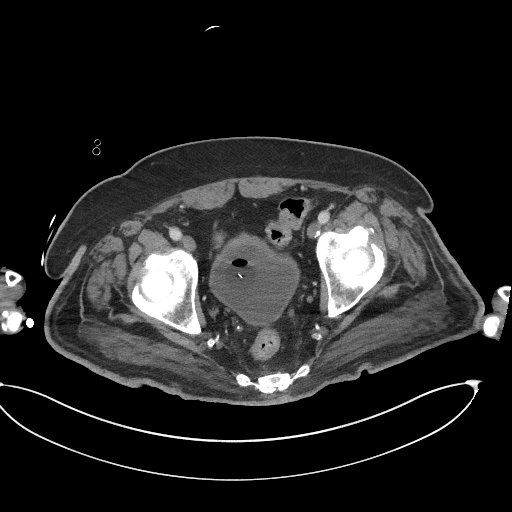
[im 62/222  soft-tissue]
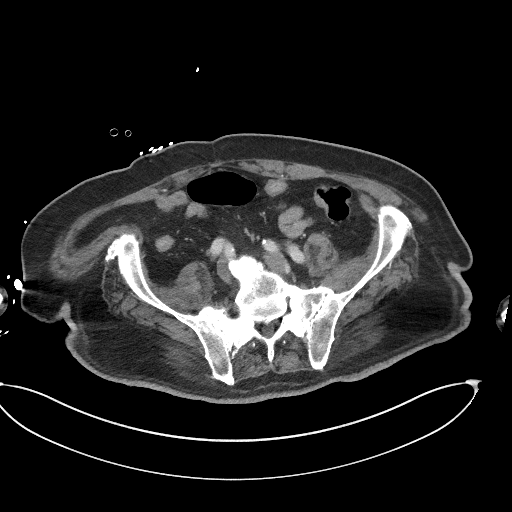
[im 74/222  soft-tissue]
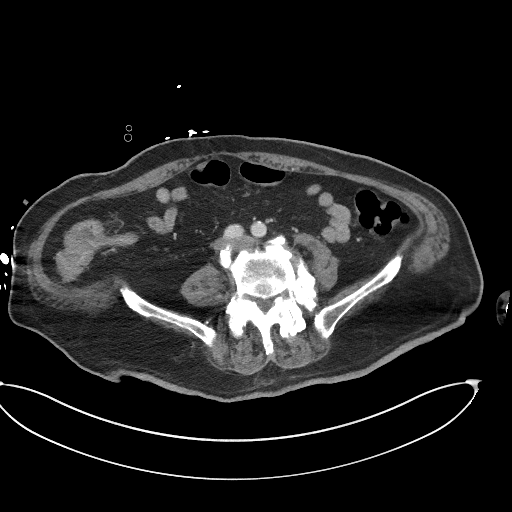
[im 99/222  soft-tissue]
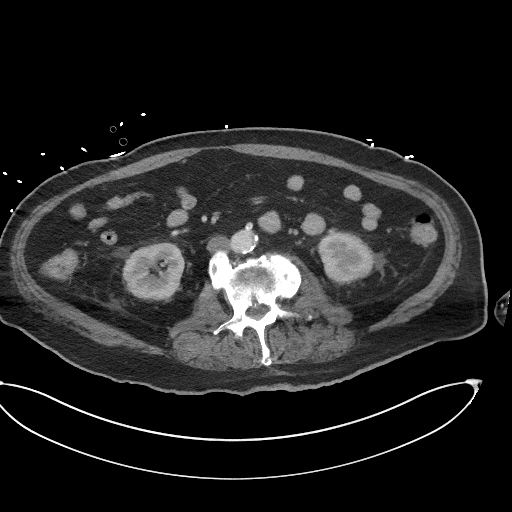
[im 123/222  soft-tissue]
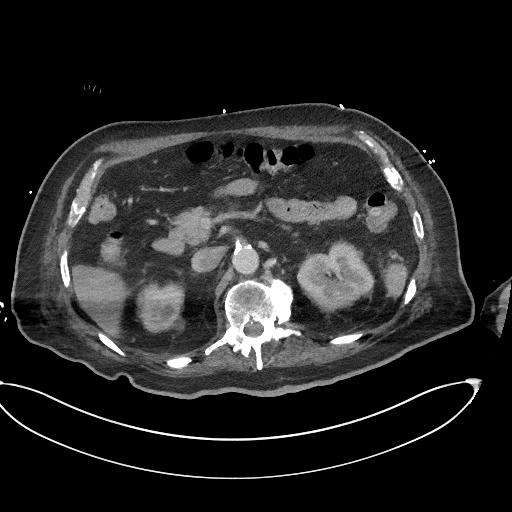
[im 148/222  soft-tissue]
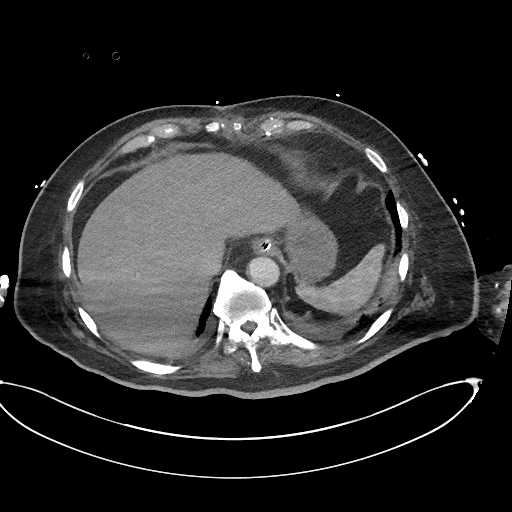
[im 160/222  soft-tissue]
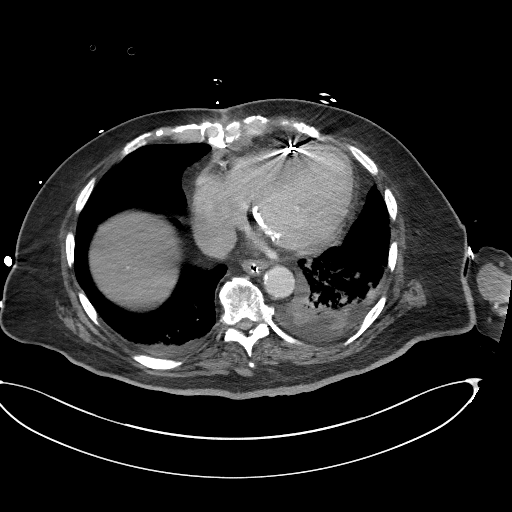
[im 185/222  soft-tissue]
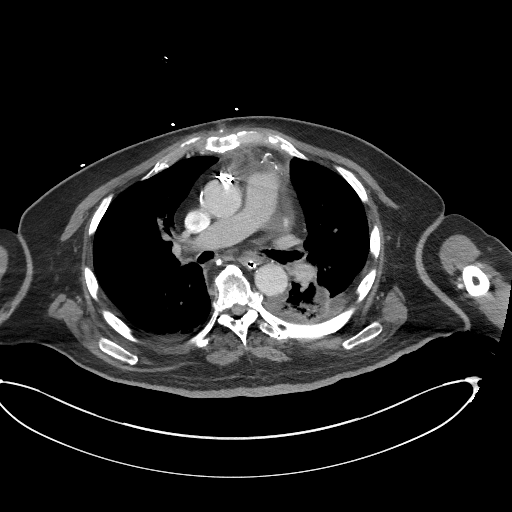
[im 185/222  bone]
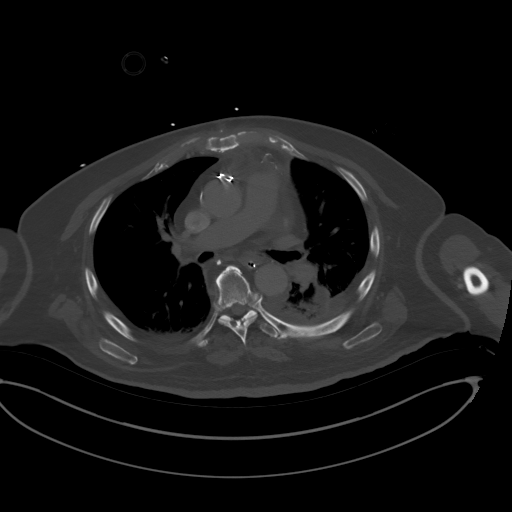
[im 209/222  soft-tissue]
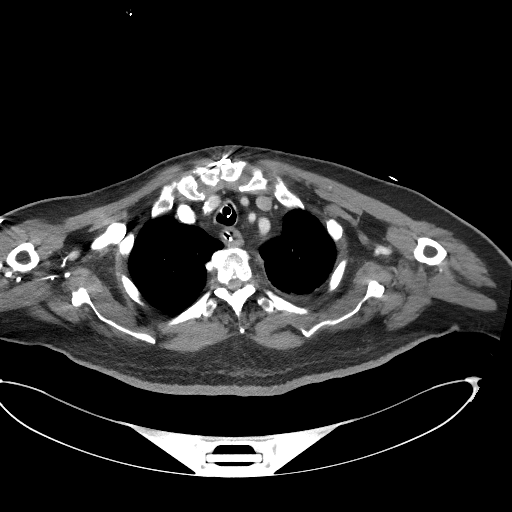

[Series 7: coronals · coronal · 1.01mm/px · 3 of 152 slices shown]
[im 38/152  soft-tissue]
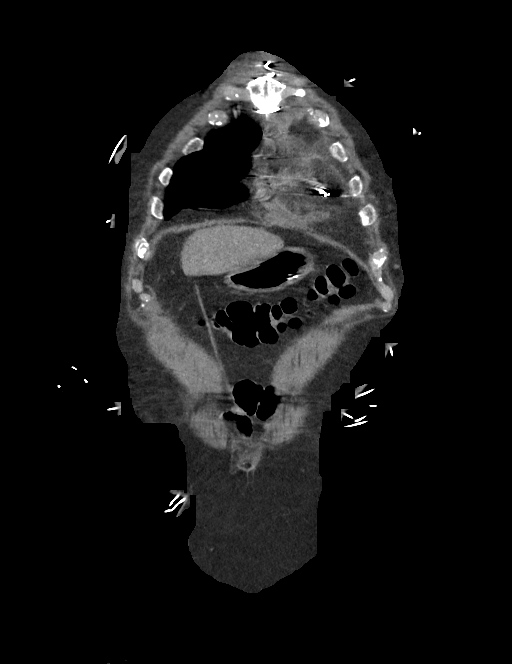
[im 76/152  soft-tissue]
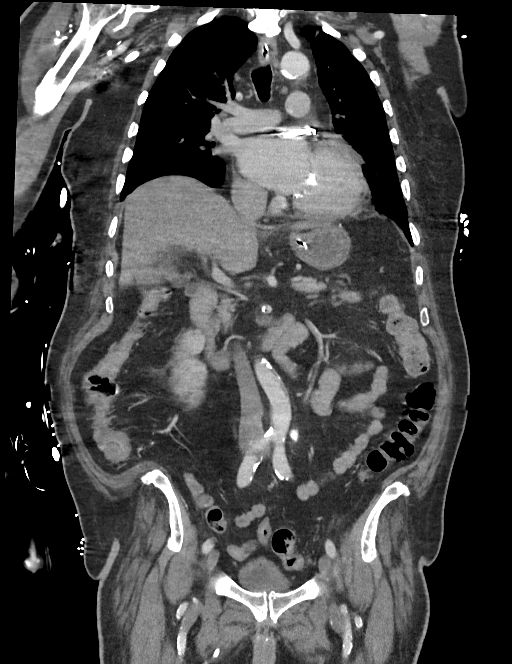
[im 114/152  soft-tissue]
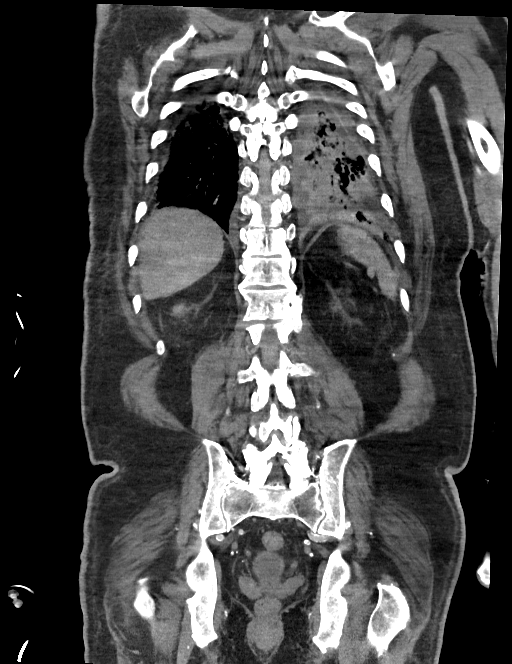

[13 of 46 positions shown; findings below may reference images not displayed]

Multidetector CT imaging through the chest, abdomen and pelvis was
performed using the standard protocol during bolus administration of
intravenous contrast. Multiplanar reconstructed images and MIPs were
obtained and reviewed to evaluate the vascular anatomy.

RADIATION DOSE REDUCTION: This exam was performed according to the
departmental dose-optimization program which includes automated
exposure control, adjustment of the mA and/or kV according to
patient size and/or use of iterative reconstruction technique.

CONTRAST:  100mL OMNIPAQUE IOHEXOL 350 MG/ML SOLN
FINDINGS: CTA CHEST FINDINGS

Cardiovascular: Changes from recent cardiac surgery and aortic valve
replacement. Heart is normal in size and overall configuration.
Coronary artery calcifications and stents. Prosthetic aortic valve
appears well positioned.

Thoracic aorta is normal in caliber. Mild atherosclerosis. No
dissection. No intramural hematoma or penetrating ulcer.

Mediastinum/Nodes: Postoperative edema noted along the anterior
mediastinum. No pericardial effusion. No neck base, mediastinal or
hilar masses or enlarged lymph nodes. Trachea unremarkable.
Endotracheal 2 lies 3.5 cm above the carina. Esophagus unremarkable.
Nasal/orogastric tube tip lies in the mid stomach.

Lungs/Pleura: Trace pleural effusions, larger on the left. Pleural
fluid on the left tracks along the oblique fissure.

Low-attenuation material noted in the left lower lobe bronchus
consistent with mucous plugging/secretions. There is partial left
lower lobe atelectasis with depression of the oblique fissure
posteriorly. Mild subsegmental atelectasis is noted in the dependent
right lower lobe adjacent to the trace pleural effusion. No
convincing pneumonia. No evidence of pulmonary edema. No
pneumothorax.

Musculoskeletal: Recent median sternotomy. No soft tissue fluid
collection to suggest an abscess. There is also a transverse
fracture or defect along the mid sternal body there is presumably
postoperative as well. No other fractures or defects. No bone
lesions.

Review of the MIP images confirms the above findings.

CTA ABDOMEN AND PELVIS FINDINGS

VASCULAR

Aorta: Aorta normal in caliber. Atherosclerotic plaque with no
significant stenosis. No dissection. No mural hematoma or
penetrating ulcer.

Celiac: Calcified atherosclerotic plaque at the origin of the celiac
axis. Estimated narrowing at least 70%.

SMA: Atherosclerotic plaque at the origin of the superior mesenteric
artery, vessel narrowed to 50%.

Renals: Both renal arteries are patent without evidence of aneurysm,
dissection, vasculitis, fibromuscular dysplasia or significant
stenosis.

IMA: Patent.

Inflow: Atherosclerotic plaque along the common and, to lesser
degree, right external iliac arteries. No significant stenosis. No
aneurysm or dissection.

Veins: No obvious venous abnormality within the limitations of this
arterial phase study.

Review of the MIP images confirms the above findings.

NON-VASCULAR

Hepatobiliary: Unremarkable liver. Small gallstone. No gallbladder
wall thickening or inflammation. No bile duct dilation.

Pancreas: Unremarkable. No pancreatic ductal dilatation or
surrounding inflammatory changes.

Spleen: Normal in size without focal abnormality.

Adrenals/Urinary Tract: No adrenal masses. Kidneys normal in size,
orientation and position. Chronic appearing bilateral perinephric
stranding/edema. No renal masses or stones. No hydronephrosis.
Normal ureters. Bladder partly decompressed with a Foley catheter.

Stomach/Bowel: Stomach unremarkable. Small bowel and colon are
normal in caliber. No wall thickening. No inflammation. No evidence
of appendicitis.

Lymphatic: No enlarged lymph nodes.

Reproductive: Unremarkable.

Other: No abdominal wall hernia or abnormality. No abdominopelvic
ascites.

Musculoskeletal: No fracture or acute finding.  No bone lesion.

Review of the MIP images confirms the above findings.
IMPRESSION: CTA FINDINGS

1. No thoracoabdominal aortic aneurysm or dissection. No evidence of
acute aortic syndrome.
2. Atherosclerotic narrowing of the celiac axis and superior
mesenteric arteries as detailed.

NON CTA FINDINGS

1. Trace pleural effusions. Left lower lobe partial atelectasis,
which appears due to secretions/mucus plugging in the left lower
lobe bronchus.
2. Recent cardiac surgery and aortic valve replacement. No evidence
an operative complication.
3. No acute findings within the abdomen or pelvis.

## 2021-11-16 IMAGING — CT CT HEAD W/O CM
4 series · 16 of 47 positions shown, 18 images · non-contrast
Comparison: [DATE]

CLINICAL DATA: Altered mental status.



[Series 2: head wo · axial · 0.42mm/px · z∈[-127,-2]mm · 7 of 35 slices shown, 9 images]
[im 5/35  brain]
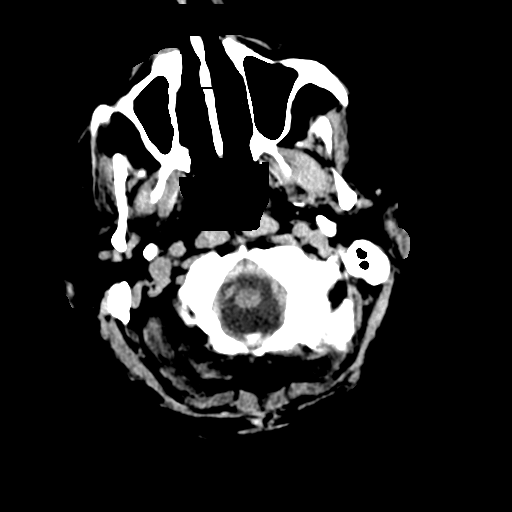
[im 5/35  bone]
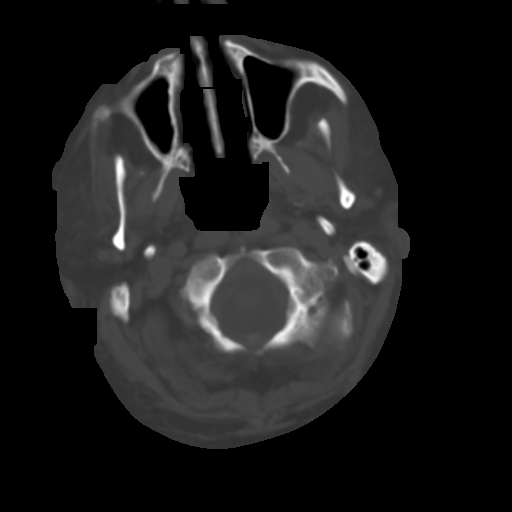
[im 9/35  brain]
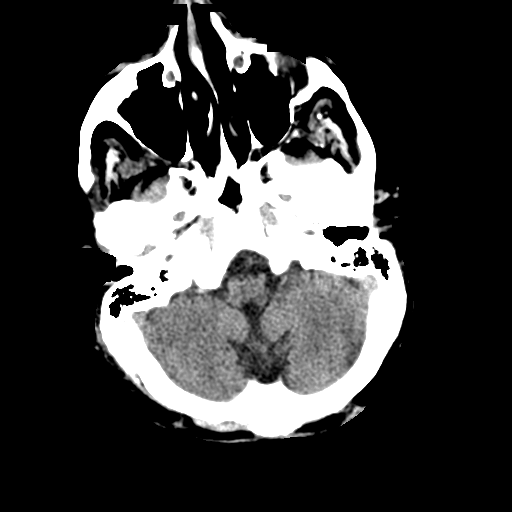
[im 13/35  brain]
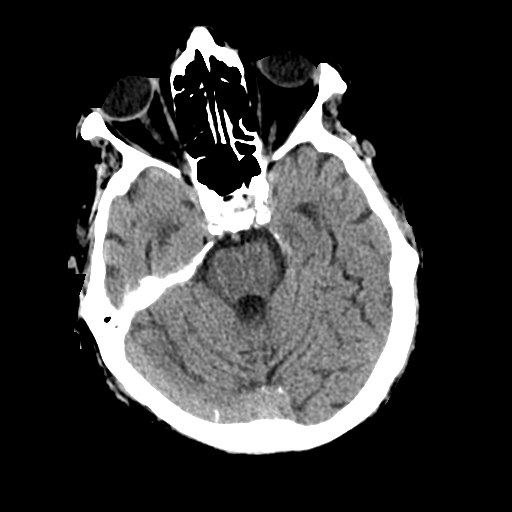
[im 18/35  brain]
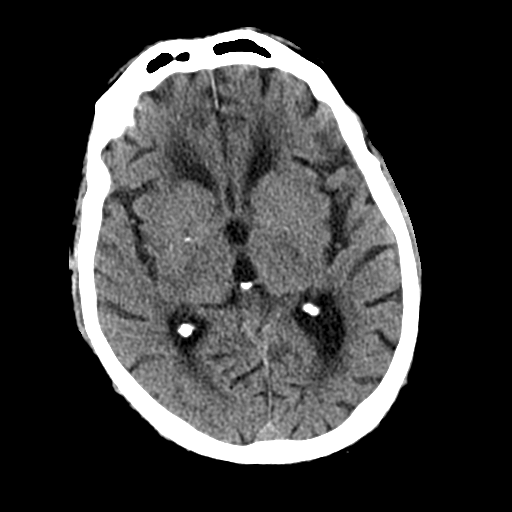
[im 22/35  brain]
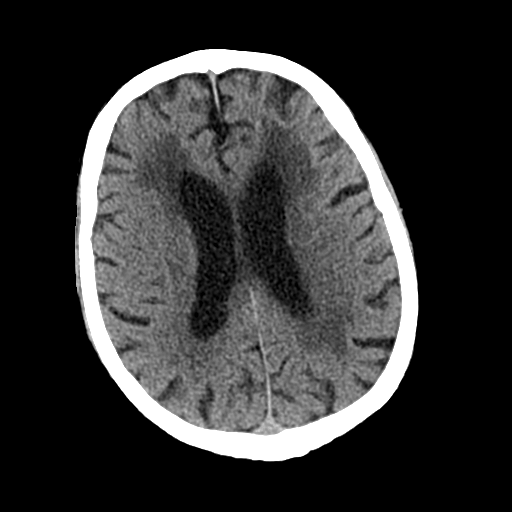
[im 22/35  bone]
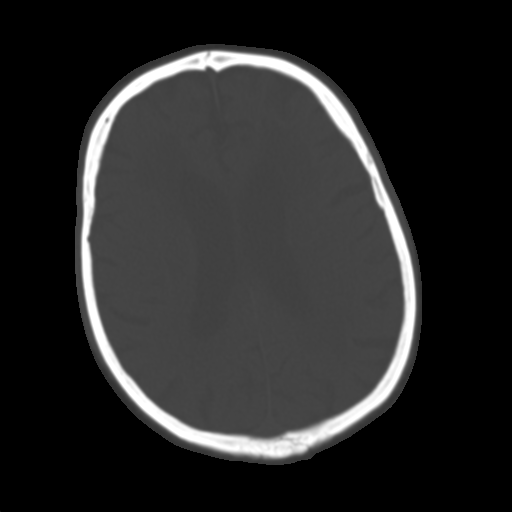
[im 26/35  brain]
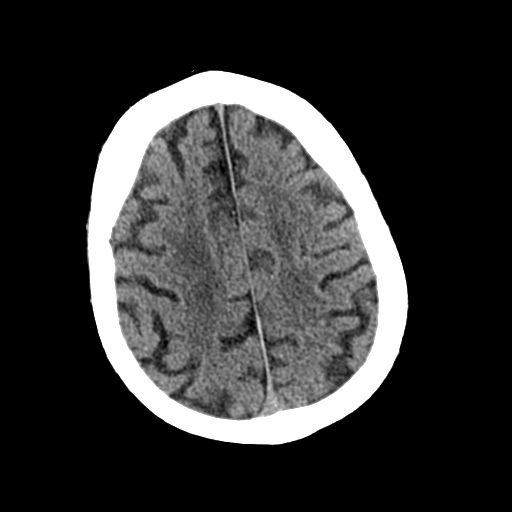
[im 30/35  brain]
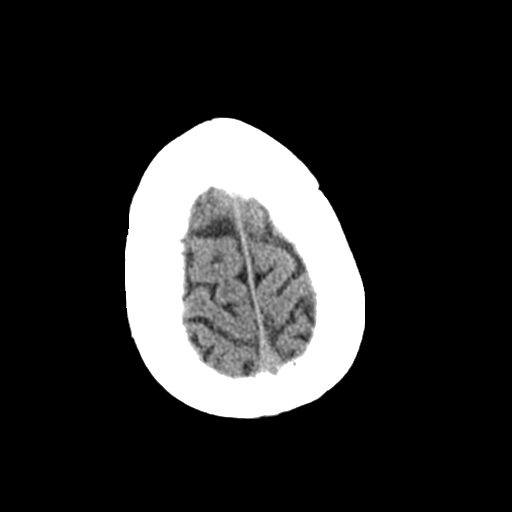

[Series 3: head bone · axial · 0.42mm/px · z∈[-131,-97]mm · 3 of 87 slices shown]
[im 9/87  bone]
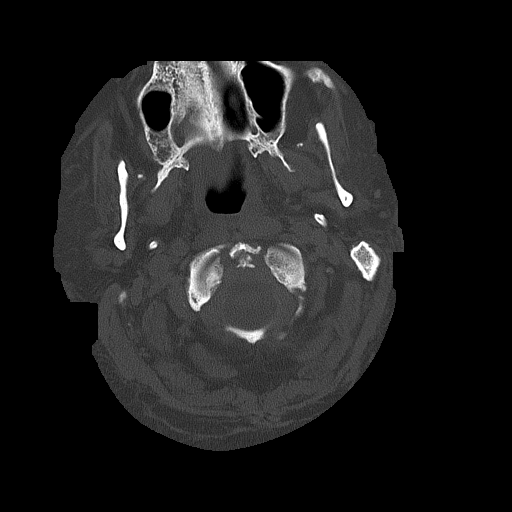
[im 18/87  bone]
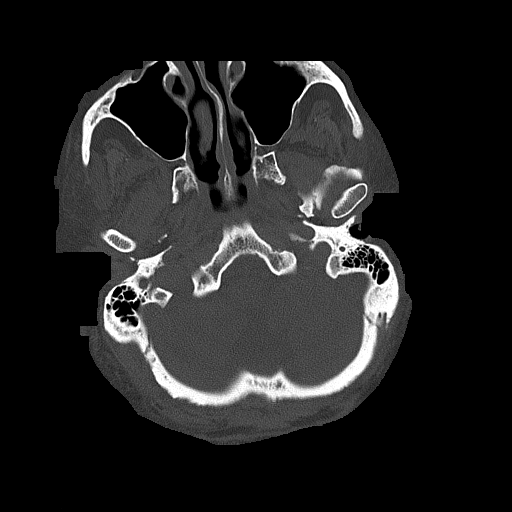
[im 26/87  bone]
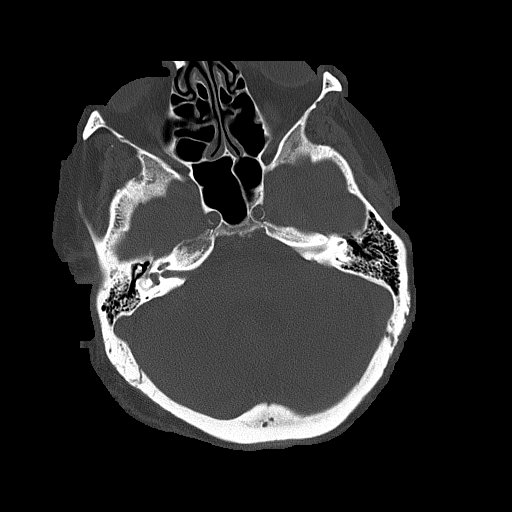

[Series 4: coronal soft tissue · coronal · 0.33mm/px · 3 of 73 slices shown]
[im 25/73  brain]
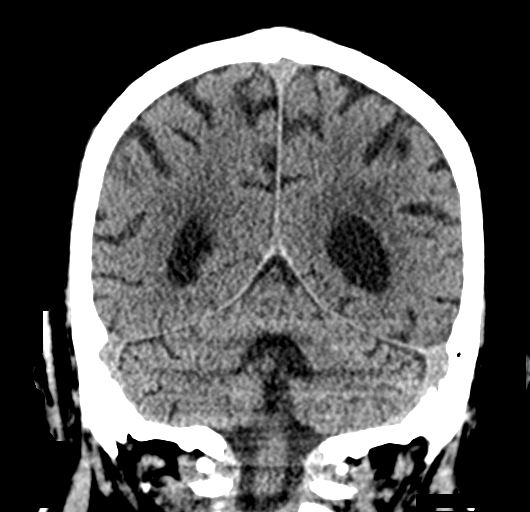
[im 33/73  brain]
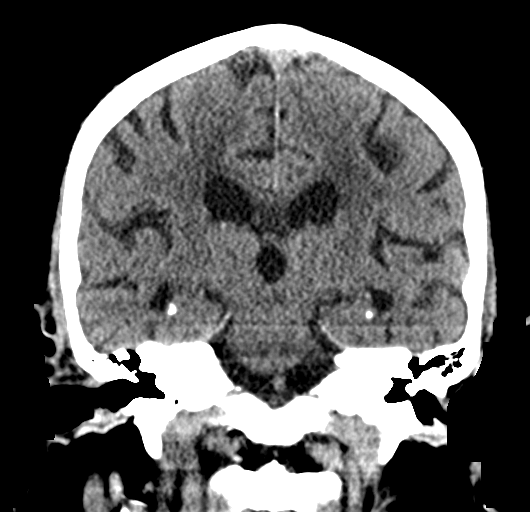
[im 41/73  brain]
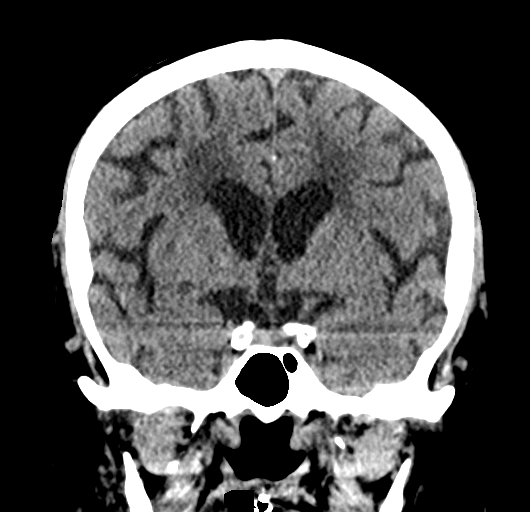

[Series 5: sagittal soft tissue · sagittal · 0.33mm/px · 3 of 57 slices shown]
[im 19/57  brain]
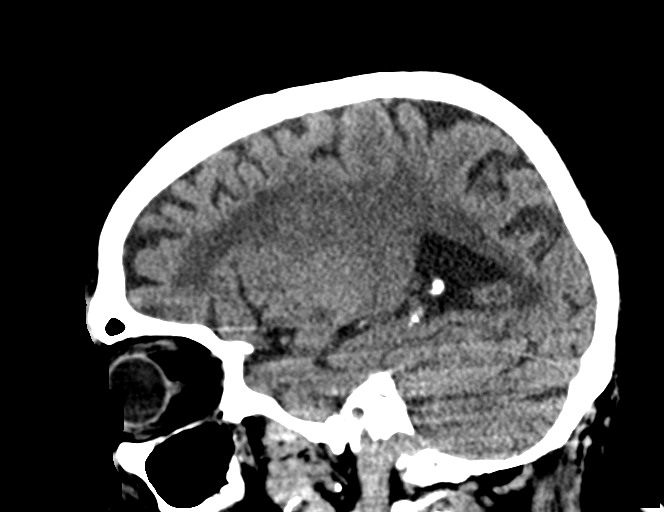
[im 29/57  brain]
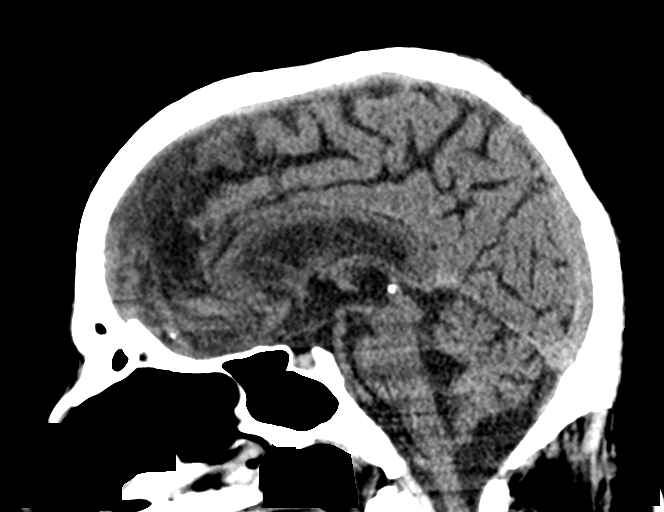
[im 38/57  brain]
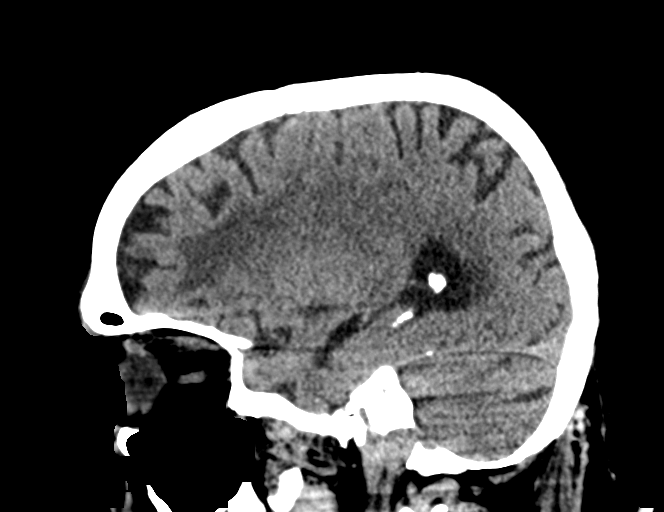

[16 of 47 positions shown; findings below may reference images not displayed]

FINDINGS: Brain: No evidence of intracranial hemorrhage, acute infarction,
hydrocephalus, extra-axial collection, or mass lesion/mass effect.
Mild diffuse cerebral atrophy is unchanged. Moderate to severe
chronic small vessel disease is also stable in appearance.

Vascular:  No hyperdense vessel or other acute findings.

Skull: No evidence of fracture or other significant bone
abnormality.

Sinuses/Orbits:  No acute findings.

Other: None.
IMPRESSION: No acute intracranial abnormality.

Stable cerebral atrophy and chronic small vessel disease.

## 2021-11-16 IMAGING — DX DG CHEST 1V PORT
1 series · 1 of 1 positions shown · non-contrast
Comparison: [DATE].

CLINICAL DATA: Possible sepsis.

EXAM:
PORTABLE CHEST 1 VIEW

[chest ap]
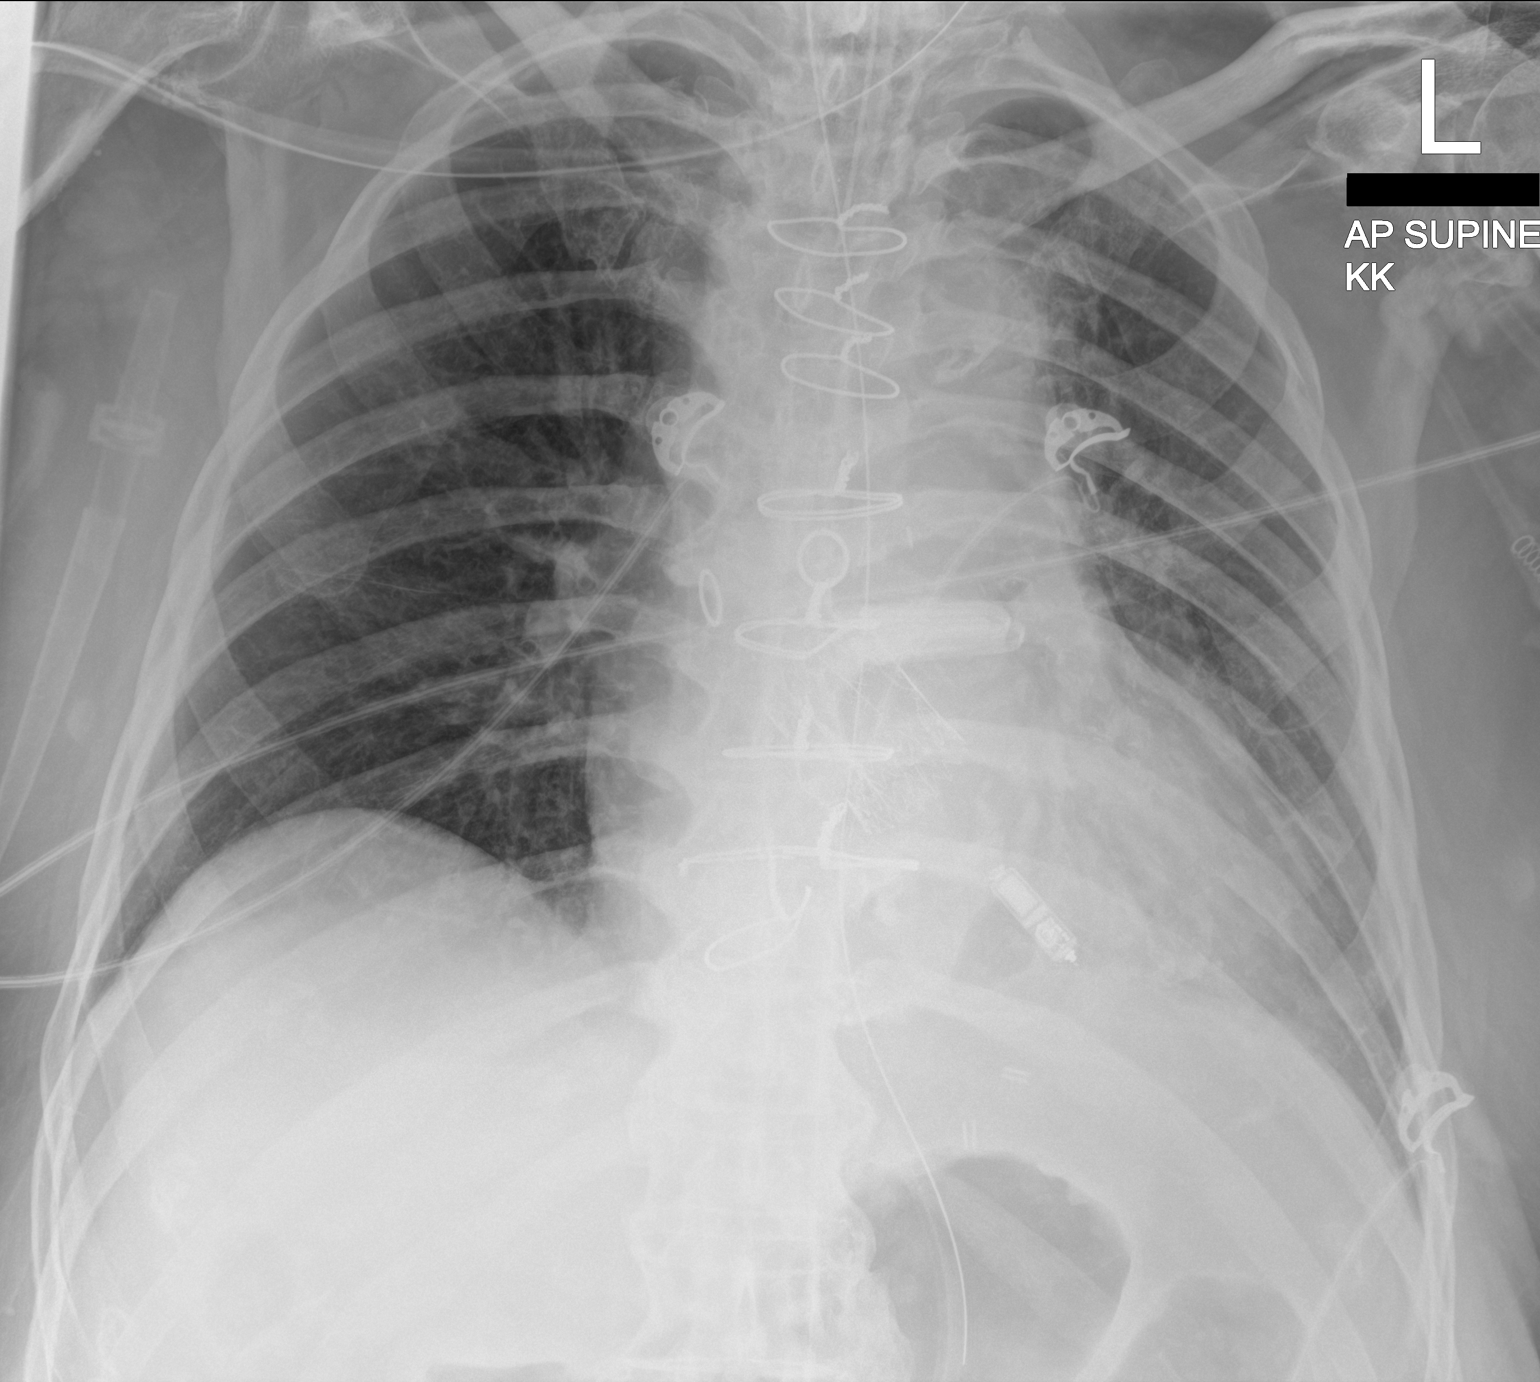

[1 of 1 positions shown; findings below may reference images not displayed]

FINDINGS: Since the previous chest radiograph, cardiac surgery has been
performed with valve replacement. Cardiac silhouette is normal in
size. No mediastinal widening.

Mild opacity at the left lung base, consistent with atelectasis.
Remainder of the lungs is clear. Possible small left effusion. No
evidence of a pneumothorax.

Endotracheal tube has its tip projecting 4.6 cm above the carina.
Nasal/orogastric tube passes below the diaphragm, well into the
stomach and below the included field of view.
IMPRESSION: 1. No acute findings. Mild left lung base opacity is consistent with
atelectasis. No convincing pneumonia and no evidence of pulmonary
edema.
2. Endotracheal tube and nasal/orogastric tube are well positioned.
3. Interval changes from cardiac surgery without evidence of an
operative complication.

## 2021-11-16 MED ORDER — SODIUM CHLORIDE 0.9 % IV SOLN
2.0000 g | Freq: Once | INTRAVENOUS | Status: AC
  Administered 2021-11-16: 2 g via INTRAVENOUS
  Filled 2021-11-16: qty 2

## 2021-11-16 MED ORDER — VANCOMYCIN HCL 1250 MG/250ML IV SOLN
1250.0000 mg | INTRAVENOUS | Status: DC
  Administered 2021-11-17: 1250 mg via INTRAVENOUS
  Filled 2021-11-16: qty 250

## 2021-11-16 MED ORDER — SODIUM CHLORIDE 0.9 % IV SOLN
3000.0000 mg | INTRAVENOUS | Status: AC
  Administered 2021-11-16: 3000 mg via INTRAVENOUS
  Filled 2021-11-16: qty 30

## 2021-11-16 MED ORDER — LORAZEPAM 2 MG/ML IJ SOLN
2.0000 mg | INTRAMUSCULAR | Status: AC
  Administered 2021-11-16: 2 mg via INTRAVENOUS

## 2021-11-16 MED ORDER — LACTATED RINGERS IV BOLUS (SEPSIS)
1000.0000 mL | Freq: Once | INTRAVENOUS | Status: AC
  Administered 2021-11-16: 1000 mL via INTRAVENOUS

## 2021-11-16 MED ORDER — SODIUM CHLORIDE 0.9 % IV SOLN: INTRAVENOUS | Status: DC

## 2021-11-16 MED ORDER — LACTATED RINGERS IV SOLN: INTRAVENOUS | Status: DC

## 2021-11-16 MED ORDER — PROPOFOL 1000 MG/100ML IV EMUL
40.0000 ug/kg/min | INTRAVENOUS | Status: AC
  Administered 2021-11-16: 20:00:00 40 ug/kg/min via INTRAVENOUS
  Administered 2021-11-16: 50 ug/kg/min via INTRAVENOUS
  Administered 2021-11-16: 40 ug/kg/min via INTRAVENOUS
  Administered 2021-11-17: 03:00:00 35 ug/kg/min via INTRAVENOUS
  Administered 2021-11-17: 08:00:00 20 ug/kg/min via INTRAVENOUS
  Administered 2021-11-19 – 2021-11-20 (×7): 40 ug/kg/min via INTRAVENOUS
  Administered 2021-11-21: 20 ug/kg/min via INTRAVENOUS
  Administered 2021-11-21 (×2): 40 ug/kg/min via INTRAVENOUS
  Filled 2021-11-16 (×14): qty 100

## 2021-11-16 MED ORDER — SODIUM CHLORIDE 0.9 % IV BOLUS
1000.0000 mL | Freq: Once | INTRAVENOUS | Status: AC
  Administered 2021-11-16: 1000 mL via INTRAVENOUS

## 2021-11-16 MED ORDER — DEXTROSE 5 % IV SOLN
10.0000 mg/kg | Freq: Two times a day (BID) | INTRAVENOUS | Status: DC
  Filled 2021-11-16 (×2): qty 17.2

## 2021-11-16 MED ORDER — IOHEXOL 350 MG/ML SOLN: 60.0000 mL | Freq: Once | INTRAVENOUS | Status: DC | PRN

## 2021-11-16 MED ORDER — VITAL HIGH PROTEIN PO LIQD
1000.0000 mL | ORAL | Status: DC
  Administered 2021-11-16 – 2021-11-17 (×2): 1000 mL

## 2021-11-16 MED ORDER — ORAL CARE MOUTH RINSE
15.0000 mL | OROMUCOSAL | Status: DC
  Administered 2021-11-16 – 2021-11-22 (×55): 15 mL via OROMUCOSAL

## 2021-11-16 MED ORDER — LORAZEPAM 2 MG/ML IJ SOLN
INTRAMUSCULAR | Status: AC
  Filled 2021-11-16: qty 1

## 2021-11-16 MED ORDER — IOHEXOL 350 MG/ML SOLN
100.0000 mL | Freq: Once | INTRAVENOUS | Status: AC | PRN
  Administered 2021-11-16: 100 mL via INTRAVENOUS

## 2021-11-16 MED ORDER — SODIUM CHLORIDE 0.9 % IV SOLN
750.0000 mg | Freq: Two times a day (BID) | INTRAVENOUS | Status: DC
  Filled 2021-11-16 (×2): qty 7.5

## 2021-11-16 MED ORDER — MIDAZOLAM HCL 2 MG/2ML IJ SOLN
1.0000 mg | INTRAMUSCULAR | Status: DC | PRN
  Administered 2021-11-18 (×2): 1 mg via INTRAVENOUS
  Filled 2021-11-16: qty 2

## 2021-11-16 MED ORDER — CHLORHEXIDINE GLUCONATE CLOTH 2 % EX PADS
6.0000 | MEDICATED_PAD | Freq: Every day | CUTANEOUS | Status: DC
  Administered 2021-11-16 – 2021-11-21 (×6): 6 via TOPICAL

## 2021-11-16 MED ORDER — PANTOPRAZOLE 2 MG/ML SUSPENSION
40.0000 mg | Freq: Every day | ORAL | Status: DC
  Administered 2021-11-16 – 2021-11-22 (×7): 40 mg
  Filled 2021-11-16 (×7): qty 20

## 2021-11-16 MED ORDER — LEVETIRACETAM IN NACL 500 MG/100ML IV SOLN: 500.0000 mg | Freq: Two times a day (BID) | INTRAVENOUS | Status: DC

## 2021-11-16 MED ORDER — SODIUM CHLORIDE 0.9 % IV SOLN: INTRAVENOUS | Status: DC | PRN

## 2021-11-16 MED ORDER — LEVETIRACETAM IN NACL 1000 MG/100ML IV SOLN
INTRAVENOUS | Status: AC
  Administered 2021-11-16: 1000 mg via INTRAVENOUS
  Filled 2021-11-16: qty 100

## 2021-11-16 MED ORDER — METRONIDAZOLE 500 MG/100ML IV SOLN
500.0000 mg | Freq: Once | INTRAVENOUS | Status: AC
  Administered 2021-11-16: 500 mg via INTRAVENOUS
  Filled 2021-11-16: qty 100

## 2021-11-16 MED ORDER — SODIUM CHLORIDE 0.9 % IV SOLN
4000.0000 mg | INTRAVENOUS | Status: DC
  Filled 2021-11-16: qty 40

## 2021-11-16 MED ORDER — LEVETIRACETAM IN NACL 1000 MG/100ML IV SOLN: 1000.0000 mg | Freq: Once | INTRAVENOUS | Status: AC

## 2021-11-16 MED ORDER — FENTANYL CITRATE PF 50 MCG/ML IJ SOSY
25.0000 ug | PREFILLED_SYRINGE | INTRAMUSCULAR | Status: DC | PRN
  Administered 2021-11-18 – 2021-11-21 (×5): 50 ug via INTRAVENOUS
  Filled 2021-11-16 (×4): qty 1

## 2021-11-16 MED ORDER — PRAVASTATIN SODIUM 10 MG PO TABS
20.0000 mg | ORAL_TABLET | Freq: Every day | ORAL | Status: DC
  Administered 2021-11-16 – 2021-11-21 (×6): 20 mg via JEJUNOSTOMY
  Filled 2021-11-16 (×7): qty 2

## 2021-11-16 MED ORDER — PROPOFOL 1000 MG/100ML IV EMUL
5.0000 ug/kg/min | INTRAVENOUS | Status: DC
  Administered 2021-11-16: 40 ug/kg/min via INTRAVENOUS
  Filled 2021-11-16: qty 100

## 2021-11-16 MED ORDER — SODIUM CHLORIDE 0.9 % IV SOLN
750.0000 mg | Freq: Two times a day (BID) | INTRAVENOUS | Status: DC
  Administered 2021-11-16 – 2021-11-18 (×5): 750 mg via INTRAVENOUS
  Filled 2021-11-16 (×7): qty 7.5

## 2021-11-16 MED ORDER — INSULIN ASPART 100 UNIT/ML IJ SOLN
0.0000 [IU] | INTRAMUSCULAR | Status: DC
  Administered 2021-11-16 (×2): 2 [IU] via SUBCUTANEOUS
  Administered 2021-11-17: 3 [IU] via SUBCUTANEOUS
  Administered 2021-11-17 (×2): 2 [IU] via SUBCUTANEOUS
  Administered 2021-11-17 (×2): 3 [IU] via SUBCUTANEOUS
  Administered 2021-11-17: 2 [IU] via SUBCUTANEOUS
  Administered 2021-11-18: 3 [IU] via SUBCUTANEOUS
  Administered 2021-11-18: 2 [IU] via SUBCUTANEOUS
  Administered 2021-11-18: 5 [IU] via SUBCUTANEOUS
  Administered 2021-11-19: 3 [IU] via SUBCUTANEOUS
  Administered 2021-11-19 (×2): 5 [IU] via SUBCUTANEOUS

## 2021-11-16 MED ORDER — SODIUM CHLORIDE 0.9 % IV SOLN: 250.0000 mL | INTRAVENOUS | Status: DC

## 2021-11-16 MED ORDER — POLYETHYLENE GLYCOL 3350 17 G PO PACK
17.0000 g | PACK | Freq: Every day | ORAL | Status: DC
  Administered 2021-11-16 – 2021-11-22 (×6): 17 g
  Filled 2021-11-16 (×6): qty 1

## 2021-11-16 MED ORDER — LEVETIRACETAM IN NACL 1000 MG/100ML IV SOLN: 1000.0000 mg | Freq: Two times a day (BID) | INTRAVENOUS | Status: DC

## 2021-11-16 MED ORDER — ACETAMINOPHEN 325 MG PO TABS
650.0000 mg | ORAL_TABLET | Freq: Four times a day (QID) | ORAL | Status: DC | PRN
  Administered 2021-11-17 – 2021-11-21 (×5): 650 mg
  Filled 2021-11-16 (×5): qty 2

## 2021-11-16 MED ORDER — SODIUM CHLORIDE 0.9 % IV SOLN: 2.0000 g | Freq: Two times a day (BID) | INTRAVENOUS | Status: DC

## 2021-11-16 MED ORDER — IPRATROPIUM-ALBUTEROL 0.5-2.5 (3) MG/3ML IN SOLN: 3.0000 mL | RESPIRATORY_TRACT | Status: DC | PRN

## 2021-11-16 MED ORDER — VANCOMYCIN HCL IN DEXTROSE 1-5 GM/200ML-% IV SOLN
1000.0000 mg | Freq: Once | INTRAVENOUS | Status: AC
  Administered 2021-11-16: 1000 mg via INTRAVENOUS
  Filled 2021-11-16: qty 200

## 2021-11-16 MED ORDER — SODIUM CHLORIDE 0.9 % IV SOLN
INTRAVENOUS | Status: DC
Start: 2021-11-16 — End: 2021-11-16

## 2021-11-16 MED ORDER — DOCUSATE SODIUM 50 MG/5ML PO LIQD
100.0000 mg | Freq: Two times a day (BID) | ORAL | Status: DC
  Administered 2021-11-16 – 2021-11-22 (×10): 100 mg
  Filled 2021-11-16 (×10): qty 10

## 2021-11-16 MED ORDER — SODIUM CHLORIDE 0.9 % IV SOLN
2.0000 g | Freq: Four times a day (QID) | INTRAVENOUS | Status: DC
  Filled 2021-11-16 (×2): qty 2000

## 2021-11-16 MED ORDER — NOREPINEPHRINE 4 MG/250ML-% IV SOLN
2.0000 ug/min | INTRAVENOUS | Status: DC
  Administered 2021-11-16 – 2021-11-17 (×3): 2 ug/min via INTRAVENOUS
  Filled 2021-11-16 (×2): qty 250

## 2021-11-16 MED ORDER — CHLORHEXIDINE GLUCONATE 0.12% ORAL RINSE (MEDLINE KIT)
15.0000 mL | Freq: Two times a day (BID) | OROMUCOSAL | Status: DC
  Administered 2021-11-16 – 2021-11-22 (×12): 15 mL via OROMUCOSAL

## 2021-11-16 MED ORDER — DEXTROSE 5 % IV SOLN
860.0000 mg | Freq: Two times a day (BID) | INTRAVENOUS | Status: DC
  Administered 2021-11-16 – 2021-11-17 (×2): 860 mg via INTRAVENOUS
  Filled 2021-11-16 (×4): qty 17.2

## 2021-11-16 NOTE — Progress Notes (Signed)
Patient arrived to 4N23 on Propofol, NS, and LR. CCM MD at bedside. RT placed pt on our vent. New orders written. Neurology notified of pt's arrival. No personal belongings with pt from transport. Bryndan Bilyk, [place] Scrape, RN

## 2021-11-16 NOTE — Progress Notes (Signed)
Pt transported to and from CT with RN twice with out incident

## 2021-11-16 NOTE — ED Triage Notes (Signed)
Pt to ED via [facility] from home. Per EMS pt went to bed at baseline last night. This morning pts wife was not able to wake him up. Pt has been unresponsive with EMS. EMS also reports that pt had 2 seizures in route. Pt was given 2.5 mg of versed. Pt was also given D10 in route for possible hypoglycemia. CBG was 81. Pt arrives to ED with GCS of 3. Pt on non-rebreather. 20 G IV in Left AC.

## 2021-11-16 NOTE — ED Notes (Signed)
Report called, initiating transport, family at [facility], no changes, VSS.

## 2021-11-16 NOTE — ED Notes (Signed)
20mg Etomidate

## 2021-11-16 NOTE — ED Notes (Signed)
100 mg Rocc

## 2021-11-16 NOTE — Consult Note (Signed)
PHARMACY -  BRIEF ANTIBIOTIC NOTE   Pharmacy has received consult(s) for Vancomycin and Cefepime from an ED provider.  The patient's profile has been reviewed for ht/wt/allergies/indication/available labs.    One time order(s) placed for: Vancomycin 1g IV x1 Cefepime 2g IV x1 Also receiving Metronidazole 500mg  IV x1  Further antibiotics/pharmacy consults should be ordered by admitting physician if indicated.                       Thank you, , PharmD, [facility] Clinical Pharmacist 10/23/2021 10:51 AM

## 2021-11-16 NOTE — Progress Notes (Signed)
Pharmacy Antibiotic Note  Harry [name] is a 81 y.o. 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  Pharmacy has been consulted for acyclovir dosing.  Plan: Acyclovir 860 mg IV q12h with NS @125  ml/hr Monitor renal function and adjust dose as clinically indicated  Weight: 86 kg (189 lb 9.5 oz)  Temp (24hrs), Avg:97.5 F (36.4 C), Min:95.1 F (35.1 C), Max:98 F (36.7 C)  Recent Labs  Lab December 08, 2021 1006  WBC 14.4*  CREATININE 1.60*  LATICACIDVEN 7.2*    Estimated Creatinine Clearance: 39.2 mL/min (A) (by C-G formula based on SCr of 1.6 mg/dL (H)).    Allergies  Allergen Reactions   Atorvastatin     Other reaction(s): Muscle Pain   Bupropion Other (See Comments)    Other reaction(s): Dizziness CNS side effects   Citalopram     Other reaction(s): Other (See Comments) GI side effects   Pioglitazone     Other reaction(s): Muscle Pain    Antimicrobials this admission: 2/25 cefepime, vanc, metronidazole x 1 2/25 acyclovir >>    Microbiology results: 2/25 BCx: sent  Thank you for allowing pharmacy to be a part of this patients care.  3/25 Harry [name] 12/08/2021 12:58 PM

## 2021-11-16 NOTE — Progress Notes (Signed)
Neurology Progress Note  Brief HPI: 81 y.o. male who was found by his wife this morning with his top half hanging off of the bed with his eyes open but would not fixate or track his wife and with incomprehensible sounds. Patient's GCS remained poor on arrival to [facility] and he was intubated for airway protection. 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 Patient was transferred to [facility] for continuous EEG monitoring and further work up and management.   Subjective: Patient remains intubated and sedated on arrival to [facility] as a transfer from [facility].   Objective: Vitals:   11-Dec-2021 1530  BP: 104/70  Pulse: 76  Resp: 18  Temp: (!) 97.3 F (36.3 C)  SpO2: 99%   Gen: Intubated and sedated in the ICU, sedation not paused for examination, propofol at 50 mcg/kg/min Resp: respirations assisted via mechanical ventilator, no spontaneous respirations Abd: soft, non-distended  Neuro: He does not open his eyes or follow commands. Pupils are 2 mm and sluggishly reactive to light, no movement to doll's eyes, negative corneal reflexes, + cough and gag reflexes. There is minimal, non-purposeful extensor posturing movement that is spontaneous involving the left upper extremity with a triple flexion response to the left lower extremity spontaneously with noxious stimuli. Minimal flickering movement with application of noxious stimuli to right upper and lower extremities.   Pertinent Labs: CBC    Component Value Date/Time   WBC 14.4 (H) 12-11-21 1006   RBC 5.02 12-11-21 1006   HGB 12.3 (L) 12/11/21 1006   HGB 15.1 01/29/2012 0030   HCT 42.6 12/11/2021 1006   HCT 46.0 01/29/2012 0030   PLT 175 12/11/2021 1006   PLT 201 01/29/2012 0030   MCV 84.9 12-11-2021 1006   MCV 87 01/29/2012 0030   MCH 24.5 (L) 12-11-21 1006   MCHC 28.9 (L) 12/11/2021 1006    RDW 21.6 (H) 12/11/21 1006   RDW 14.5 01/29/2012 0030   LYMPHSABS 5.5 (H) 2021-12-11 1006   MONOABS 0.8 Dec 11, 2021 1006   EOSABS 0.3 12-11-21 1006   BASOSABS 0.2 (H) 2021/12/11 1006   CMP     Component Value Date/Time   NA 136 December 11, 2021 1006   NA 139 01/29/2012 0030   K 4.4 12/11/21 1006   K 4.7 01/29/2012 0030   CL 104 12/11/2021 1006   CL 104 01/29/2012 0030   CO2 13 (L) 11-Dec-2021 1006   CO2 25 01/29/2012 0030   GLUCOSE 175 (H) 12-11-2021 1006   GLUCOSE 209 (H) 01/29/2012 0030   BUN 33 (H) 12-11-21 1006   BUN 13 01/29/2012 0030   CREATININE 1.60 (H) 12/11/21 1006   CREATININE 0.81 01/29/2012 0030   CALCIUM 8.9 Dec 11, 2021 1006   CALCIUM 9.2 01/29/2012 0030   PROT 7.6 12-11-2021 1006   ALBUMIN 3.4 (L) 12-11-21 1006   AST 35 12/11/2021 1006   ALT 19 12/11/21 1006   ALKPHOS 94 Dec 11, 2021 1006   BILITOT 0.8 December 11, 2021 1006   GFRNONAA 44 (L) 12-11-2021 1006   GFRNONAA >60 01/29/2012 0030   GFRAA >60 09/03/2016 1625   GFRAA >60 01/29/2012 0030   Lactic Acid, Venous    Component Value Date/Time   LATICACIDVEN 3.7 (HH) 2021/12/11 1206    Latest Reference Range & Units 2021/12/11 11:57  Influenza A By PCR NEGATIVE  NEGATIVE  Influenza B By PCR NEGATIVE  NEGATIVE  SARS Coronavirus 2 by RT PCR NEGATIVE  NEGATIVE   Ammonia 2/25: 10  Urinalysis    Component Value Date/Time   COLORURINE YELLOW (A) 11-27-2021 1006   APPEARANCEUR HAZY (A) 11-27-21 1006   APPEARANCEUR Clear 06/12/2021 0856   LABSPEC 1.020 Nov 27, 2021 1006   LABSPEC 1.010 01/29/2012 1600   PHURINE 5.0 Nov 27, 2021 1006   GLUCOSEU NEGATIVE 11-27-2021 1006   GLUCOSEU 50 mg/dL 52/84/1324 4010   HGBUR NEGATIVE 2021-11-27 1006   BILIRUBINUR NEGATIVE 11/27/21 1006   BILIRUBINUR Negative 06/12/2021 0856   BILIRUBINUR Negative 01/29/2012 1600   KETONESUR NEGATIVE 2021-11-27 1006   PROTEINUR 30 (A) 11/27/21 1006   NITRITE NEGATIVE 27-Nov-2021 1006   LEUKOCYTESUR NEGATIVE 11-27-2021 1006    LEUKOCYTESUR Negative 01/29/2012 1600   Lab Results  Component Value Date   TSH 1.110 November 27, 2021   Imaging Reviewed:  [facility] 2/25: No acute intracranial abnormality. 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  CTA head and neck 2/25: No significant change since prior study. No large vessel occlusion, hemodynamically significant stenosis, or evidence of dissection.  Routine EEG 2/25: "Clinical Interpretation: This EEG recorded frequent seizures in the left frontal region, essentially amounting to partial status epilepticus."  Assessment: 81 yo male with acute onset coma. EEG revealed partial status epilepticus. Transferred from [facility] for LTM EEG.  - DDx epileptic: Partial status epilepticus is the most likely etiology for his presentation.  - DDx vascular: No evidence of basilar occlusion on angiographic imaging. Embolic shower without LVO would be another consideration, will obtain MRI brain when patient is stabilized. - DDx infectious: No meningismus to suggest meningitis, INR too high for LP. Prefer CNS coverage pending LP (48 hours following last Eliquis dosing). Septic encephalopathy is also possible.  - Other DDx: Another consideration would be prolonged hypotension from afib/RVR that spontaneously converted. This is felt to be the least likely etiology for his presentation.  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    Recommendations: - Overnight EEG ordered - MRI brain once stable - Continue propofol - Keppra maintenance dosing: 750 mg IV BID (maximum given his renal dysfunction) - Will need LP once off of Eliquis for 48 hours  - Agree with CNS and sepsis ABX coverage  Lanae Boast, AGACNP-BC [facility] [phone]  I have seen and examined the patient. I have discussed the assessment and  recommendations with the Neurology NP and made amendations as needed.  Electronically signed: Dr. Caryl Pina

## 2021-11-16 NOTE — ED Notes (Signed)
Dr. Roxan Hockey at bedside preparing to intubate pt

## 2021-11-16 NOTE — ED Notes (Signed)
CT complete

## 2021-11-16 NOTE — ED Provider Notes (Signed)
[facility] Provider Note    Event Date/Time   First MD Initiated Contact with Patient 11/13/2021 [phone]     (approximate)   History   unresponsive   HPI  Harry [name] is a 81 y.o. 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  Last seen normal was last night.  EMS found the patient minimally responsive no purposeful movement had 2 witnessed brief seizure like activities with diffuse jerking motion receiving Versed.  Got D10 for CBG of 81.  No additional history provided.  I have attempted to contact the patient's spouse but goes directly to voicemail.      Physical Exam   Triage Vital Signs: ED Triage Vitals  Enc Vitals Group     BP 11/17/2021 0948 126/65     Pulse Rate 11/11/2021 0948 85     Resp 11/06/2021 0948 19     Temp --      Temp src --      SpO2 11/02/2021 0948 97 %     Weight 11/03/2021 0956 189 lb 9.5 oz (86 kg)     Height --      Head Circumference --      Peak Flow --      Pain Score --      Pain Loc --      Pain Edu? --      Excl. in [place]? --     Most recent vital signs: Vitals:   11/15/2021 1215 11/18/2021 1230  BP: (!) 149/95 (!) 151/88  Pulse: 85 (!) 38  Resp: 20 (!) 23  Temp: (!) 96.8 F (36 C) (!) 96.4 F (35.8 C)  SpO2: 99% 99%     Constitutional: unresponsive, spontaneous resp. GCS 3 Eyes: Conjunctivae are normal. Pupils midpoint. Right gaze preference Head: Atraumatic. Nose: No congestion/rhinnorhea. Mouth/Throat: Mucous membranes are moist.   Neck: Painless ROM.  Cardiovascular:   Good peripheral circulation. Respiratory: Normal respiratory effort.  No retractions.  Gastrointestinal: Soft and nontender.  Musculoskeletal:  no deformity Neurologic:  gcs 3, no movement or response to painful stimuli Skin:  Skin is warm, dry and intact. No rash noted. Psychiatric: unable to assess    ED Results / Procedures / Treatments   Labs (all labs ordered  are listed, but only abnormal results are displayed) Labs Reviewed  LACTIC ACID, PLASMA - Abnormal; Notable for the following components:      Result Value   Lactic Acid, Venous 7.2 (*)    All other components within normal limits  COMPREHENSIVE METABOLIC PANEL - Abnormal; Notable for the following components:   CO2 13 (*)    Glucose, Bld 175 (*)    BUN 33 (*)    Creatinine, Ser 1.60 (*)    Albumin 3.4 (*)    GFR, Estimated 44 (*)    Anion gap 19 (*)    All other components within normal limits  CBC WITH DIFFERENTIAL/PLATELET - Abnormal; Notable for the following components:   WBC 14.4 (*)    Hemoglobin 12.3 (*)    MCH 24.5 (*)    MCHC 28.9 (*)    RDW 21.6 (*)    Lymphs Abs 5.5 (*)    Basophils Absolute 0.2 (*)    Abs Immature Granulocytes 0.08 (*)    All other components within normal limits  PROTIME-INR - Abnormal; Notable for the following components:   Prothrombin Time 20.3 (*)  INR 1.7 (*)    All other components within normal limits  APTT - Abnormal; Notable for the following components:   aPTT 41 (*)    All other components within normal limits  URINALYSIS, COMPLETE (UACMP) WITH MICROSCOPIC - Abnormal; Notable for the following components:   Color, Urine YELLOW (*)    APPearance HAZY (*)    Protein, ur 30 (*)    Bacteria, UA RARE (*)    All other components within normal limits  BLOOD GAS, ARTERIAL - Abnormal; Notable for the following components:   pCO2 arterial 22 (*)    pO2, Arterial 143 (*)    Bicarbonate 13.3 (*)    Acid-base deficit 9.5 (*)    All other components within normal limits  CBG MONITORING, ED - Abnormal; Notable for the following components:   Glucose-Capillary 165 (*)    All other components within normal limits  CULTURE, BLOOD (ROUTINE X 2)  CULTURE, BLOOD (ROUTINE X 2)  RESP PANEL BY RT-PCR (FLU A&B, COVID) ARPGX2  LACTIC ACID, PLASMA  TSH  AMMONIA  TROPONIN I (HIGH SENSITIVITY)     EKG  ED ECG REPORT I, Merlyn Lot, the  attending physician, personally viewed and interpreted this ECG.   Date: 11/15/2021  EKG Time: 9:56  Rate: 90  Rhythm: sinus  Axis: left  Intervals: lbbb  ST&T Change: lbbb, no stemi criteria, nonspecific st abn    RADIOLOGY Please see ED Course for my review and interpretation.  I personally reviewed all radiographic images ordered to evaluate for the above acute complaints and reviewed radiology reports and findings.  These findings were personally discussed with the patient.  Please see medical record for radiology report.    PROCEDURES:  Critical Care performed: Yes, see critical care procedure note(s)   Procedure Name: Intubation Date/Time: 10/31/2021 10:00 AM Performed by: Merlyn Lot, MD Pre-anesthesia Checklist: Patient identified, Emergency Drugs available, Suction available and Patient being monitored Preoxygenation: Pre-oxygenation with 100% oxygen Induction Type: IV induction and Rapid sequence Laryngoscope Size: Glidescope and 3 Grade View: Grade II Tube size: 7.5 mm Number of attempts: 1 Airway Equipment and Method: Video-laryngoscopy Placement Confirmation: ETT inserted through vocal cords under direct vision, CO2 detector and Breath sounds checked- equal and bilateral Dental Injury: Teeth and Oropharynx as per pre-operative assessment     .Critical Care Performed by: Merlyn Lot, MD Authorized by: Merlyn Lot, MD   Critical care provider statement:    Critical care time (minutes):  40   Critical care was necessary to treat or prevent imminent or life-threatening deterioration of the following conditions:  CNS failure or compromise   Critical care was time spent personally by me on the following activities:  Ordering and performing treatments and interventions, ordering and review of laboratory studies, ordering and review of radiographic studies, pulse oximetry, re-evaluation of patient's condition, review of old charts, obtaining history  from patient or surrogate, examination of patient, evaluation of patient's response to treatment, discussions with primary provider, discussions with consultants and development of treatment plan with patient or surrogate   MEDICATIONS ORDERED IN ED: Medications  lactated ringers infusion ( Intravenous New Bag/Given 10/26/2021 1139)  vancomycin (VANCOCIN) IVPB 1000 mg/200 mL premix (1,000 mg Intravenous New Bag/Given 11/14/2021 1147)  lactated ringers infusion ( Intravenous New Bag/Given 11/12/2021 1159)  propofol (DIPRIVAN) 1000 MG/100ML infusion (has no administration in time range)  levETIRAcetam (KEPPRA) 3,000 mg in sodium chloride 0.9 % 250 mL IVPB (has no administration in time range)  ceFEPIme (MAXIPIME) 2  g in sodium chloride 0.9 % 100 mL IVPB (0 g Intravenous Stopped 11/10/2021 1216)  metroNIDAZOLE (FLAGYL) IVPB 500 mg (0 mg Intravenous Stopped 11/13/2021 1223)  iohexol (OMNIPAQUE) 350 MG/ML injection 100 mL (100 mLs Intravenous Contrast Given 10/24/2021 1116)  lactated ringers bolus 1,000 mL (0 mLs Intravenous Stopped 11/04/2021 1110)    And  lactated ringers bolus 1,000 mL (0 mLs Intravenous Stopped 11/05/2021 1218)    And  lactated ringers bolus 1,000 mL (0 mLs Intravenous Stopped 10/25/2021 1242)  LORazepam (ATIVAN) injection 2 mg (2 mg Intravenous Given 11/04/2021 1203)  levETIRAcetam (KEPPRA) IVPB 1000 mg/100 mL premix (0 mg Intravenous Stopped 11/13/2021 1236)     IMPRESSION / MDM / ASSESSMENT AND PLAN / ED COURSE  I reviewed the triage vital signs and the nursing notes.                              Differential diagnosis includes, but is not limited to, Dehydration, sepsis, pna, uti, hypoglycemia, cva, drug effect, withdrawal, encephalitis  Patient presents unresponsive with GCS of 4 no report of trauma.  Reported seizure activity.  Blood work neuroimaging and radiographs to be ordered for the blood differential.  As I am concerned the patient is not protecting his airway we immediately set the patient  up for intubation.   Clinical Course as of 11/15/2021 1243  Sat Nov 16, 2021  0956 Patient intubated without complication.  Taken emergently to CT scanner given concern for acute intracranial abnormality. [PR]  [id number] CT imaging of brain by my review without bleed.  Will await formal radiology review. [PR]  [id number] Tempted to reach spouse no answer. [PR]  [id number] Patient's lactate is significantly elevated patient will be covered with broad-spectrum antibiotics as infectious encephalopathy is on the differential although his INR is elevated would not be a candidate for LP at this time patient has been seen by neurology at bedside has recommended CTA head and neck for which the patient is going right now. [PR]  1124 Urinalysis without significant infection. [PR]  [id number] My review of CT chest abdomen pelvis I do suspect consolidation left lower lobe lung no sign of large vessel occlusion or dissection. Will await formal radiology review. [PR]  1211 Dr. Leonel Ramsay neurology has been evaluated patient at bedside and arranged emergent EEG which is showing that the patient is having seizures.  Has been given IV Keppra.  Will be placed on propofol infusion the patient remains unresponsive without purposeful movement.  Is receiving broad-spectrum antibiotics for possible sepsis.  As he is having seizures we do not have continuous EEG capabilities will require transfer to [facility] ICU. [PR]  1231 Discussed the case in consultation with Dr. Halford Chessman of ICU at [facility] was, excepted patient to their facility. [PR]    Clinical Course User Index [PR] Merlyn Lot, MD     FINAL CLINICAL IMPRESSION(S) / ED DIAGNOSES   Final diagnoses:  Unresponsive  Sepsis with encephalopathy and septic shock, due to unspecified organism [facility])  Status epilepticus ([place])     Rx / DC Orders   ED Discharge Orders     None        Note:  This document was prepared using Dragon voice recognition software and may include  unintentional dictation errors.    Merlyn Lot, MD 11/01/2021 1243

## 2021-11-16 NOTE — Progress Notes (Signed)
Pt with frequent seizures on EEG,  loading with 4g IV keppra and propofol.   I am not sure if this is the primary driver of his mental status changes or if he has some other process driving MS changes and seizures. I will add acyclovir, and would favor broad CNS coverage pending LP(on eliquis, so will need to wait 48 hours).   He will need continuous EEG monitoring and I have recommended transfer to [facility].   Ritta Slot, MD [facility] [phone]  If 7pm- 7am, please page neurology on call as listed in AMION.

## 2021-11-16 NOTE — H&P (Signed)
NAME:  Harry [name], MRN:  [id number], DOB:  01-Aug-1941, LOS: 0 ADMISSION DATE:  (Not on file), CONSULTATION DATE:  11/12/2021 REFERRING MD:  Dr. Georgie Chard, [facility], CHIEF COMPLAINT:  Altered mental status   History of Present Illness:  81 yo male brought to [facility] ER after being found by his wife unresponsive with his head lying off the bed.  He was making gurgling noises.  CBG 80, given D10, no improvement.  Intubated in ER for airway protection.  Neuro consulted.  CT head negative.  EEG showed seizure.  Unable to do LP initial since on DOAC.  Started on keppra, ABx, and acyclovir.  Transferred to [facility] for LTM.  Pertinent  Medical History  Atrial Fib (on DOAC), CAD, AS, s/p 3V CABG x3 & AVR (2023), COPD, HTN, HL, B12 def, BPH, DDD, Diabetes (on insulin), diabetic retinopathy, Obesity, OSA, chronic stroke  Significant Hospital Events: Including procedures, antibiotic start and stop dates in addition to other pertinent events   2/25 admitted after being found unresponsive by wife. No nuchal rigidity Intubated for airway protection. Initial CT of head/ CT angiogram: both w/ no acute findings. EEG showed active seizure. Lactic acid 7.2. wbc 14.4,. Cultures sent. LP not able to be done due to home DOAC. Transferred to [facility] for LTM w/ status epilepticus concern for possible CNS infection: stated on: acyclovir, cefepime and vanc .   Interim History / Subjective:  Just arrived from [facility].  Objective   There were no vitals taken for this visit.    Vent Mode: AC FiO2 (%):  [35 %-50 %] 35 % Set Rate:  [16 bmp] 16 bmp Vt Set:  [450 mL] 450 mL PEEP:  [5 cmH20] 5 cmH20  No intake or output data in the 24 hours ending 11/02/2021 1322 There were no vitals filed for this visit.  Examination:  General - sedated Eyes - pupils pinpoint ENT - ETT in place Cardiac - irregular Chest - equal breath sounds b/l, no wheezing or rales Abdomen - soft, non tender, + bowel sounds Extremities - no cyanosis, clubbing, or  edema Skin - no rashes Neuro - RASS -3  Resolved Hospital Problem list     Assessment & Plan:   Comatose state with status epilepticus with concern for infection process. - neurology consulted - vancomycin, rocephin, ampicillin, acyclovir for infection coverage - AEDs, LTM per neurology - diprivan for RASS goal -3 to -4 - will need MRI brain when stable - defer LP for now since he is on DOAC as outpt  Compromised airway. Hx of COPD. - full vent support - goal SpO2 > 92% - f/u CXR, ABG - prn duoneb  Hx of CAD s/p 3v CABG with AVR on 09/24/21 at [facility], A fib, HTN, HLD, chronic systolic CHF. - hold ASA, eliquis for now - continue pravachol - repeat Echo  DM type 2. - SSI  Lactic acidosis. - likely from seizures - f/u lactic acid  Best Practice (right click and "Reselect all SmartList Selections" daily)   Diet/type: tubefeeds DVT prophylaxis: SCD GI prophylaxis: PPI Lines: N/A Foley:  N/A Code Status:  full code Last date of multidisciplinary goals of care discussion [x]   Labs   CBC: Recent Labs  Lab 10/28/2021 1006  WBC 14.4*  NEUTROABS 7.1  HGB 12.3*  HCT 42.6  MCV 84.9  PLT 175    Basic Metabolic Panel: Recent Labs  Lab 10/30/2021 1006  NA 136  K 4.4  CL 104  CO2 13*  GLUCOSE 175*  BUN 33*  CREATININE 1.60*  CALCIUM 8.9   GFR: Estimated Creatinine Clearance: 39.2 mL/min (A) (by C-G formula based on SCr of 1.6 mg/dL (H)). Recent Labs  Lab 11/15/2021 1006 11/17/2021 1206  WBC 14.4*  --   LATICACIDVEN 7.2* 3.7*    Liver Function Tests: Recent Labs  Lab 10/31/2021 1006  AST 35  ALT 19  ALKPHOS 94  BILITOT 0.8  PROT 7.6  ALBUMIN 3.4*   No results for input(s): LIPASE, AMYLASE in the last 168 hours. Recent Labs  Lab 11/13/2021 1152  AMMONIA 10    ABG    Component Value Date/Time   PHART 7.39 11/11/2021 1100   PCO2ART 22 (L) 11/17/2021 1100   PO2ART 143 (H) 11/14/2021 1100   HCO3 13.3 (L) 10/29/2021 1100   TCO2 19 (L) 04/01/2021  1517   ACIDBASEDEF 9.5 (H) 11/12/2021 1100   O2SAT 99.9 11/19/2021 1100     Coagulation Profile: Recent Labs  Lab 11/06/2021 1006  INR 1.7*    Cardiac Enzymes: No results for input(s): CKTOTAL, CKMB, CKMBINDEX, TROPONINI in the last 168 hours.  HbA1C: Hgb A1c MFr Bld  Date/Time Value Ref Range Status  09/15/2021 03:31 PM 6.5 (H) 4.8 - 5.6 % Final    Comment:    (NOTE)         Prediabetes: 5.7 - 6.4         Diabetes: >6.4         Glycemic control for adults with diabetes: <7.0   09/14/2021 06:57 PM 6.5 (H) 4.8 - 5.6 % Final    Comment:    (NOTE)         Prediabetes: 5.7 - 6.4         Diabetes: >6.4         Glycemic control for adults with diabetes: <7.0     CBG: Recent Labs  Lab 11/19/2021 0948  GLUCAP 165*    Review of Systems:   Unable to obtain  Past Medical History:  He,  has a past medical history of Aortic stenosis, CAD (coronary artery disease), Diabetes (HCC), HTN (hypertension), and PAF (paroxysmal atrial fibrillation) (HCC).   Surgical History:   Past Surgical History:  Procedure Laterality Date   LEFT HEART CATH AND CORONARY ANGIOGRAPHY N/A 09/17/2021   Procedure: LEFT HEART CATH AND CORONARY ANGIOGRAPHY and possible PCI and stent;  Surgeon: Marcina Millard, MD;  Location: [facility] INVASIVE CV LAB;  Service: Cardiovascular;  Laterality: N/A;   PACEMAKER LEADLESS INSERTION N/A 02/20/2021   Procedure: PACEMAKER LEADLESS INSERTION;  Surgeon: Marcina Millard, MD;  Location: [facility] INVASIVE CV LAB;  Service: Cardiovascular;  Laterality: N/A;     Social History:   reports that he has never smoked. He has never used smokeless tobacco. He reports that he does not drink alcohol and does not use drugs.   Family History:  His family history is not on file.   Allergies Allergies  Allergen Reactions   Atorvastatin     Other reaction(s): Muscle Pain   Bupropion Other (See Comments)    Other reaction(s): Dizziness CNS side effects   Citalopram      Other reaction(s): Other (See Comments) GI side effects   Pioglitazone     Other reaction(s): Muscle Pain     Home Medications  Prior to Admission medications   Medication Sig Start Date End Date Taking? Authorizing Provider  apixaban (ELIQUIS) 5 MG TABS tablet Take 1 tablet (5 mg total) by mouth 2 (two) times daily.  02/22/21   Delfino Lovett, MD  ASPIRIN 81 PO Take 81 mg by mouth daily. 06/28/10   [provider]  diltiazem (CARDIZEM) 30 MG tablet Take 1 tablet (30 mg total) by mouth every 6 (six) hours. Patient not taking: Reported on 11/08/2021 09/17/21   Tresa Moore, MD  insulin NPH-regular Human (70-30) 100 UNIT/ML injection Inject 40-60 Units into the skin See admin instructions. Inject 40 units in the morning and 60 units in the evening. Patient not taking: Reported on 10/23/2021    [provider]  metFORMIN (GLUCOPHAGE) 1000 MG tablet Take 1,000 mg by mouth 2 (two) times daily with a meal. 04/20/20   [provider]  metoprolol tartrate (LOPRESSOR) 25 MG tablet Take 1 tablet (25 mg total) by mouth 2 (two) times daily. Patient not taking: Reported on 11/18/2021 09/17/21   Tresa Moore, MD  Multiple Vitamins-Minerals (PRESERVISION/LUTEIN PO) Take 1 capsule by mouth daily.    [provider]  nitroGLYCERIN (NITROSTAT) 0.4 MG SL tablet Place 0.4 mg under the tongue every 5 (five) minutes as needed for chest pain. 11/27/17   [provider]  Omega-3 1000 MG CAPS Take 1,000 mg by mouth every evening. 12/27/10   [provider]  pravastatin (PRAVACHOL) 20 MG tablet Take 20 mg by mouth daily. 06/29/20   [provider]  TRULICITY 1.5 MG/0.5ML SOPN Inject 1.5 mg into the skin See admin instructions. Every 10 days Patient not taking: Reported on 10/23/2021 10/01/20   [provider]  vitamin B-12 (CYANOCOBALAMIN) 50 MCG tablet Take 1 tablet by mouth daily. 08/08/21   [provider]  diltiazem (CARDIZEM CD) 240 MG  24 hr capsule Take 240 mg by mouth daily. Patient not taking: No sig reported 12/24/20 04/02/21  [provider]  lisinopril (ZESTRIL) 5 MG tablet Take 1 tablet by mouth daily. Patient not taking: No sig reported 06/29/20 04/02/21  [provider]     Critical care time: 45 minutes  Coralyn Helling, MD [facility] Pulmonary/Critical Care Pager - [phone] 11/07/2021, 3:53 PM

## 2021-11-16 NOTE — ED Notes (Signed)
Pt intubated with 7.5 tube, positive color change noted  Tube at 22 at the lip

## 2021-11-16 NOTE — Consult Note (Signed)
CODE SEPSIS - PHARMACY COMMUNICATION  **Broad Spectrum Antibiotics should be administered within 1 hour of Sepsis diagnosis**  Time Code Sepsis Called/Page Received: 1041  Antibiotics Ordered: 1045  Time of 1st antibiotic administration: 1145  Additional action taken by pharmacy: Notified RN to assist in triage of order in case of stock out. Pt noted at the time to be OTF to CT scanner, med was administered upon return.  If necessary, Name of Provider/Nurse Contacted: Ella Bodo RN  Martyn Malay ,PharmD Clinical Pharmacist  11/05/2021  10:53 AM

## 2021-11-16 NOTE — Progress Notes (Signed)
LTM EEG hooked up and running - no initial skin breakdown - push button tested - neuro notified. [facility] monitoring.  

## 2021-11-16 NOTE — ED Notes (Signed)
Harry [name] (daughter at [facility]) attempted to sign transfer consent, form and etool unavailable. Verbal consent given.

## 2021-11-16 NOTE — ED Notes (Addendum)
Pt in CT, no changes, breathing with vent, calm. VSS. IVF infusing.

## 2021-11-16 NOTE — Consult Note (Addendum)
Neurology Consultation Reason for Consult: unresponsiveness Referring Physician: Nigel Bridgeman  CC: Unresponsiveness  History is obtained from:patient's wife  HPI: Harry [name] is a 81 y.o. male body who was mostly in his normal state of health  This morning, his wife came into the room and found him on the bed with his body on the bed, and his head sticking off of it.  His eyes were open, but he would not fixate or engage her.  He was making incomprehensible sounds, nothing that sounded like a word or an answer to her questions.  On EMS arrival, their glucose was 80s and he was given D10.  He had no improvement and was a GCS of three on arrival to the emergency department and therefore was intubated.   ROS: Unable to obtain due to altered mental status.   Past Medical History:  Diagnosis Date   Aortic stenosis    CAD (coronary artery disease)    Diabetes (HCC)    HTN (hypertension)    PAF (paroxysmal atrial fibrillation) (HCC)      No family history on file.   Social History:  reports that he has never smoked. He has never used smokeless tobacco. He reports that he does not drink alcohol and does not use drugs.   Exam: Current vital signs: BP 126/65    Pulse 85    Resp 19    Wt 86 kg    SpO2 100%    BMI 29.69 kg/m  Vital signs in last 24 hours: Pulse Rate:  [85] 85 (02/25 0948) Resp:  [19] 19 (02/25 0948) BP: (126)/(65) 126/65 (02/25 0948) SpO2:  [97 %-100 %] 100 % (02/25 1000) FiO2 (%):  [50 %] 50 % (02/25 1000) Weight:  [86 kg] 86 kg (02/25 0956)   Physical Exam  In bed with no obvious injuries, intubated and on the ventilator.  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     I have reviewed labs in epic and the results pertinent to this consultation are: Cr 1.6, stable from 2/22   I have reviewed the images obtained:CTA - no LVO CT - negative  Impression: 81 yo M with acute onset coma of unclear etiology. No evidence of basilar occlusion. No meningismus to suggest meningitis, INR too high for LP. Septic encephalopathy is possible. Status epilepticus would be a consideration as well. No evidence of hypoglycemia and per wife, he no longer takes insulin, just metformin. One consideration would be prolonged hypotension from afib/RVR that spontaneously converted. Embolic shower without LVO would be another consideration.  Recommendations: 1) STAT EEG 2) ABG, ammonia, TSH 3) Agree with sepsis coverage.  4) MRI brain once stable 5) Will follow.   This patient is critically ill and at significant risk of neurological worsening, death and care requires constant monitoring of vital signs, hemodynamics,respiratory and cardiac monitoring, neurological assessment, discussion with family, other specialists and medical decision making of high complexity. I spent 50 minutes of neurocritical care time  in the care of  this patient. This was time spent independent of any time provided by nurse practitioner or PA.  Roland Rack, MD [facility] [phone]  If 7pm- 7am, please page  neurology on call as listed in [place]. 11/04/2021  11:53 AM

## 2021-11-16 NOTE — Progress Notes (Signed)
Eeg done 

## 2021-11-16 NOTE — ED Notes (Signed)
2nd BC obtained with 3rd IV access. Delay in sepsis protocol d/t CTs

## 2021-11-16 NOTE — ED Notes (Signed)
EEG in progress. IVF and ABT initiated, neuro at [facility].

## 2021-11-16 NOTE — ED Notes (Signed)
Neuro at [facility], ativan ordered for sz activity noted on EEG.

## 2021-11-16 NOTE — Procedures (Signed)
History: 81 yo M with coma of unclear eitology.   Sedation: None  Technique: This EEG was acquired with electrodes placed according to the International 10-20 electrode system (including Fp1, Fp2, F3, F4, C3, C4, P3, P4, O1, O2, T3, T4, T5, T6, A1, A2, Fz, Cz, Pz). The following electrodes were missing or displaced: none.   Background: The background consists of irregular delta and theta activity.  There are frequent periods of evolution starting with high beta low gamma range activity in the left frontal region which evolves in frequency, field and morphology.  The seizures occur frequently lasting from 30 seconds to a minute.  Followed by 1 to 2 minutes of quiescence.  Photic stimulation: Physiologic driving is not performed  EEG Abnormalities: 1) frequent partial seizures arising in the left frontal region, amounting to status epilepticus  Clinical Interpretation: This EEG recorded frequent seizures in the left frontal region, essentially amounting to partial status epilepticus.   Ritta Slot, MD [facility] [phone]  If 7pm- 7am, please page neurology on call as listed in [facility].

## 2021-11-16 NOTE — Progress Notes (Signed)
Pharmacy Antibiotic Note  Harry [name] is a 81 y.o. male admitted on 11/27/2021 with possible meningitis.  Pharmacy has been consulted for ampicillin, acyclovir and vancomycin dosing. Also on ceftriaxone per team.   ClCr 39 ml/min. Has 3 PIVs. Received 1g x1 at 11am. Vancomycin trough dosing (goal 15-20).  Plan: Ampicillin 2g Q6 hr Vancomycin 1250mg  Q24hr  Acyclovir 10mg /kg q12 hr  Ceftriaxone 2g q12 hr per team  Monitor cultures, clinical status, renal function, vancomycin level Narrow abx as able and f/u duration    Height: 5\' 7"  (170.2 cm) IBW/kg (Calculated) : 66.1  Temp (24hrs), Avg:97.1 F (36.2 C), Min:95.1 F (35.1 C), Max:98 F (36.7 C)  Recent Labs  Lab 12/07/2021 1006 12/12/2021 1206  WBC 14.4*  --   CREATININE 1.60*  --   LATICACIDVEN 7.2* 3.7*    Estimated Creatinine Clearance: 39.2 mL/min (A) (by C-G formula based on SCr of 1.6 mg/dL (H)).    Allergies  Allergen Reactions   Atorvastatin     Other reaction(s): Muscle Pain   Bupropion Other (See Comments)    Other reaction(s): Dizziness CNS side effects   Citalopram     Other reaction(s): Other (See Comments) GI side effects   Pioglitazone     Other reaction(s): Muscle Pain    Antimicrobials this admission: Cefe 2/25 MTZ 2/25 Amp 2/25 >> Vanc 2/25 >> Acyclovir 2/25 >>  CTX 2/25 >>   Dose adjustments this admission: N/a  Microbiology results: 2/25 BCx: pend   Thank you for allowing pharmacy to be a part of this patients care.  3/25, PharmD, BCPS, BCCP Clinical Pharmacist  Please check AMION for all [facility] Pharmacy phone numbers After 10:00 PM, call Main Pharmacy [phone]

## 2021-11-16 NOTE — ED Notes (Signed)
EDP at [facility], neurology at [facility], lactate 7.2 s/p 2 sz, pt to CT STAT. No changes. Delay in ABTd/t CT.

## 2021-11-16 NOTE — ED Notes (Signed)
No changes. Family at [facility]. RT at [facility]. [facility] at [facility], VSS.

## 2021-11-16 NOTE — Progress Notes (Signed)
[facility]link following code sepsis °

## 2021-11-17 ENCOUNTER — Inpatient Hospital Stay (HOSPITAL_COMMUNITY): Payer: Medicare HMO

## 2021-11-17 DIAGNOSIS — I5022 Chronic systolic (congestive) heart failure: Secondary | ICD-10-CM

## 2021-11-17 DIAGNOSIS — G40901 Epilepsy, unspecified, not intractable, with status epilepticus: Secondary | ICD-10-CM | POA: Diagnosis not present

## 2021-11-17 DIAGNOSIS — J988 Other specified respiratory disorders: Secondary | ICD-10-CM | POA: Diagnosis not present

## 2021-11-17 DIAGNOSIS — R402431 Glasgow coma scale score 3-8, in the field [EMT or ambulance]: Secondary | ICD-10-CM | POA: Diagnosis not present

## 2021-11-17 DIAGNOSIS — R569 Unspecified convulsions: Secondary | ICD-10-CM | POA: Diagnosis not present

## 2021-11-17 LAB — ECHOCARDIOGRAM COMPLETE
AR max vel: 2.12 cm2
AV Area VTI: 2.08 cm2
AV Area mean vel: 2.23 cm2
AV Mean grad: 4.7 mmHg
AV Peak grad: 9.9 mmHg
Ao pk vel: 1.57 m/s
Area-P 1/2: 5.13 cm2
Height: 67 in
MV VTI: 1.74 cm2
S' Lateral: 3.8 cm
Weight: 2962.98 oz

## 2021-11-17 LAB — COMPREHENSIVE METABOLIC PANEL
ALT: 16 U/L (ref 0–44)
AST: 22 U/L (ref 15–41)
Albumin: 2.4 g/dL — ABNORMAL LOW (ref 3.5–5.0)
Alkaline Phosphatase: 72 U/L (ref 38–126)
Anion gap: 9 (ref 5–15)
BUN: 23 mg/dL (ref 8–23)
CO2: 16 mmol/L — ABNORMAL LOW (ref 22–32)
Calcium: 7.8 mg/dL — ABNORMAL LOW (ref 8.9–10.3)
Chloride: 111 mmol/L (ref 98–111)
Creatinine, Ser: 1.17 mg/dL (ref 0.61–1.24)
GFR, Estimated: >60 mL/min (ref >60)
Glucose, Bld: 138 mg/dL — ABNORMAL HIGH (ref 70–99)
Potassium: 3.9 mmol/L (ref 3.5–5.1)
Sodium: 136 mmol/L (ref 135–145)
Total Bilirubin: 0.5 mg/dL (ref 0.3–1.2)
Total Protein: 5.5 g/dL — ABNORMAL LOW (ref 6.5–8.1)

## 2021-11-17 LAB — CBC
HCT: 34 % — ABNORMAL LOW (ref 39.0–52.0)
Hemoglobin: 10.2 g/dL — ABNORMAL LOW (ref 13.0–17.0)
MCH: 24.6 pg — ABNORMAL LOW (ref 26.0–34.0)
MCHC: 30 g/dL (ref 30.0–36.0)
MCV: 82.1 fL (ref 80.0–100.0)
Platelets: 147 10*3/uL — ABNORMAL LOW (ref 150–400)
RBC: 4.14 MIL/uL — ABNORMAL LOW (ref 4.22–5.81)
RDW: 21.3 % — ABNORMAL HIGH (ref 11.5–15.5)
WBC: 13.4 10*3/uL — ABNORMAL HIGH (ref 4.0–10.5)
nRBC: 0 % (ref 0.0–0.2)

## 2021-11-17 LAB — POCT I-STAT 7, (LYTES, BLD GAS, ICA,H+H)
Acid-base deficit: 6 mmol/L — ABNORMAL HIGH (ref 0.0–2.0)
Bicarbonate: 18.3 mmol/L — ABNORMAL LOW (ref 20.0–28.0)
Calcium, Ion: 1.21 mmol/L (ref 1.15–1.40)
HCT: 31 % — ABNORMAL LOW (ref 39.0–52.0)
Hemoglobin: 10.5 g/dL — ABNORMAL LOW (ref 13.0–17.0)
O2 Saturation: 99 %
Patient temperature: 98.6
Potassium: 3.7 mmol/L (ref 3.5–5.1)
Sodium: 139 mmol/L (ref 135–145)
TCO2: 19 mmol/L — ABNORMAL LOW (ref 22–32)
pCO2 arterial: 32.1 mmHg (ref 32–48)
pH, Arterial: 7.365 (ref 7.35–7.45)
pO2, Arterial: 164 mmHg — ABNORMAL HIGH (ref 83–108)

## 2021-11-17 LAB — GLUCOSE, CAPILLARY
Glucose-Capillary: 141 mg/dL — ABNORMAL HIGH (ref 70–99)
Glucose-Capillary: 144 mg/dL — ABNORMAL HIGH (ref 70–99)
Glucose-Capillary: 151 mg/dL — ABNORMAL HIGH (ref 70–99)
Glucose-Capillary: 154 mg/dL — ABNORMAL HIGH (ref 70–99)
Glucose-Capillary: 166 mg/dL — ABNORMAL HIGH (ref 70–99)
Glucose-Capillary: 98 mg/dL (ref 70–99)

## 2021-11-17 LAB — TRIGLYCERIDES: Triglycerides: 146 mg/dL (ref <150)

## 2021-11-17 LAB — LACTIC ACID, PLASMA: Lactic Acid, Venous: 1.5 mmol/L (ref 0.5–1.9)

## 2021-11-17 IMAGING — DX DG CHEST 1V PORT
1 series · 1 of 1 positions shown · non-contrast
Comparison: [DATE] and older studies.

CLINICAL DATA: Respirator dependent Evaluate atelectasis. pneumonia
Pulmonary edema. CABG x3 [DATE]

EXAM:
PORTABLE CHEST 1 VIEW

[chest]
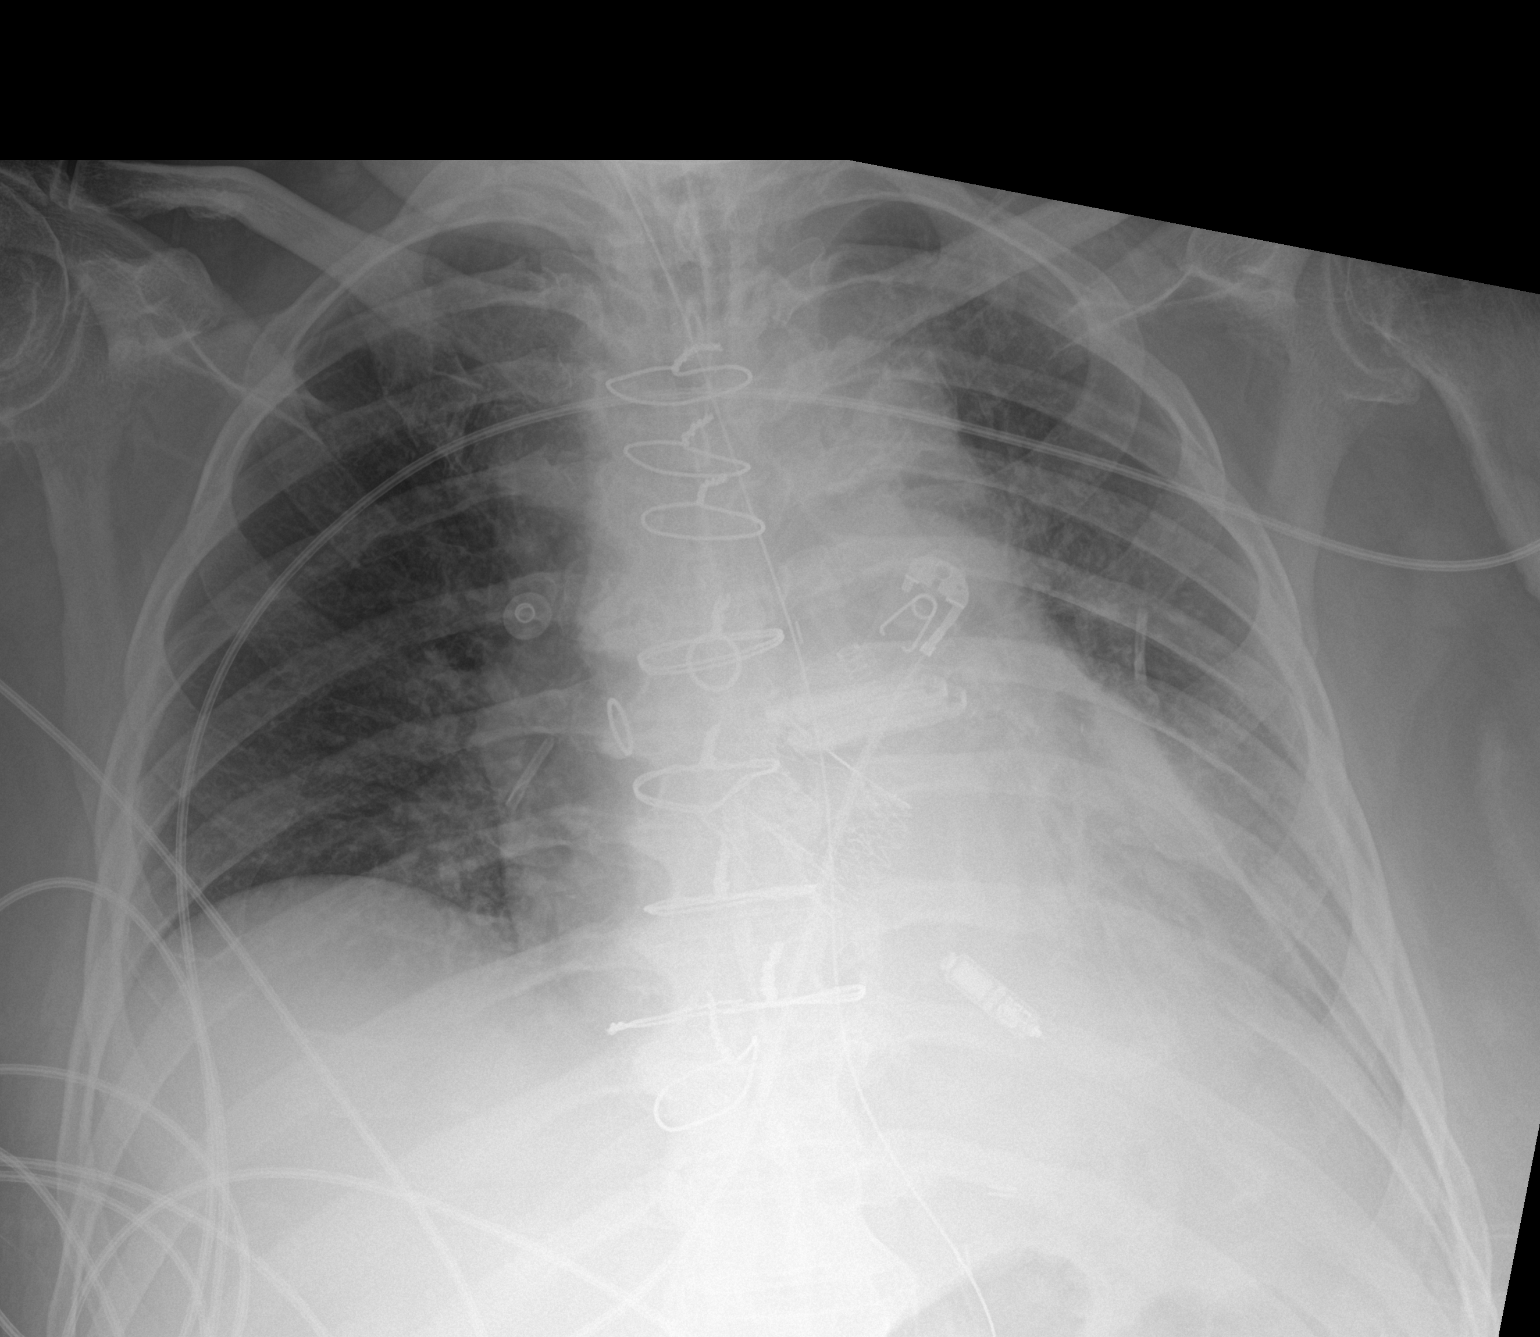

[1 of 1 positions shown; findings below may reference images not displayed]

FINDINGS: Endotracheal tube and nasal/orogastric tube are stable.

There is opacity at the left lung base that silhouettes the
hemidiaphragm, consistent with atelectasis with a probable small
associated pleural effusion. Remainder of the lungs is clear.

No mediastinal widening.  No pneumothorax.
IMPRESSION: 1. No significant change from the previous day's study allowing for
differences in patient positioning and lung volumes.
2. No evidence of an operative complication.
3. Persistent left lung base opacity consistent with a combination
of a small effusion and atelectasis.
4. Stable well-positioned support apparatus.

## 2021-11-17 MED ORDER — VANCOMYCIN HCL 750 MG/150ML IV SOLN
750.0000 mg | Freq: Two times a day (BID) | INTRAVENOUS | Status: DC
  Administered 2021-11-18: 750 mg via INTRAVENOUS
  Filled 2021-11-17 (×2): qty 150

## 2021-11-17 MED ORDER — PERFLUTREN LIPID MICROSPHERE
1.0000 mL | INTRAVENOUS | Status: AC | PRN
  Administered 2021-11-17: 2 mL via INTRAVENOUS
  Filled 2021-11-17: qty 10

## 2021-11-17 MED ORDER — DEXTROSE 5 % IV SOLN
860.0000 mg | Freq: Three times a day (TID) | INTRAVENOUS | Status: DC
  Administered 2021-11-17 – 2021-11-21 (×12): 860 mg via INTRAVENOUS
  Filled 2021-11-17 (×13): qty 17.2

## 2021-11-17 MED ORDER — SODIUM CHLORIDE 0.9 % IV SOLN: INTRAVENOUS | Status: DC

## 2021-11-17 NOTE — Procedures (Addendum)
Patient Name: Harry [name]  MRN: [id number]  Epilepsy Attending: Charlsie Quest  Referring Physician/Provider: Rejeana Brock, MD Duration: 11/19/2021 1604 to 11/17/2021 1604  Patient history: 81yo M with ams, eeg showed status epilepticus. Long term eeg for monitoring of seizuerees  Level of alertness: comatose  AEDs during EEG study: LEV, propofol  Technical aspects: This EEG study was done with scalp electrodes positioned according to the 10-20 International system of electrode placement. Electrical activity was acquired at a sampling rate of 500Hz  and reviewed with a high frequency filter of 70Hz  and a low frequency filter of 1Hz . EEG data were recorded continuously and digitally stored.   Description: EEG showed near continuous generalized polymorphic 3 to 6 Hz theta-delta slowing admixed with 15-18hz  generalized beta activity. There are also brief 2-5 seconds of generalized eeg attenuation.  Hyperventilation and photic stimulation were not performed.     ABNORMALITY - Continuous slow, generalized - Background attenuation, generalized  IMPRESSION: This study is suggestive of severe  to profound diffuse encephalopathy, nonspecific etiology but could be related to sedation. No seizures or definite epileptiform discharges were seen throughout the recording.  [name] 

## 2021-11-17 NOTE — Progress Notes (Addendum)
Pharmacy Antibiotic Note  Harry [name] is a 81 y.o. male admitted on 11/03/2021 with possible meningitis.  Pharmacy has been consulted for  acyclovir and vancomycin dosing.    Scr improved with fluids. ClCr 53 ml/min.  Vancomycin  1250mg  given at 7:45am; target trough dosing (goal 15-20). Will increase vancomycin dose. Pending LP. Restart maintenance fluid to prevent AKI on acyclovir per protocol.   Plan: Increase Vancomycin to 750mg  q12 hr  Increase  Acyclovir 10mg /kg q8 hr  Restart maintenance NS at 75 ml/ hr Monitor cultures, clinical status, renal function, vancomycin level Narrow abx as able and f/u duration    Height: 5\' 7"  (170.2 cm) Weight: 84 kg (185 lb 3 oz) IBW/kg (Calculated) : 66.1  Temp (24hrs), Avg:97.1 F (36.2 C), Min:95.1 F (35.1 C), Max:98.2 F (36.8 C)  Recent Labs  Lab 11/19/2021 1006 11/15/2021 1206 11/17/21 0146 11/17/21 0158  WBC 14.4*  --  13.4*  --   CREATININE 1.60*  --  1.17  --   LATICACIDVEN 7.2* 3.7*  --  1.5     Estimated Creatinine Clearance: 53.1 mL/min (by C-G formula based on SCr of 1.17 mg/dL).    Allergies  Allergen Reactions   Atorvastatin     Other reaction(s): Muscle Pain   Bupropion Other (See Comments)    Other reaction(s): Dizziness CNS side effects   Citalopram     Other reaction(s): Other (See Comments) GI side effects   Pioglitazone     Other reaction(s): Muscle Pain    Antimicrobials this admission: Cefe 2/25 MTZ 2/25  Vanc 2/25 >> Acyclovir 2/25 >>    Dose adjustments this admission: 2/26 renal function improved; vancomycin 1250mg  x24hr > 750mg  q12 hr, acyclovir to 10mg /kg q 8 hr   Microbiology results: 2/25 BCx: ngtd   Thank you for allowing pharmacy to be a part of this patients care.  3/25, PharmD, BCPS, BCCP Clinical Pharmacist  Please check AMION for all [facility] Pharmacy phone numbers After 10:00 PM, call Main Pharmacy [phone]

## 2021-11-17 NOTE — H&P (Signed)
NAME:  Harry [name], MRN:  [id number], DOB:  March 20, 1941, LOS: 1 ADMISSION DATE:  10/31/2021, CONSULTATION DATE:  11/13/2021 REFERRING MD:  Dr. Jerrol Banana, [facility], CHIEF COMPLAINT:  Altered mental status   History of Present Illness:  81 yo male brought to [facility] ER after being found by his wife unresponsive with his head lying off the bed.  He was making gurgling noises.  CBG 80, given D10, no improvement.  Intubated in ER for airway protection.  Neuro consulted.  CT head negative.  EEG showed seizure.  Unable to do LP initial since on DOAC.  Started on keppra, ABx, and acyclovir.  Transferred to [facility] for LTM.  Pertinent  Medical History  Atrial Fib (on DOAC), CAD, AS, s/p 3V CABG x3 & AVR (2023), COPD, HTN, HL, B12 def, BPH, DDD, Diabetes (on insulin), diabetic retinopathy, Obesity, OSA, chronic stroke  Significant Hospital Events: Including procedures, antibiotic start and stop dates in addition to other pertinent events   2/25 admitted after being found unresponsive by wife. No nuchal rigidity Intubated for airway protection. Initial CT of head/ CT angiogram: both w/ no acute findings. EEG showed active seizure. Lactic acid 7.2. wbc 14.4,. Cultures sent. LP not able to be done due to home DOAC. Transferred to [facility] for LTM w/ status epilepticus concern for possible CNS infection: stated on: acyclovir, cefepime and vanc .   Interim History / Subjective:  Received in transfer from [facility] for altered mental status found to be in partial complex status.  Loaded with Keppra and started on propofol. No clinical seizures overnight.  Objective   Blood pressure 108/80, pulse 88, temperature 99 F (37.2 C), resp. rate 20, height 5\' 7"  (1.702 m), weight 84 kg, SpO2 97 %.    Vent Mode: PRVC FiO2 (%):  [30 %-100 %] 30 % Set Rate:  [16 bmp] 16 bmp Vt Set:  [520 mL-540 mL] 540 mL PEEP:  [5 cmH20] 5 cmH20 Plateau Pressure:  [14 cmH20-16 cmH20] 15 cmH20   Intake/Output Summary (Last 24 hours) at 11/17/2021  1401 Last data filed at 11/17/2021 1200 Gross per 24 hour  Intake 2211.97 ml  Output 1395 ml  Net 816.97 ml   Filed Weights   11/17/21 0429  Weight: 84 kg    Examination:  General - sedated, elderly Eyes - pupils pinpoint ENT - ETT in place Cardiac - irregular Chest - equal breath sounds b/l, no wheezing or rales Abdomen - soft, non tender, + bowel sounds Extremities - no cyanosis, clubbing, or edema Skin - no rashes Neuro - RASS - 5  Continuous EEG personally reviewed, shows profound burst suppression.  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 Compromised airway. Hx of COPD. Hx of CAD s/p 3v CABG with AVR on 09/24/21 at [facility], A fib, HTN, HLD, chronic systolic CHF. DM type 2. Lactic acidosis.  Plan:  -Patient appears to have had adequate seizure control at this point with a very suppressed EEG this time.  Commence gradual weaning of propofol.  Watch for seizure recurrence and consider additional anticonvulsant agents if seizures recur. -Once adequate control of seizures achieved and patient off long-term EEG monitoring we will proceed to diagnostic MRI. -Suspect patient has had micro vascular changes possibly related to recent valve surgery. -Suspicion for infectious source is low. -We will hold on LP at this time and reconsider if patient remains altered even after sedation discontinued. -Follow-up  echocardiogram today.  Best Practice (right click and "Reselect all SmartList Selections" daily)   Diet/type: tubefeeds DVT prophylaxis: SCD GI prophylaxis: PPI Lines: N/A Foley:  N/A Code Status:  full code Last date of multidisciplinary goals of care discussion [Long discussion with family regarding patient's current state, possible etiology and nature of status epilepticus and its treatment.]  CRITICAL CARE Performed by: Kipp Brood   Total critical care time: 43  minutes  Critical care time was exclusive of separately billable procedures and treating other patients.  Critical care was necessary to treat or prevent imminent or life-threatening deterioration.  Critical care was time spent personally by me on the following activities: development of treatment plan with patient and/or surrogate as well as nursing, discussions with consultants, evaluation of patient's response to treatment, examination of patient, obtaining history from patient or surrogate, ordering and performing treatments and interventions, ordering and review of laboratory studies, ordering and review of radiographic studies, pulse oximetry, re-evaluation of patient's condition and participation in multidisciplinary rounds.  Kipp Brood, MD [facility] ICU Physician [place]  Pager: [phone] Mobile: [phone] After hours: [phone].   11/17/2021, 2:01 PM

## 2021-11-17 NOTE — Progress Notes (Signed)
Neurology Progress Note  Brief HPI: 81 y.o. male who was found by his wife on the morning of admission with his top half hanging off of the bed with his eyes open but would not fixate or track his wife and was with incomprehensible sounds. Patient's GCS remained poor on arrival to [facility] and he was intubated for airway protection. EEG was performed at [facility] with frequent seizures on EEG in the left frontal region concerning for partial status epilepticus. He was loaded with 4 g of Keppra and started on propofol. Patient was transferred to [facility] for continuous EEG monitoring and further work up and management.   Subjective: No acute overnight events noted  Objective: Vitals:   11/17/21 0752 11/17/21 0800  BP: 103/75 95/66  Pulse: 85 80  Resp: 20 (!) 22  Temp: 97.9 F (36.6 C) 97.9 F (36.6 C)  SpO2: 96% 95%   Gen: Intubated and sedated in the ICU, sedation not paused for examination, propofol at 30 mcg/kg/min Resp: respirations assisted via mechanical ventilator, there are spontaneous respirations over set ventilator rate Abd: soft, non-distended  Neuro:  Mental Status: Patient is intubated and sedated in the ICU.  He does not open his eyes or follow commands.  Cranial Nerves: PERRL 3 mm and sluggish, no movement to doll's eyes, weak corneal reflexes are intact, + cough, unable to elicit gag response with Yankauer, does not protrude tongue to command. Motor and Sensory:  There is minimal flickering movement of the right upper extremity with application of noxious stimuli.  There is minimal withdrawal of the left upper extremity with application of noxious stimuli.  There is minimal spontaneous bilateral lower extremity triple flexion that is also elicited with application of noxious stimuli. Plantars: Toes upgoing bilaterally Gait: Deferred for patient safety  Pertinent Labs: CBC    Component Value Date/Time   WBC 13.4 (H) 11/17/2021 0146   RBC 4.14 (L) 11/17/2021 0146   HGB  10.5 (L) 11/17/2021 0322   HGB 15.1 01/29/2012 0030   HCT 31.0 (L) 11/17/2021 0322   HCT 46.0 01/29/2012 0030   PLT 147 (L) 11/17/2021 0146   PLT 201 01/29/2012 0030   MCV 82.1 11/17/2021 0146   MCV 87 01/29/2012 0030   MCH 24.6 (L) 11/17/2021 0146   MCHC 30.0 11/17/2021 0146   RDW 21.3 (H) 11/17/2021 0146   RDW 14.5 01/29/2012 0030   LYMPHSABS 5.5 (H) Nov 30, 2021 1006   MONOABS 0.8 30-Nov-2021 1006   EOSABS 0.3 11-30-21 1006   BASOSABS 0.2 (H) 11-30-21 1006   CMP     Component Value Date/Time   NA 139 11/17/2021 0322   NA 139 01/29/2012 0030   K 3.7 11/17/2021 0322   K 4.7 01/29/2012 0030   CL 111 11/17/2021 0146   CL 104 01/29/2012 0030   CO2 16 (L) 11/17/2021 0146   CO2 25 01/29/2012 0030   GLUCOSE 138 (H) 11/17/2021 0146   GLUCOSE 209 (H) 01/29/2012 0030   BUN 23 11/17/2021 0146   BUN 13 01/29/2012 0030   CREATININE 1.17 11/17/2021 0146   CREATININE 0.81 01/29/2012 0030   CALCIUM 7.8 (L) 11/17/2021 0146   CALCIUM 9.2 01/29/2012 0030   PROT 5.5 (L) 11/17/2021 0146   ALBUMIN 2.4 (L) 11/17/2021 0146   AST 22 11/17/2021 0146   ALT 16 11/17/2021 0146   ALKPHOS 72 11/17/2021 0146   BILITOT 0.5 11/17/2021 0146   GFRNONAA >60 11/17/2021 0146   GFRNONAA >60 01/29/2012 0030   GFRAA >60 09/03/2016 1625  GFRAA >60 01/29/2012 0030   Lactic Acid, Venous    Component Value Date/Time   LATICACIDVEN 1.5 11/17/2021 0158    Latest Reference Range & Units 10/29/2021 11:57  Influenza A By PCR NEGATIVE  NEGATIVE  Influenza B By PCR NEGATIVE  NEGATIVE  SARS Coronavirus 2 by RT PCR NEGATIVE  NEGATIVE   Ammonia 2/25: 10  Urinalysis    Component Value Date/Time   COLORURINE YELLOW (A) 11/09/2021 1006   APPEARANCEUR HAZY (A) 11/05/2021 1006   APPEARANCEUR Clear 06/12/2021 0856   LABSPEC 1.020 11/05/2021 1006   LABSPEC 1.010 01/29/2012 1600   PHURINE 5.0 10/23/2021 1006   GLUCOSEU NEGATIVE 11/06/2021 1006   GLUCOSEU 50 mg/dL 18/56/3149 7026   HGBUR NEGATIVE 11/17/2021  1006   BILIRUBINUR NEGATIVE 11/06/2021 1006   BILIRUBINUR Negative 06/12/2021 0856   BILIRUBINUR Negative 01/29/2012 1600   KETONESUR NEGATIVE 11/11/2021 1006   PROTEINUR 30 (A) 11/12/2021 1006   NITRITE NEGATIVE 11/14/2021 1006   LEUKOCYTESUR NEGATIVE 11/07/2021 1006   LEUKOCYTESUR Negative 01/29/2012 1600   Lab Results  Component Value Date   TSH 1.110 11/19/2021   Imaging Reviewed:  [facility] 2/25: No acute intracranial abnormality. 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  CTA head and neck 2/25: No significant change since prior study. No large vessel occlusion, hemodynamically significant stenosis, or evidence of dissection.  Routine EEG 2/25: "Clinical Interpretation: This EEG recorded frequent seizures in the left frontal region, essentially amounting to partial status epilepticus."  Overnight EEG 2/25 -2/26: "This study is suggestive of severe  to profound diffuse encephalopathy, nonspecific etiology but could be related to sedation. No seizures or definite epileptiform discharges were seen throughout the recording."  Assessment: 81 yo male with acute onset coma. EEG revealed partial status epilepticus. Transferred from [facility] for LTM EEG.  - DDx epileptic: Partial status epilepticus is the most likely etiology for his presentation.  - DDx vascular: No evidence of basilar occlusion on angiographic imaging. Embolic shower without LVO would be another consideration, will obtain MRI brain when patient is stabilized. - DDx infectious: No meningismus to suggest meningitis, INR too high for LP. Prefer CNS coverage pending LP (48 hours following last Eliquis dosing). Septic encephalopathy is also possible.  - Other DDx: Another consideration would be prolonged hypotension from afib/RVR that spontaneously converted. This is felt to be the least likely etiology for his presentation. - Overnight EEG s/p intubation and sedation on propofol is without seizures or epileptiform  discharges, with evidence of severe to profound diffuse encephalopathy that could be related to sedation versus postictal state.  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    Recommendations: - Continue LTM EEG - MRI brain once stable, EEG leads are not MRI compatible, switch to MRI compatible leads. 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 Unable to obtain MRI brain until tomorrow) - Further recommendations following MRI brain  - Keppra maintenance dosing: 750 mg IV BID (maximum given his renal dysfunction) - Will need LP once off of Eliquis for 48 hours  - Agree with CNS and sepsis ABX coverage  Lanae Boast, AGACNP-BC [facility] [phone]  Electronically signed: Dr. Caryl Pina

## 2021-11-17 NOTE — Progress Notes (Signed)
°  Transition of Care [facility]) Screening Note   Patient Details  Name: Harry [name] Date of Birth: 1941/07/02   Transition of Care [facility]) CM/SW Contact:    Windle Guard, LCSW Phone Number: 11/17/2021, 8:15 AM    Transition of Care Department [facility]) has reviewed patient and noted no immediate TOC needs pending continued medical work-up. TOC team will continue to monitor patient advancement through interdisciplinary progression rounds to support any identified discharge supports as needed. If new patient transition needs arise, please place a TOC consult or reach out to [facility] team.

## 2021-11-17 NOTE — Progress Notes (Signed)
Per [place], wanted pt on MRI-leads. Patient was on non-MRI ones. MRI is routine; unsure of when patient is going to MRI. Prior break down under FP1/FP2/F7/F8/C3; displacements were made and RN was notified.

## 2021-11-17 NOTE — Progress Notes (Signed)
Regarding pt's currently ordered MRI, spoke with Dr Collins Scotland (Neuroradiologist) regarding pt being vented and not able to give verbal feedback during scan (one of the scan conditions for pt's currently implanted MR conditional leadless pacemaker device). Per Dr Collins Scotland (Neuroradiologist), ok to scan pt in not A/O vented state due to pt having been scanned here in the past with device implanted. All other device conditions must be followed. Will plan for currently ordered MRI accordingly.

## 2021-11-18 ENCOUNTER — Inpatient Hospital Stay (HOSPITAL_COMMUNITY): Payer: Medicare HMO

## 2021-11-18 DIAGNOSIS — N401 Enlarged prostate with lower urinary tract symptoms: Secondary | ICD-10-CM | POA: Diagnosis not present

## 2021-11-18 DIAGNOSIS — G9340 Encephalopathy, unspecified: Secondary | ICD-10-CM | POA: Diagnosis not present

## 2021-11-18 DIAGNOSIS — G40901 Epilepsy, unspecified, not intractable, with status epilepticus: Secondary | ICD-10-CM | POA: Diagnosis not present

## 2021-11-18 DIAGNOSIS — N179 Acute kidney failure, unspecified: Secondary | ICD-10-CM | POA: Diagnosis not present

## 2021-11-18 DIAGNOSIS — R402431 Glasgow coma scale score 3-8, in the field [EMT or ambulance]: Secondary | ICD-10-CM | POA: Diagnosis not present

## 2021-11-18 LAB — GLUCOSE, CSF: Glucose, CSF: 108 mg/dL — ABNORMAL HIGH (ref 40–70)

## 2021-11-18 LAB — COMPREHENSIVE METABOLIC PANEL
ALT: 14 U/L (ref 0–44)
AST: 19 U/L (ref 15–41)
Albumin: 2.2 g/dL — ABNORMAL LOW (ref 3.5–5.0)
Alkaline Phosphatase: 82 U/L (ref 38–126)
Anion gap: 10 (ref 5–15)
BUN: 14 mg/dL (ref 8–23)
CO2: 19 mmol/L — ABNORMAL LOW (ref 22–32)
Calcium: 7.6 mg/dL — ABNORMAL LOW (ref 8.9–10.3)
Chloride: 109 mmol/L (ref 98–111)
Creatinine, Ser: 0.97 mg/dL (ref 0.61–1.24)
GFR, Estimated: >60 mL/min (ref >60)
Glucose, Bld: 226 mg/dL — ABNORMAL HIGH (ref 70–99)
Potassium: 3.7 mmol/L (ref 3.5–5.1)
Sodium: 138 mmol/L (ref 135–145)
Total Bilirubin: 0.5 mg/dL (ref 0.3–1.2)
Total Protein: 5.5 g/dL — ABNORMAL LOW (ref 6.5–8.1)

## 2021-11-18 LAB — PROTEIN, CSF: Total  Protein, CSF: 39 mg/dL (ref 15–45)

## 2021-11-18 LAB — CSF CELL COUNT WITH DIFFERENTIAL
Eosinophils, CSF: 5 % — ABNORMAL HIGH (ref 0–1)
Lymphs, CSF: 24 % — ABNORMAL LOW (ref 40–80)
Monocyte-Macrophage-Spinal Fluid: 13 % — ABNORMAL LOW (ref 15–45)
RBC Count, CSF: 30500 /mm3 — ABNORMAL HIGH
RBC Count, CSF: 1 /mm3 — ABNORMAL HIGH
Segmented Neutrophils-CSF: 58 % — ABNORMAL HIGH (ref 0–6)
Tube #: 1
Tube #: 3
WBC, CSF: 27 /mm3 (ref 0–5)
WBC, CSF: 2 /mm3 (ref 0–5)

## 2021-11-18 LAB — CBC
HCT: 32.5 % — ABNORMAL LOW (ref 39.0–52.0)
Hemoglobin: 9.9 g/dL — ABNORMAL LOW (ref 13.0–17.0)
MCH: 24.6 pg — ABNORMAL LOW (ref 26.0–34.0)
MCHC: 30.5 g/dL (ref 30.0–36.0)
MCV: 80.6 fL (ref 80.0–100.0)
Platelets: 121 10*3/uL — ABNORMAL LOW (ref 150–400)
RBC: 4.03 MIL/uL — ABNORMAL LOW (ref 4.22–5.81)
RDW: 21.2 % — ABNORMAL HIGH (ref 11.5–15.5)
WBC: 8.9 10*3/uL (ref 4.0–10.5)
nRBC: 0 % (ref 0.0–0.2)

## 2021-11-18 LAB — GLUCOSE, CAPILLARY
Glucose-Capillary: 108 mg/dL — ABNORMAL HIGH (ref 70–99)
Glucose-Capillary: 115 mg/dL — ABNORMAL HIGH (ref 70–99)
Glucose-Capillary: 134 mg/dL — ABNORMAL HIGH (ref 70–99)
Glucose-Capillary: 177 mg/dL — ABNORMAL HIGH (ref 70–99)
Glucose-Capillary: 190 mg/dL — ABNORMAL HIGH (ref 70–99)
Glucose-Capillary: 201 mg/dL — ABNORMAL HIGH (ref 70–99)

## 2021-11-18 LAB — MAGNESIUM: Magnesium: 1.3 mg/dL — ABNORMAL LOW (ref 1.7–2.4)

## 2021-11-18 LAB — PHOSPHORUS: Phosphorus: 2.3 mg/dL — ABNORMAL LOW (ref 2.5–4.6)

## 2021-11-18 MED ORDER — VITAL 1.5 CAL PO LIQD
1000.0000 mL | ORAL | Status: DC
  Administered 2021-11-18 – 2021-11-22 (×6): 1000 mL
  Filled 2021-11-18 (×2): qty 1000

## 2021-11-18 MED ORDER — PROSOURCE TF PO LIQD
45.0000 mL | Freq: Two times a day (BID) | ORAL | Status: DC
Start: 2021-11-18 — End: 2021-11-22
  Administered 2021-11-18 – 2021-11-22 (×9): 45 mL
  Filled 2021-11-18 (×9): qty 45

## 2021-11-18 NOTE — Progress Notes (Addendum)
Neurology Progress Note  Brief HPI: 81 y.o. male who was found by his wife on the morning of admission with his top half hanging off of the bed with his eyes open but would not fixate or track his wife and was with incomprehensible sounds. Patient's GCS remained poor on arrival to [facility] and he was intubated for airway protection. EEG was performed at [facility] with frequent seizures on EEG in the left frontal region concerning for partial status epilepticus. He was loaded with 4 g of Keppra and started on propofol. Patient was transferred to [facility] for continuous EEG monitoring and further work up and management.   Subjective: No acute overnight events noted. Patient was able to follow some commands but unable to open eyes and does not track.  Objective: Vitals:   11/18/21 0900 11/18/21 1000  BP: 114/72 (!) 96/58  Pulse: 89 84  Resp: 19 (!) 21  Temp: 99.5 F (37.5 C)   SpO2: 96% 94%   Gen: Intubated in ICU. No longer on sedation. Resp: respirations assisted via mechanical ventilator, there are spontaneous respirations over set ventilator rate Abd: soft, non-distended  Neuro:  Mental Status: Patient is intubated and sedated in the ICU.  He does not open eyes but is able to follow some commands.  Cranial Nerves: PERRL 3 mm and sluggish, no movement to doll's eyes, weak corneal reflexes are intact, + cough, does not protrude tongue to command. Motor and Sensory:  Able to do thumbs up on command and to slide hand across body. Does not appear to be able lift antigravity or follow 2 step commands Plantars: Toes upgoing bilaterally Gait: Deferred for patient safety  Pertinent Labs: CBC    Component Value Date/Time   WBC 13.4 (H) 11/17/2021 0146   RBC 4.14 (L) 11/17/2021 0146   HGB 10.5 (L) 11/17/2021 0322   HGB 15.1 01/29/2012 0030   HCT 31.0 (L) 11/17/2021 0322   HCT 46.0 01/29/2012 0030   PLT 147 (L) 11/17/2021 0146   PLT 201 01/29/2012 0030   MCV 82.1 11/17/2021 0146   MCV 87  01/29/2012 0030   MCH 24.6 (L) 11/17/2021 0146   MCHC 30.0 11/17/2021 0146   RDW 21.3 (H) 11/17/2021 0146   RDW 14.5 01/29/2012 0030   LYMPHSABS 5.5 (H) 12-12-21 1006   MONOABS 0.8 12-12-2021 1006   EOSABS 0.3 12-Dec-2021 1006   BASOSABS 0.2 (H) Dec 12, 2021 1006   CMP     Component Value Date/Time   NA 139 11/17/2021 0322   NA 139 01/29/2012 0030   K 3.7 11/17/2021 0322   K 4.7 01/29/2012 0030   CL 111 11/17/2021 0146   CL 104 01/29/2012 0030   CO2 16 (L) 11/17/2021 0146   CO2 25 01/29/2012 0030   GLUCOSE 138 (H) 11/17/2021 0146   GLUCOSE 209 (H) 01/29/2012 0030   BUN 23 11/17/2021 0146   BUN 13 01/29/2012 0030   CREATININE 1.17 11/17/2021 0146   CREATININE 0.81 01/29/2012 0030   CALCIUM 7.8 (L) 11/17/2021 0146   CALCIUM 9.2 01/29/2012 0030   PROT 5.5 (L) 11/17/2021 0146   ALBUMIN 2.4 (L) 11/17/2021 0146   AST 22 11/17/2021 0146   ALT 16 11/17/2021 0146   ALKPHOS 72 11/17/2021 0146   BILITOT 0.5 11/17/2021 0146   GFRNONAA >60 11/17/2021 0146   GFRNONAA >60 01/29/2012 0030   GFRAA >60 09/03/2016 1625   GFRAA >60 01/29/2012 0030   Lactic Acid, Venous    Component Value Date/Time   LATICACIDVEN 1.5  11/17/2021 0158    Latest Reference Range & Units 11/05/2021 11:57  Influenza A By PCR NEGATIVE  NEGATIVE  Influenza B By PCR NEGATIVE  NEGATIVE  SARS Coronavirus 2 by RT PCR NEGATIVE  NEGATIVE   Ammonia 2/25: 10  Urinalysis    Component Value Date/Time   COLORURINE YELLOW (A) 11/08/2021 1006   APPEARANCEUR HAZY (A) 10/24/2021 1006   APPEARANCEUR Clear 06/12/2021 0856   LABSPEC 1.020 11/12/2021 1006   LABSPEC 1.010 01/29/2012 1600   PHURINE 5.0 11/13/2021 1006   GLUCOSEU NEGATIVE 10/24/2021 1006   GLUCOSEU 50 mg/dL 69/67/8938 1017   HGBUR NEGATIVE 11/08/2021 1006   BILIRUBINUR NEGATIVE 11/11/2021 1006   BILIRUBINUR Negative 06/12/2021 0856   BILIRUBINUR Negative 01/29/2012 1600   KETONESUR NEGATIVE 11/10/2021 1006   PROTEINUR 30 (A) 10/25/2021 1006   NITRITE  NEGATIVE 10/30/2021 1006   LEUKOCYTESUR NEGATIVE 11/09/2021 1006   LEUKOCYTESUR Negative 01/29/2012 1600   Lab Results  Component Value Date   TSH 1.110 11/01/2021   Imaging Reviewed:  [facility] 2/25: No acute intracranial abnormality. 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  CTA head and neck 2/25: No significant change since prior study. No large vessel occlusion, hemodynamically significant stenosis, or evidence of dissection.  Overnight EEG 2/26-2/27 "This study showed generalized periodic discharges with triphasic morphology with fluctuating frequency of 1 to 2.5 Hz, seen predominantly when patient was stimulated.  This EEG pattern is suggestive of epileptogenicity with generalized onset and is on the ictal-interictal continuum with intermediate potential for seizure recurrence."  Assessment: 81 yo male with acute onset coma. EEG revealed partial status epilepticus. Transferred from [facility] for LTM EEG.  - DDx epileptic: Partial status epilepticus is the most likely etiology for his presentation.  - DDx vascular: No evidence of basilar occlusion on angiographic imaging. Embolic shower without LVO would be another consideration, will obtain MRI brain when patient is stabilized. - DDx infectious: No meningismus to suggest meningitis, INR too high for LP. Prefer CNS coverage pending LP (48 hours following last Eliquis dosing). Septic encephalopathy is also possible.  - Other DDx: Another consideration would be prolonged hypotension from afib/RVR that spontaneously converted. This is felt to be the least likely etiology for his presentation.  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    Recommendations: - Continue LTM EEG - MRI brain once stable - Further recommendations following MRI brain  - Keppra maintenance  dosing: 750 mg IV BID (maximum given his renal dysfunction) - LP today - Agree with CNS and sepsis ABX coverage  Park Pope, MD PGY1 Resident

## 2021-11-18 NOTE — Progress Notes (Signed)
Pt transported to fluoro for IR procedure and back to 4N23 without any complications.

## 2021-11-18 NOTE — Procedures (Addendum)
Patient Name: Harry [name]  MRN: [id number]  Epilepsy Attending: Lora Havens  Referring Physician/Provider: Greta Doom, MD Duration: 11/17/2021 1604 to 11/18/2021 1604   Patient history: 81yo M with ams, eeg showed status epilepticus. Long term eeg for monitoring of seizure.   Level of alertness: comatose   AEDs during EEG study: LEV   Technical aspects: This EEG study was done with scalp electrodes positioned according to the 10-20 International system of electrode placement. Electrical activity was acquired at a sampling rate of 500Hz  and reviewed with a high frequency filter of 70Hz  and a low frequency filter of 1Hz . EEG data were recorded continuously and digitally stored.    Description: EEG showed near continuous generalized polymorphic 3 to 6 Hz theta-delta slowing admixed with 15-18hz  generalized beta activity. EEG also showed generalized periodic discharges with triphasic morphology with fluctuating frequency of 1 to 2.5 Hz, seen predominantly when patient was stimulated.  Patient was also noted to have low amplitude 15 to 16hz  beta activity in either left more than right frontal region which gradually evolved in frequency to 3 to 5 Hz theta-delta slowing and involves left and right hemisphere.  No clinical signs were seen during this EEG change.  This EEG pattern is consistent with seizure without clinical signs arising from left more than right frontal region, average 1 seizure every hour, lasting about 30 seconds to 1 minute each. Hyperventilation and photic stimulation were not performed.     Patient event button was pressed on 11/17/2021 at 1747 during which patient was noted to be coughing. Per RN patient also had eye twitching which was difficult to appreciate on camera. Concomitant EEG before, during and after the event showed continuous generalized 3 to 6 Hz theta-delta slowing admixed with generalized periodic discharges with triphasic morphology at 1-1.5 Hz  without definite evolution.   ABNORMALITY -Seizure without clinical signs, left more than right frontal region - Periodic discharges with triphasic morphology, generalized - Continuous slow, generalized   IMPRESSION: This study showed seizures without clinical signs arising from left more than right frontal region, 1 seizure every hour, lasting about 30 minutes seconds to 1 minute each.  EEG also showed generalized periodic discharges with triphasic morphology with fluctuating frequency of 1 to 2.5 Hz, seen predominantly when patient was stimulated.  This EEG pattern is suggestive of epileptogenicity with generalized onset and is on the ictal-interictal continuum with intermediate potential for seizure recurrence.   Additionally the study is suggestive of severe diffuse encephalopathy, nonspecific etiology.  Patient event button was pressed on 11/17/2018 2020 1747 as described above without definite EEG change to suggest seizure.  This was most likely not an epileptic event.     Shauna Bodkins Barbra Sarks

## 2021-11-18 NOTE — Progress Notes (Signed)
Patient maintenance was performed. Patient does have some skin breakdown and may need some of the frontal leads relocated. Patient event button was tested.

## 2021-11-18 NOTE — Procedures (Signed)
Lumbar Puncture Procedure Note  Harry [name]  [id number]  1941-02-24  Date:11/18/21  Time:12:42 PM   Provider Performing:Adis Sturgill   Procedure: Lumbar Puncture (67341)  Indication(s) Rule out meningitis  Consent Risks of the procedure as well as the alternatives and risks of each were explained to the patient and/or caregiver.  Consent for the procedure was obtained and is signed in the bedside chart  Anesthesia Topical only with 1% lidocaine    Time Out Verified patient identification, verified procedure, site/side was marked, verified correct patient position, special equipment/implants available, medications/allergies/relevant history reviewed, required imaging and test results available.   Sterile Technique Maximal sterile technique including sterile barrier drape, hand hygiene, sterile gown, sterile gloves, mask, hair covering.    Procedure Description Using palpation, approximate location of L3-L4 space identified.   Lidocaine used to anesthetize skin and subcutaneous tissue overlying this area.  A 20g spinal needle was then used to access the subarachnoid space. Opening pressure:Not obtained. Closing pressure:Not obtained. 28mL CSF obtained.  Complications/Tolerance None; patient tolerated the procedure well.   EBL Minimal   Specimen(s) CSF

## 2021-11-18 NOTE — Procedures (Signed)
Lumbar Puncture Procedure Note:  Informed consent was obtained prior to the procedure, including potential complications of headache, allergy, and pain. With the patient prone, the lower back was prepped with Betadine. 1% Lidocaine was used for local anesthesia. Lumbar puncture was performed at the L2-L3 level using a 20 gauge needle with return of clear CSF. 12 ml of CSF were obtained for laboratory studies. The patient tolerated the procedure well and there were no apparent complications.   Loyce Dys, MS RD PA-C

## 2021-11-18 NOTE — Progress Notes (Signed)
Nutrition Follow-up  DOCUMENTATION CODES:   Not applicable  INTERVENTION:   Tube Feeding via OG: Vital 1.5 at 55 ml/hr Pro-Source TF 45 mL BID This provides 111 g of protein, 2060 kcals and 1003 mL of free water    NUTRITION DIAGNOSIS:   Inadequate oral intake related to acute illness as evidenced by NPO status.  GOAL:   Patient will meet greater than or equal to 90% of their needs   MONITOR:   TF tolerance, Vent status, Labs, Weight trends  REASON FOR ASSESSMENT:   Consult, Ventilator Enteral/tube feeding initiation and management  ASSESSMENT:   81 yo male admitted status epilepticus in comatose state, compromised airway requiring intubation. PMH includes CAD, CABG x3, COPD, HTN, DM, stroke, HL, B12 def  Pt remains on vent support. LTM EEG, Noted plan for LP today. MRI brain once stable  Tolerating Vital High Protein at 20 ml/hr via OG. OG tube enters stomach per chest xray  Unable to obtain diet and weight history. Possible weight loss based on weight encounters. Current wt 84 kg; weight of 99.5 kg in December 2022  Labs: reviewed Meds: NS at 75 ml/hr, ss novolog,    Diet Order:   Diet Order             Diet NPO time specified  Diet effective now                   EDUCATION NEEDS:   Not appropriate for education at this time  Skin:  Skin Assessment: Reviewed RN Assessment  Last BM:  PTA  Height:   Ht Readings from Last 1 Encounters:  11/09/2021 5\' 7"  (1.702 m)    Weight:   Wt Readings from Last 1 Encounters:  11/17/21 84 kg     BMI:  Body mass index is 29 kg/m.  Estimated Nutritional Needs:   Kcal:  2000-2200 kcals  Protein:  100-120 g  Fluid:  >/= 2 L   Kerman Passey MS, RDN, LDN, CNSC Registered Dietitian III Clinical Nutrition RD Pager and On-Call Pager Number Located in [place]

## 2021-11-18 NOTE — Progress Notes (Signed)
° °  NAME:  Harry [name], MRN:  [id number], DOB:  08/09/1941, LOS: 2 ADMISSION DATE:  11/08/2021, CONSULTATION DATE:  11/17/2021 REFERRING MD:  Dr. Jerrol Banana, [facility], CHIEF COMPLAINT:  Altered mental status   History of Present Illness:  81 yo male brought to [facility] ER after being found by his wife unresponsive with his head lying off the bed.  He was making gurgling noises.  CBG 80, given D10, no improvement.  Intubated in ER for airway protection.  Neuro consulted.  CT head negative.  EEG showed seizure.  Unable to do LP initial since on DOAC.  Started on keppra, ABx, and acyclovir.  Transferred to [facility] for LTM.  Pertinent  Medical History  Atrial Fib (on DOAC), CAD, AS, s/p 3V CABG x3 & AVR (2023), COPD, HTN, HL, B12 def, BPH, DDD, Diabetes (on insulin), diabetic retinopathy, Obesity, OSA, chronic stroke  Significant Hospital Events: Including procedures, antibiotic start and stop dates in addition to other pertinent events   2/25 admitted after being found unresponsive by wife. No nuchal rigidity Intubated for airway protection. Initial CT of head/ CT angiogram: both w/ no acute findings. EEG showed active seizure. Lactic acid 7.2. wbc 14.4,. Cultures sent. LP not able to be done due to home DOAC. Transferred to [facility] for LTM w/ status epilepticus concern for possible CNS infection: stated on: acyclovir, cefepime and vanc .   Interim History / Subjective:   No significant overnight events. Minimal improvement in mental status.   Objective   Blood pressure (!) 105/54, pulse 87, temperature 99.5 F (37.5 C), resp. rate 16, height 5\' 7"  (1.702 m), weight 84 kg, SpO2 95 %.    Vent Mode: PRVC FiO2 (%):  [30 %] 30 % Set Rate:  [16 bmp] 16 bmp Vt Set:  [540 mL] 540 mL PEEP:  [5 cmH20] 5 cmH20 Plateau Pressure:  [14 cmH20-16 cmH20] 14 cmH20   Intake/Output Summary (Last 24 hours) at 11/18/2021 1207 Last data filed at 11/18/2021 1100 Gross per 24 hour  Intake 2640.45 ml  Output 650 ml  Net 1990.45 ml     Filed Weights   11/17/21 0429  Weight: 84 kg    Examination:  General: chronically ill appearing, intubated Cardiac: RRR, no LE edema Pulm: intubated. Lung sounds clear GI: soft, non-distended Skin: no rash Neuro: off sedation. Follows commands inconsistently. PERRL. Cough reflex intact. Weak hand grip.   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  LTM with intermediate potential of generalized epileptogenicity Sedation off since 4:30PM yesterday; minimal improvement in mental status NGTD on blood cultures - Continue full support on MV. Can do SBRT if mentation improves - Brain MRI today - Continue LTM - Unable to obtain much CSF from LP today. Continue empiric meningitis antibiotics including vancomycin and acyclovir. Follow up cell count and HSV PCR - Continue keppra  Continue current management for the below chronic problems: COPD CAD s/p 3v CABG with AVR 09/24/21, Afib, HTN, HLD, CHF Type 2DM  Best Practice (right click and "Reselect all SmartList Selections" daily)   Diet/type: tubefeeds DVT prophylaxis: SCD GI prophylaxis: PPI Lines: N/A Foley:  Yes, and it is still needed Code Status:  full code Last date of multidisciplinary goals of care discussion: family updated at [place], MD Internal Medicine Resident PGY-3 [facility] 11/18/2021 12:36 PM

## 2021-11-19 ENCOUNTER — Inpatient Hospital Stay (HOSPITAL_COMMUNITY): Payer: Medicare HMO

## 2021-11-19 DIAGNOSIS — G40901 Epilepsy, unspecified, not intractable, with status epilepticus: Secondary | ICD-10-CM | POA: Diagnosis not present

## 2021-11-19 DIAGNOSIS — I2510 Atherosclerotic heart disease of native coronary artery without angina pectoris: Secondary | ICD-10-CM

## 2021-11-19 DIAGNOSIS — R402431 Glasgow coma scale score 3-8, in the field [EMT or ambulance]: Secondary | ICD-10-CM | POA: Diagnosis not present

## 2021-11-19 DIAGNOSIS — N179 Acute kidney failure, unspecified: Secondary | ICD-10-CM | POA: Diagnosis not present

## 2021-11-19 DIAGNOSIS — R569 Unspecified convulsions: Secondary | ICD-10-CM | POA: Diagnosis not present

## 2021-11-19 DIAGNOSIS — Z8673 Personal history of transient ischemic attack (TIA), and cerebral infarction without residual deficits: Secondary | ICD-10-CM

## 2021-11-19 DIAGNOSIS — G9340 Encephalopathy, unspecified: Secondary | ICD-10-CM | POA: Diagnosis not present

## 2021-11-19 LAB — COMPREHENSIVE METABOLIC PANEL
ALT: 14 U/L (ref 0–44)
AST: 18 U/L (ref 15–41)
Albumin: 2.1 g/dL — ABNORMAL LOW (ref 3.5–5.0)
Alkaline Phosphatase: 86 U/L (ref 38–126)
Anion gap: 8 (ref 5–15)
BUN: 14 mg/dL (ref 8–23)
CO2: 19 mmol/L — ABNORMAL LOW (ref 22–32)
Calcium: 7.6 mg/dL — ABNORMAL LOW (ref 8.9–10.3)
Chloride: 110 mmol/L (ref 98–111)
Creatinine, Ser: 1 mg/dL (ref 0.61–1.24)
GFR, Estimated: >60 mL/min (ref >60)
Glucose, Bld: 200 mg/dL — ABNORMAL HIGH (ref 70–99)
Potassium: 3.7 mmol/L (ref 3.5–5.1)
Sodium: 137 mmol/L (ref 135–145)
Total Bilirubin: 0.2 mg/dL — ABNORMAL LOW (ref 0.3–1.2)
Total Protein: 5.5 g/dL — ABNORMAL LOW (ref 6.5–8.1)

## 2021-11-19 LAB — PATHOLOGIST SMEAR REVIEW

## 2021-11-19 LAB — CBC WITH DIFFERENTIAL/PLATELET
Abs Immature Granulocytes: 0.01 10*3/uL (ref 0.00–0.07)
Basophils Absolute: 0.1 10*3/uL (ref 0.0–0.1)
Basophils Relative: 1 %
Eosinophils Absolute: 0.4 10*3/uL (ref 0.0–0.5)
Eosinophils Relative: 5 %
HCT: 31.4 % — ABNORMAL LOW (ref 39.0–52.0)
Hemoglobin: 10 g/dL — ABNORMAL LOW (ref 13.0–17.0)
Immature Granulocytes: 0 %
Lymphocytes Relative: 36 %
Lymphs Abs: 3 10*3/uL (ref 0.7–4.0)
MCH: 25.4 pg — ABNORMAL LOW (ref 26.0–34.0)
MCHC: 31.8 g/dL (ref 30.0–36.0)
MCV: 79.7 fL — ABNORMAL LOW (ref 80.0–100.0)
Monocytes Absolute: 0.8 10*3/uL (ref 0.1–1.0)
Monocytes Relative: 10 %
Neutro Abs: 4.1 10*3/uL (ref 1.7–7.7)
Neutrophils Relative %: 48 %
Platelets: 123 10*3/uL — ABNORMAL LOW (ref 150–400)
RBC: 3.94 MIL/uL — ABNORMAL LOW (ref 4.22–5.81)
RDW: 21.1 % — ABNORMAL HIGH (ref 11.5–15.5)
WBC: 8.4 10*3/uL (ref 4.0–10.5)
nRBC: 0 % (ref 0.0–0.2)

## 2021-11-19 LAB — GLUCOSE, CAPILLARY
Glucose-Capillary: 115 mg/dL — ABNORMAL HIGH (ref 70–99)
Glucose-Capillary: 155 mg/dL — ABNORMAL HIGH (ref 70–99)
Glucose-Capillary: 187 mg/dL — ABNORMAL HIGH (ref 70–99)
Glucose-Capillary: 199 mg/dL — ABNORMAL HIGH (ref 70–99)
Glucose-Capillary: 205 mg/dL — ABNORMAL HIGH (ref 70–99)
Glucose-Capillary: 206 mg/dL — ABNORMAL HIGH (ref 70–99)

## 2021-11-19 LAB — MAGNESIUM
Magnesium: 1.3 mg/dL — ABNORMAL LOW (ref 1.7–2.4)
Magnesium: 2.2 mg/dL (ref 1.7–2.4)

## 2021-11-19 LAB — CYTOLOGY - NON PAP

## 2021-11-19 LAB — PHOSPHORUS: Phosphorus: 2.2 mg/dL — ABNORMAL LOW (ref 2.5–4.6)

## 2021-11-19 IMAGING — MR MR HEAD WO/W CM
18 of 19 series · 44 of 48 positions shown · IV contrast (gadavist)
Comparison: CT head [DATE], MR head [DATE]

CLINICAL DATA: Seizure, new onset

EXAM:
MRI HEAD WITHOUT AND WITH CONTRAST
TECHNIQUE: Multiplanar, multiecho pulse sequences of the brain and surrounding
structures were obtained without and with intravenous contrast.
CONTRAST:  8mL GADAVIST GADOBUTROL 1 MMOL/ML IV SOLN

[Series 5: DWI · axial · 3.0mm · 0.88mm/px · z∈[-110,+35]mm · 6 of 100 slices shown (1 of 4)]
[im 1/100]
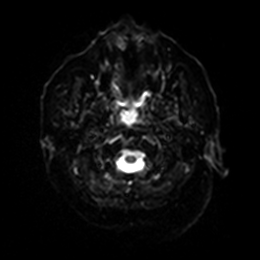
[im 20/100]
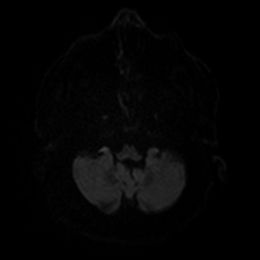
[im 40/100]
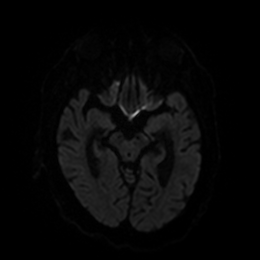
[im 60/100]
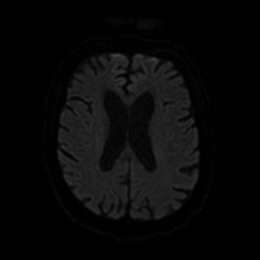
[im 80/100]
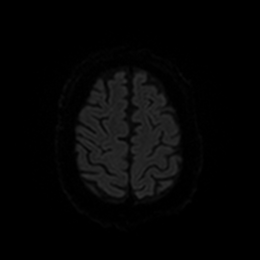
[im 100/100]
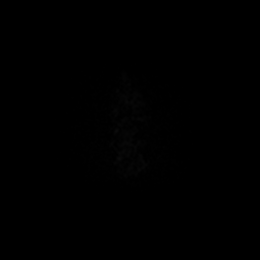

[Series 6: DWI · axial · 3.0mm · 0.88mm/px · z∈[-110,+35]mm · 3 of 50 slices shown (2 of 4)]
[im 1/50]
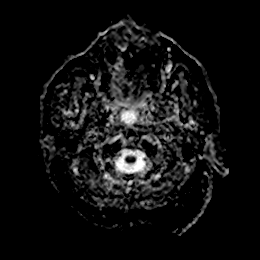
[im 25/50]
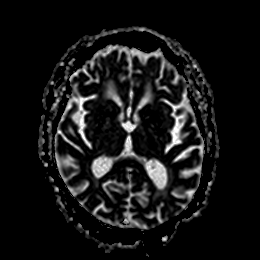
[im 50/50]
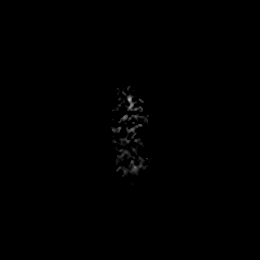

[Series 7: DWI · coronal · 4.0mm · 0.88mm/px · 3 of 64 slices shown (3 of 4)]
[im 1/64]
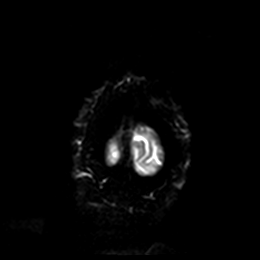
[im 32/64]
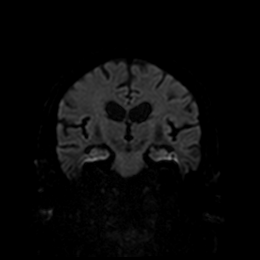
[im 64/64]
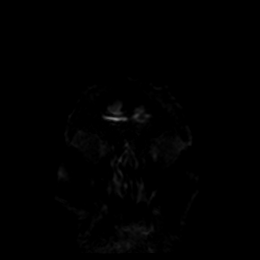

[Series 8: DWI · coronal · 4.0mm · 0.88mm/px · 1 of 32 slices shown (4 of 4)]
[im 1/32]
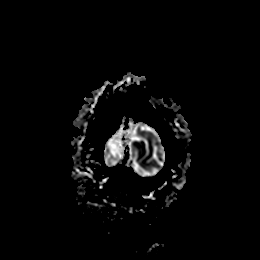

[Series 9: T1 · sagittal · 5.0mm · 0.75mm/px · 1 of 23 slices shown]
[im 1/23]
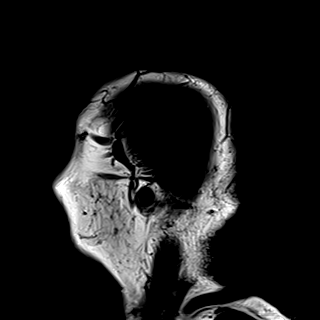

[Series 10: T2 · axial · 5.0mm · 0.72mm/px · 1 of 25 slices shown (1 of 2)]
[im 1/25]
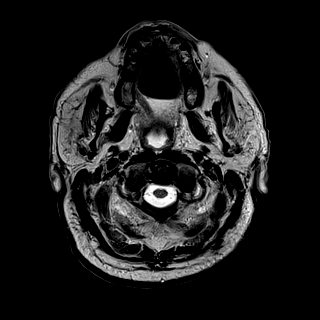

[Series 11: FLAIR · axial · 5.0mm · 0.45mm/px · 1 of 25 slices shown (1 of 2)]
[im 1/25]
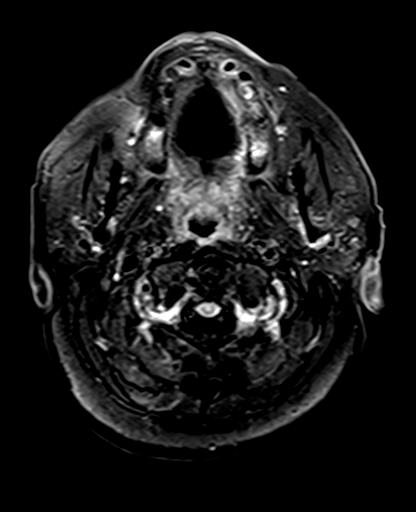

[Series 12: mag_images · axial · 3.0mm · 0.90mm/px · z∈[-126,+49]mm · 3 of 60 slices shown]
[im 1/60]
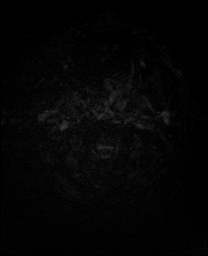
[im 30/60]
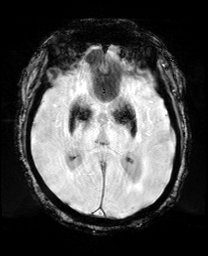
[im 60/60]
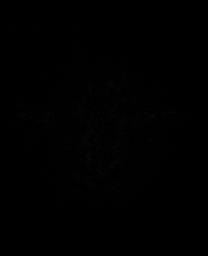

[Series 13: pha_images · axial · 3.0mm · 0.90mm/px · z∈[-123,+43]mm · 3 of 57 slices shown]
[im 1/57]
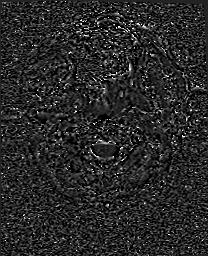
[im 29/57]
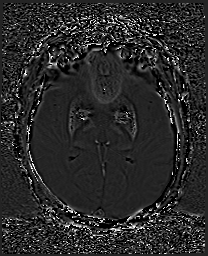
[im 57/57]
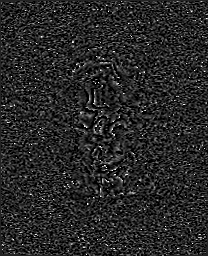

[Series 14: swi_images · axial · 3.0mm · 0.90mm/px · z∈[-126,+49]mm · 3 of 60 slices shown]
[im 1/60]
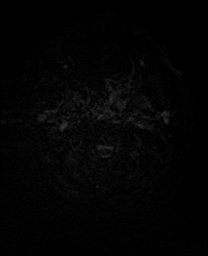
[im 30/60]
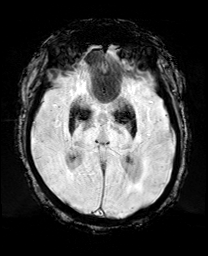
[im 60/60]
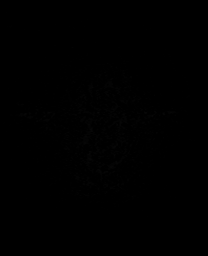

[Series 15: mip_images(sw) · axial · 24.0mm · 0.90mm/px · z∈[-116,+38]mm · 2 of 53 slices shown]
[im 1/53]
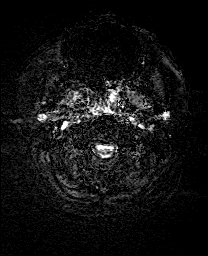
[im 53/53]
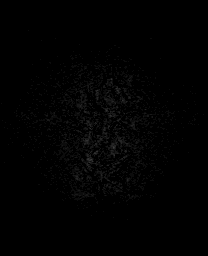

[Series 16: t1_mprage_tra_p2_iso · axial · 1.0mm · 0.98mm/px · z∈[-105,+66]mm · 8 of 176 slices shown]
[im 1/176]
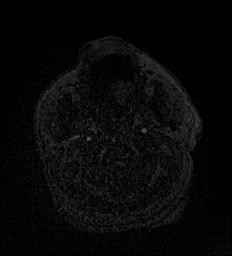
[im 26/176]
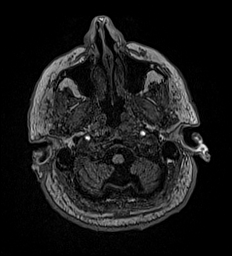
[im 51/176]
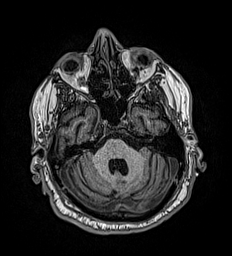
[im 76/176]
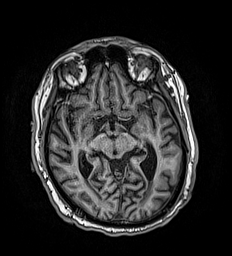
[im 101/176]
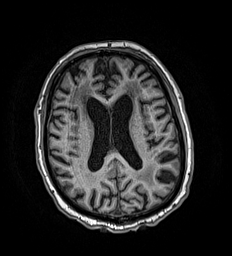
[im 126/176]
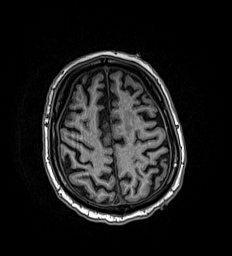
[im 151/176]
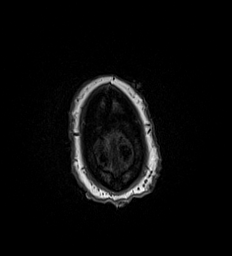
[im 176/176]
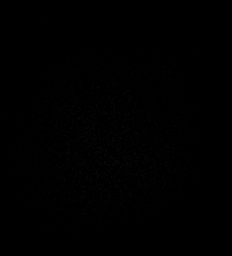

[Series 17: t1_mprage_tra_p2_iso_mpr_coronal · coronal · 1.0mm · 0.45mm/px · 4 of 120 slices shown]
[im 1/120]
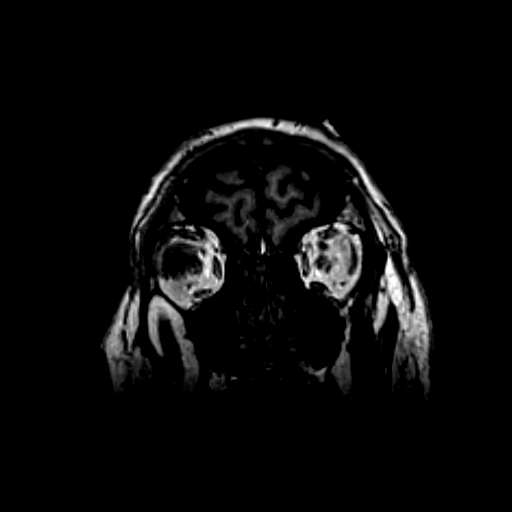
[im 30/120]
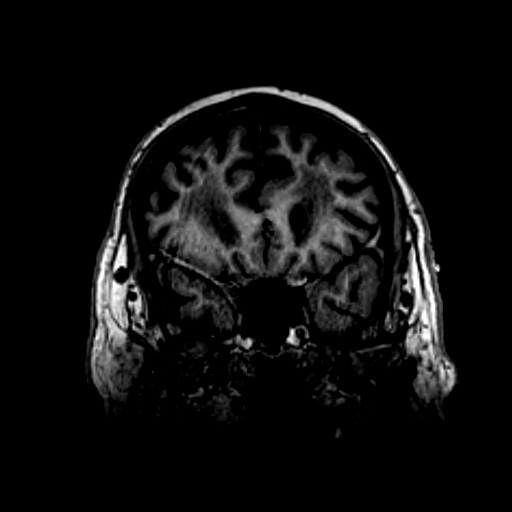
[im 60/120]
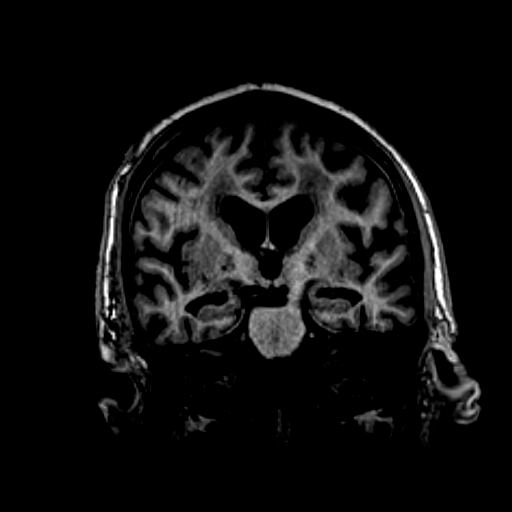
[im 90/120]
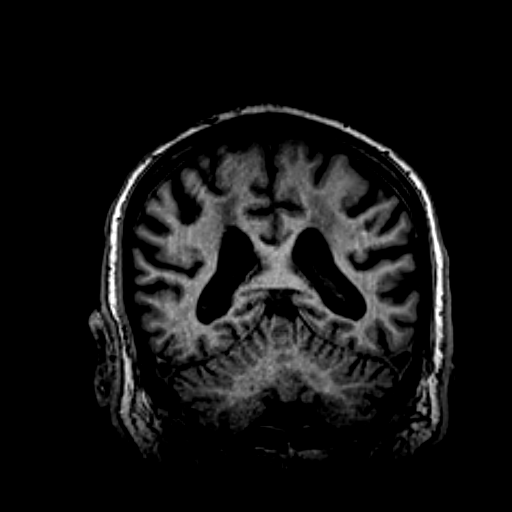

[Series 18: T2 · coronal · 3.0mm · 0.28mm/px · 1 of 32 slices shown (2 of 2)]
[im 1/32]
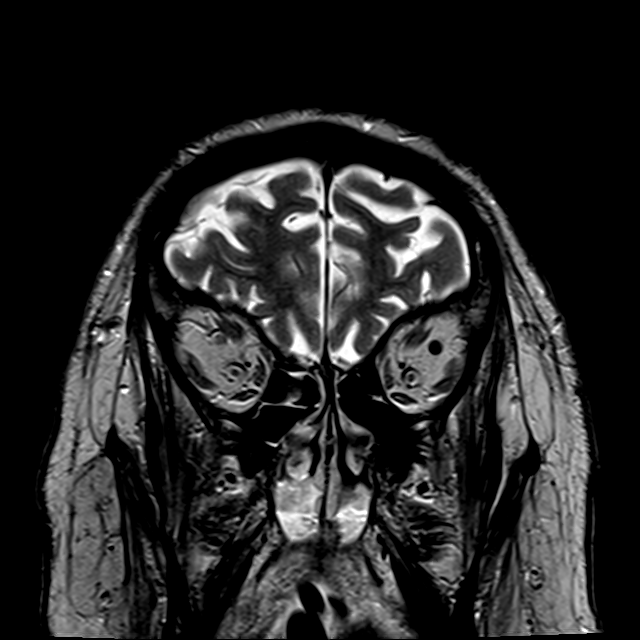

[Series 19: FLAIR · coronal · 3.0mm · 0.56mm/px · 1 of 32 slices shown (2 of 2)]
[im 1/32]
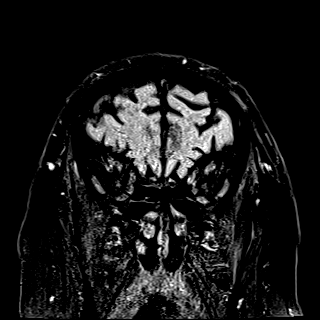

[Series 20: T2 post-contrast · coronal · 5.0mm · 0.72mm/px · 1 of 28 slices shown]
[im 1/28]
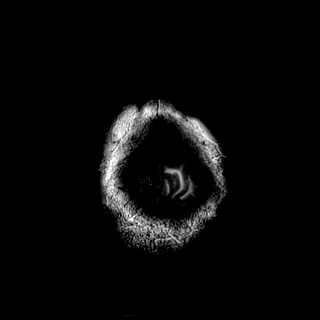

[Series 22: T1 post-contrast · coronal · 5.0mm · 0.34mm/px · 1 of 28 slices shown (1 of 2)]
[im 1/28]
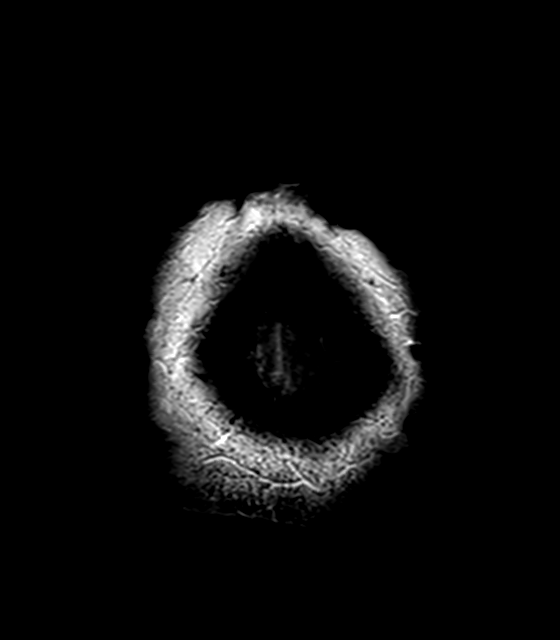

[Series 23: T1 post-contrast · sagittal · 5.0mm · 0.72mm/px · 1 of 23 slices shown (2 of 2)]
[im 1/23]
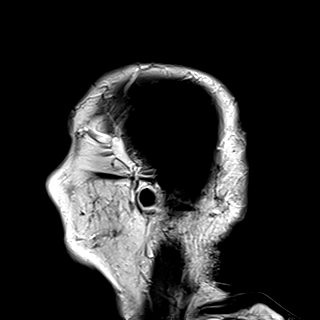

[44 of 48 positions shown; findings below may reference images not displayed]

FINDINGS: Brain: There are small foci of diffusion restriction in the
bilateral corona radiata/centrum semiovale with faint corresponding
low ADC signal consistent with small acute to early subacute
infarcts. There is no associated hemorrhage or mass effect. There is
no other evidence of acute infarct.

There is no evidence of acute intracranial hemorrhage or extra-axial
fluid collection.

There is a background of moderate global parenchymal volume loss
with prominence of the ventricular system and extra-axial CSF
spaces. Confluent FLAIR signal abnormality throughout the
subcortical and periventricular white matter likely reflects sequela
of moderate chronic white matter microangiopathy. There are
scattered punctate chronic microhemorrhages, nonspecific. There are
small remote lacunar infarcts in the bilateral caudate heads.

There is mild bilateral hippocampal atrophy. There is no hippocampal
signal abnormality.

There is no abnormal enhancement. There is no mass lesion. There is
no mass effect or midline shift.

Vascular: Normal flow voids.

Skull and upper cervical spine: Normal marrow signal.

Sinuses/Orbits: The paranasal sinuses are clear. Bilateral lens
implants are in place. The globes and orbits are otherwise
unremarkable.

Other: There is fluid in the imaged airway, likely related to
instrumentation
IMPRESSION: 1. Small acute to early subacute infarcts in the bilateral corona
radiata/centrum semiovale. No other evidence of acute intracranial
pathology.
2. Moderate global parenchymal volume loss and chronic white matter
microangiopathy, and small remote lacunar infarcts in the bilateral
caudate heads.

## 2021-11-19 MED ORDER — MAGNESIUM SULFATE 4 GM/100ML IV SOLN
4.0000 g | Freq: Once | INTRAVENOUS | Status: AC
  Administered 2021-11-19: 4 g via INTRAVENOUS
  Filled 2021-11-19: qty 100

## 2021-11-19 MED ORDER — NOREPINEPHRINE 4 MG/250ML-% IV SOLN
INTRAVENOUS | Status: AC
Start: 2021-11-19 — End: 2021-11-20
  Filled 2021-11-19: qty 250

## 2021-11-19 MED ORDER — POTASSIUM CHLORIDE 20 MEQ PO PACK
20.0000 meq | PACK | Freq: Once | ORAL | Status: AC
  Administered 2021-11-19: 20 meq
  Filled 2021-11-19: qty 1

## 2021-11-19 MED ORDER — GADOBUTROL 1 MMOL/ML IV SOLN
8.0000 mL | Freq: Once | INTRAVENOUS | Status: AC | PRN
  Administered 2021-11-19: 8 mL via INTRAVENOUS

## 2021-11-19 MED ORDER — VALPROATE SODIUM 100 MG/ML IV SOLN
1500.0000 mg | Freq: Once | INTRAVENOUS | Status: AC
  Administered 2021-11-19: 1500 mg via INTRAVENOUS
  Filled 2021-11-19: qty 15

## 2021-11-19 MED ORDER — LORAZEPAM 2 MG/ML IJ SOLN
2.0000 mg | Freq: Once | INTRAMUSCULAR | Status: AC
Start: 2021-11-19 — End: 2021-11-19

## 2021-11-19 MED ORDER — LEVETIRACETAM IN NACL 1500 MG/100ML IV SOLN
1500.0000 mg | Freq: Two times a day (BID) | INTRAVENOUS | Status: DC
Start: 2021-11-19 — End: 2021-11-23
  Administered 2021-11-19 – 2021-11-23 (×9): 1500 mg via INTRAVENOUS
  Filled 2021-11-19 (×10): qty 100

## 2021-11-19 MED ORDER — NOREPINEPHRINE 4 MG/250ML-% IV SOLN
2.0000 ug/min | INTRAVENOUS | Status: DC
  Administered 2021-11-19: 2 ug/min via INTRAVENOUS
  Administered 2021-11-20 (×2): 4 ug/min via INTRAVENOUS
  Filled 2021-11-19 (×2): qty 250

## 2021-11-19 MED ORDER — SODIUM CHLORIDE 0.9 % IV SOLN
250.0000 mL | INTRAVENOUS | Status: DC
Start: 2021-11-19 — End: 2021-11-20

## 2021-11-19 MED ORDER — MAGNESIUM SULFATE 2 GM/50ML IV SOLN
2.0000 g | Freq: Once | INTRAVENOUS | Status: AC
  Administered 2021-11-19: 2 g via INTRAVENOUS
  Filled 2021-11-19: qty 50

## 2021-11-19 MED ORDER — POTASSIUM PHOSPHATES 15 MMOLE/5ML IV SOLN
15.0000 mmol | Freq: Once | INTRAVENOUS | Status: AC
  Administered 2021-11-19: 15 mmol via INTRAVENOUS
  Filled 2021-11-19: qty 5

## 2021-11-19 MED ORDER — INSULIN GLARGINE-YFGN 100 UNIT/ML ~~LOC~~ SOLN
5.0000 [IU] | Freq: Every day | SUBCUTANEOUS | Status: DC
  Administered 2021-11-19: 5 [IU] via SUBCUTANEOUS
  Filled 2021-11-19 (×2): qty 0.05

## 2021-11-19 MED ORDER — INSULIN ASPART 100 UNIT/ML IJ SOLN
0.0000 [IU] | INTRAMUSCULAR | Status: DC
  Administered 2021-11-19 (×3): 4 [IU] via SUBCUTANEOUS
  Administered 2021-11-20: 7 [IU] via SUBCUTANEOUS
  Administered 2021-11-20: 11 [IU] via SUBCUTANEOUS
  Administered 2021-11-20: 7 [IU] via SUBCUTANEOUS
  Administered 2021-11-20: 4 [IU] via SUBCUTANEOUS
  Administered 2021-11-20 (×2): 7 [IU] via SUBCUTANEOUS
  Administered 2021-11-21: 15 [IU] via SUBCUTANEOUS
  Administered 2021-11-21: 11 [IU] via SUBCUTANEOUS
  Administered 2021-11-21: 15 [IU] via SUBCUTANEOUS
  Administered 2021-11-21 (×2): 11 [IU] via SUBCUTANEOUS
  Administered 2021-11-21: 7 [IU] via SUBCUTANEOUS
  Administered 2021-11-22: 4 [IU] via SUBCUTANEOUS
  Administered 2021-11-22: 7 [IU] via SUBCUTANEOUS

## 2021-11-19 MED ORDER — LORAZEPAM 2 MG/ML IJ SOLN
INTRAMUSCULAR | Status: AC
  Administered 2021-11-19: 2 mg
  Filled 2021-11-19: qty 1

## 2021-11-19 MED ORDER — VALPROATE SODIUM 100 MG/ML IV SOLN
500.0000 mg | Freq: Three times a day (TID) | INTRAVENOUS | Status: DC
  Administered 2021-11-19 – 2021-11-23 (×12): 500 mg via INTRAVENOUS
  Filled 2021-11-19 (×16): qty 5

## 2021-11-19 MED ORDER — LORAZEPAM 2 MG/ML IJ SOLN
2.0000 mg | INTRAMUSCULAR | Status: AC
  Administered 2021-11-19: 2 mg via INTRAVENOUS
  Filled 2021-11-19: qty 1

## 2021-11-19 NOTE — Procedures (Addendum)
Patient Name: Harry [name]  MRN: [id number]  Epilepsy Attending: Charlsie Quest  Referring Physician/Provider: Rejeana Brock, MD Duration: 11/18/2021 1604 to 11/19/2021 1604   Patient history: 81yo M with ams, eeg showed status epilepticus. Long term eeg for monitoring of seizure.   Level of alertness: comatose   AEDs during EEG study: LEV   Technical aspects: This EEG study was done with scalp electrodes positioned according to the 10-20 International system of electrode placement. Electrical activity was acquired at a sampling rate of 500Hz  and reviewed with a high frequency filter of 70Hz  and a low frequency filter of 1Hz . EEG data were recorded continuously and digitally stored.    Description: At the beginning of the study, EEG showed low amplitude 15 to 16hz  beta activity left more than right frontal region which gradually evolved in frequency to 3 to 5 Hz theta-delta slowing and involves left and right hemisphere.  No clinical signs were seen during this EEG change.  This EEG pattern is consistent with seizure without clinical signs arising from left more than right frontal region, average 6-7 seizure every hour, lasting about 30 seconds to 1 minute each.  EEG was disconnected between 11/19/2021 1249 to 1450 for MRI.  After patient returned from MRI, he was started on IV propofol. No further seizures were noted.  EEG showed continuous generalized polymorphic 3 to 6 Hz theta-delta slowing.  Hyperventilation and photic stimulation were not performed.       ABNORMALITY -Seizure without clinical signs, left more than right frontal region - Continuous slow, generalized   IMPRESSION: This study initially showed seizures without clinical signs arising from left more than right frontal region, 6-7 seizure every hour, lasting about 30 minutes seconds to 1 minute each.  After around 1450 on 11/19/2021, patient was started on propofol and no further seizures were noted.  Additionally  the study is suggestive of severe diffuse encephalopathy, nonspecific etiology.   [name] 

## 2021-11-19 NOTE — Progress Notes (Signed)
Patient continued to have some skin breakdown so majority of the frontal leads were relocated. Patient does continue to have MRI compatible leads.

## 2021-11-19 NOTE — Progress Notes (Signed)
Secure chat sent to [facility], no orders for Levophed at this time. Advised to reconsult if [place] PIV is needed. Verbalized understanding.

## 2021-11-19 NOTE — Progress Notes (Signed)
[facility] ADULT ICU REPLACEMENT PROTOCOL   The patient does apply for the [facility] Adult ICU Electrolyte Replacment Protocol based on the criteria listed below:   1.Exclusion criteria: TCTS patients, ECMO patients, and Dialysis patients 2. Is GFR >/= 30 ml/min? Yes.    Patient's GFR today is >60 3. Is SCr </= 2? Yes.   Patient's SCr is 1.00 mg/dL 4. Did SCr increase >/= 0.5 in 24 hours? No. 5.Pt's weight >40kg  Yes.   6. Abnormal electrolyte(s): mag 1.3, phos 2.2, K+ 3.7  7. Electrolytes replaced per protocol 8.  Call MD STAT for K+ </= 2.5, Phos </= 1, or Mag </= 1 Physician:  n/a  Darlys Gales 11/19/2021 4:56 AM

## 2021-11-19 NOTE — Progress Notes (Addendum)
Neurology Progress Note  Brief HPI: 81 y.o. male who was found by his wife on the morning of admission with his top half hanging off of the bed with his eyes open but would not fixate or track his wife and was with incomprehensible sounds. Patient's GCS remained poor on arrival to [facility] and he was intubated for airway protection. EEG was performed at [facility] with frequent seizures on EEG in the left frontal region concerning for partial status epilepticus. He was loaded with 4 g of Keppra and started on propofol. Patient was transferred to [facility] for continuous EEG monitoring and further work up and management.   Subjective: No acute overnight events noted. Went for fluoro LP last evening. Daughter and son-in-law at bedside. Explained plan for anti-seizure medication and monitoring of LTM. All questions were addressed.  Objective: Vitals:   11/19/21 0736 11/19/21 0737  BP: 129/68   Pulse: 93   Resp: (!) 21   Temp:    SpO2: 96% 96%   Gen: Intubated in ICU. No longer on sedation. Resp: respirations assisted via mechanical ventilator, there are spontaneous respirations over set ventilator rate Abd: soft, non-distended  Neuro:  Mental Status: Patient is intubated and sedated in the ICU.  He does not open eyes but is able to follow some commands.  Cranial Nerves: PERRL 3 mm and sluggish, no movement to doll's eyes, weak corneal reflexes are intact, + cough, does not protrude tongue to command. Motor and Sensory:  Able to do thumbs up on command and to slide hand across body. Does not appear to be able lift antigravity or follow 2 step commands Plantars: Toes upgoing bilaterally Gait: Deferred for patient safety  Pertinent Labs: CBC    Component Value Date/Time   WBC 8.4 11/19/2021 0242   RBC 3.94 (L) 11/19/2021 0242   HGB 10.0 (L) 11/19/2021 0242   HGB 15.1 01/29/2012 0030   HCT 31.4 (L) 11/19/2021 0242   HCT 46.0 01/29/2012 0030   PLT 123 (L) 11/19/2021 0242   PLT 201 01/29/2012  0030   MCV 79.7 (L) 11/19/2021 0242   MCV 87 01/29/2012 0030   MCH 25.4 (L) 11/19/2021 0242   MCHC 31.8 11/19/2021 0242   RDW 21.1 (H) 11/19/2021 0242   RDW 14.5 01/29/2012 0030   LYMPHSABS 3.0 11/19/2021 0242   MONOABS 0.8 11/19/2021 0242   EOSABS 0.4 11/19/2021 0242   BASOSABS 0.1 11/19/2021 0242   CMP     Component Value Date/Time   NA 137 11/19/2021 0242   NA 139 01/29/2012 0030   K 3.7 11/19/2021 0242   K 4.7 01/29/2012 0030   CL 110 11/19/2021 0242   CL 104 01/29/2012 0030   CO2 19 (L) 11/19/2021 0242   CO2 25 01/29/2012 0030   GLUCOSE 200 (H) 11/19/2021 0242   GLUCOSE 209 (H) 01/29/2012 0030   BUN 14 11/19/2021 0242   BUN 13 01/29/2012 0030   CREATININE 1.00 11/19/2021 0242   CREATININE 0.81 01/29/2012 0030   CALCIUM 7.6 (L) 11/19/2021 0242   CALCIUM 9.2 01/29/2012 0030   PROT 5.5 (L) 11/19/2021 0242   ALBUMIN 2.1 (L) 11/19/2021 0242   AST 18 11/19/2021 0242   ALT 14 11/19/2021 0242   ALKPHOS 86 11/19/2021 0242   BILITOT 0.2 (L) 11/19/2021 0242   GFRNONAA >60 11/19/2021 0242   GFRNONAA >60 01/29/2012 0030   GFRAA >60 09/03/2016 1625   GFRAA >60 01/29/2012 0030   Lactic Acid, Venous    Component Value Date/Time  LATICACIDVEN 1.5 11/17/2021 0158    Latest Reference Range & Units 12-16-2021 11:57  Influenza A By PCR NEGATIVE  NEGATIVE  Influenza B By PCR NEGATIVE  NEGATIVE  SARS Coronavirus 2 by RT PCR NEGATIVE  NEGATIVE   Ammonia 2/25: 10  Urinalysis    Component Value Date/Time   COLORURINE YELLOW (A) 12-16-21 1006   APPEARANCEUR HAZY (A) 16-Dec-2021 1006   APPEARANCEUR Clear 06/12/2021 0856   LABSPEC 1.020 12-16-2021 1006   LABSPEC 1.010 01/29/2012 1600   PHURINE 5.0 December 16, 2021 1006   GLUCOSEU NEGATIVE Dec 16, 2021 1006   GLUCOSEU 50 mg/dL 21/22/4825 0037   HGBUR NEGATIVE Dec 16, 2021 1006   BILIRUBINUR NEGATIVE 16-Dec-2021 1006   BILIRUBINUR Negative 06/12/2021 0856   BILIRUBINUR Negative 01/29/2012 1600   KETONESUR NEGATIVE 2021/12/16 1006    PROTEINUR 30 (A) December 16, 2021 1006   NITRITE NEGATIVE December 16, 2021 1006   LEUKOCYTESUR NEGATIVE 16-Dec-2021 1006   LEUKOCYTESUR Negative 01/29/2012 1600   Lab Results  Component Value Date   TSH 1.110 16-Dec-2021   Imaging Reviewed:  [facility] 2/25: No acute intracranial abnormality. 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  CTA head and neck 2/25: No significant change since prior study. No large vessel occlusion, hemodynamically significant stenosis, or evidence of dissection.  Overnight EEG 2/26-2/27 "This study showed generalized periodic discharges with triphasic morphology with fluctuating frequency of 1 to 2.5 Hz, seen predominantly when patient was stimulated.  This EEG pattern is suggestive of epileptogenicity with generalized onset and is on the ictal-interictal continuum with intermediate potential for seizure recurrence."  Assessment: 81 yo male with acute onset coma. EEG revealed partial status epilepticus. Transferred from [facility] for LTM EEG.  - DDx epileptic: Partial status epilepticus is the most likely etiology for his presentation.  - DDx vascular: No evidence of basilar occlusion on angiographic imaging. Embolic shower without LVO would be another consideration, will obtain MRI brain when patient is stabilized. - DDx infectious: No meningismus to suggest meningitis. Septic encephalopathy is also possible. CSF cell count with differential (Tube 3): Clear, 1 RBC, 2 WBC. CSF Glucose 108, Protein 39 - Other DDx: Another consideration would be prolonged hypotension from afib/RVR that spontaneously converted. This is felt to be the least likely etiology for his presentation.  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 There is some concern whether patient still undergoing seizures. EEG difficult to interpret but he  has continuous GPDs but then at times ( once every hour or so) he has this faster beta activity which starts in left or right frontal region and evolves in frequency and mprphology lasting about a minute. Will load Depakote, max out Keppra, and add Ativan and monitor cEEG - Low Mg may be contributing to seizure threshold - LP not particularly concerning for meningitis with no pleocytosis, no elevated protein. Will continue Acyclovir while waiting on HSV PCR.  Recommendations: - Continue LTM EEG - Replete Magnesium - MRI brain at 1 PM this afternoon (MRI compatible EEG leads on) - Keppra 1500 mg BID - Depakote 1500 mg + Ativan 2 mg to assess for changes in EEG activity - continue Acylcovir while HSV PCR is pending.  Park Pope, MD PGY1 Resident

## 2021-11-19 NOTE — Progress Notes (Signed)
NAME:  Harry [name], MRN:  [id number], DOB:  Feb 28, 1941, LOS: 3 ADMISSION DATE:  11/05/2021, CONSULTATION DATE:  11/03/2021 REFERRING MD:  Dr. Jerrol Banana, [facility], CHIEF COMPLAINT:  Altered mental status   History of Present Illness:  81 yo male brought to [facility] ER after being found by his wife unresponsive with his head lying off the bed.  He was making gurgling noises.  CBG 80, given D10, no improvement.  Intubated in ER for airway protection.  Neuro consulted.  CT head negative.  EEG showed seizure.  Unable to do LP initial since on DOAC.  Started on keppra, ABx, and acyclovir.  Transferred to [facility] for LTM.  Pertinent  Medical History  Atrial Fib (on DOAC), CAD, AS, s/p 3V CABG x3 & AVR (2023), COPD, HTN, HL, B12 def, BPH, DDD, Diabetes (on insulin), diabetic retinopathy, Obesity, OSA, chronic stroke  Significant Hospital Events: Including procedures, antibiotic start and stop dates in addition to other pertinent events   2/25 admitted after being found unresponsive by wife. No nuchal rigidity Intubated for airway protection. Initial CT of head/ CT angiogram: both w/ no acute findings. EEG showed active seizure. Lactic acid 7.2. wbc 14.4,. Cultures sent. LP not able to be done due to home DOAC. Transferred to [facility] for LTM w/ status epilepticus concern for possible CNS infection: stated on: acyclovir, cefepime and vanc .  2/27: still having intermittent seizure activity on EEG; no significant improvement in mentation; IR LP largely unremarkable; MRI delayed until 2/28  Interim History / Subjective:   No significant change in mentation overnight.   Labs reviewed. Remarkable for Phos 2.2, Mag 1.3, platelets stable from yesterday at 123.  CSF fluid largely unremarkable. No organism on gram stain. Cultures in process  Objective   Blood pressure 135/79, pulse 88, temperature 99.1 F (37.3 C), temperature source Axillary, resp. rate 20, height 5\' 7"  (1.702 m), weight 84 kg, SpO2 (!) 88 %.    Vent Mode:  PRVC FiO2 (%):  [30 %] 30 % Set Rate:  [16 bmp] 16 bmp Vt Set:  [540 mL] 540 mL PEEP:  [5 cmH20] 5 cmH20 Plateau Pressure:  [13 cmH20-16 cmH20] 13 cmH20   Intake/Output Summary (Last 24 hours) at 11/19/2021 0858 Last data filed at 11/19/2021 0800 Gross per 24 hour  Intake 3318.82 ml  Output 1100 ml  Net 2218.82 ml    Filed Weights   11/17/21 0429  Weight: 84 kg    Examination:  General: chronically ill appearing, intubated Cardiac: RRR, no LE edema Pulm: intubated. Lung sounds clear GI: soft, non-distended Skin: no rash Neuro: off sedation since 2/26@ 1630. Attempts to open eyes at voice. Withdraws to pain x4. Does not follow commands. PERRL. No gaze deviation.   Assessment & Plan:   New onset seizures, status epilepticus resolved.  Continues to have intermittent seizure activity Acute encephalopathy of undetermined etiology.  DDX includes prolonged clearance of sedating agents vs post-ictal state vs infection Sedation off since 4:30PM 2/26; no change in mentation today NG on 2/23 blood cultures.  No organisms on CSF gram stain. CSF cytology largely unremarkable Plan - Continue full support on MV. Can do SBRT if mentation improves - Brain MRI today - Continue LTM - Continue acyclovir empirically. Follow up CSF cultures and HSV PCR - Antiepileptic management per neurology. Continue keppra. - Maintain neuroprotective measures, Seizure precautions.  Type 2 DM - CBGs remain above goal. 23U of insulin in past 24h.  - Start semglee 5U for basal coverage.  Increase SSI to resistant  High risk malnutrition - TF initiated 2/27. Continue current management, titrate up per protocol  HFrEF (25-30%). Euvolemic on exam. Monitor volume status.   Continue current management for the below chronic problems: COPD CAD s/p 3v CABG with AVR 09/24/21, Afib, HTN, HLD  Best Practice (right click and "Reselect all SmartList Selections" daily)   Diet/type: tubefeeds DVT prophylaxis:  SCD GI prophylaxis: PPI Lines: N/A Foley:  Yes, and it is still needed Code Status:  full code Last date of multidisciplinary goals of care discussion: family updated at bedside 2/28.   Mitzi Hansen, MD Internal Medicine Resident PGY-3 [facility] 11/19/2021 8:58 AM

## 2021-11-19 NOTE — Plan of Care (Signed)
°  Interdisciplinary Goals of Care Family Meeting   Date carried out:: 11/19/2021  Location of the meeting: Conference room  Member's involved: Physician and Family Member or next of kin  Durable Power of Attorney or Environmental health practitioner:   I spoke to the patient's daughters, son and family member who is clergy today about his overall prognosis.    Discussion: We discussed goals of care for Harry [name] .  I explained to them his condition is worsening today and his prognosis is uncertain.  As he had indicated to them in the past that he did not want prolonged aggressive care we spoke frankly about what they felt his wishes would be in this situation.  I explained to them that it is likely he will need days to weeks of ongoing ICU level care and we don't know his ultimate prognosis.  They said that they really would only want to continue aggressive care if we had a defined outcome that we felt we could reach with relative certainty.  We discussed the matter of CPR in the event of a cardiac arrest.  They want his code status changed to DNR.    They want to discuss his overall goals of care as a family and then speak with [place] again in the morning.  Specifically they are going to address how long they suspect he will remain on life support and whether or not they should withdraw care now.    We will maintain the current level of care until that conversation.   Code status: Full Code  Disposition: Continue current acute care  Time spent for the meeting: 30 minutes  Harry [name] 11/19/2021, 5:12 PM

## 2021-11-19 NOTE — Progress Notes (Signed)
An USGPIV (ultrasound guided PIV) has been placed on LPFA 22gx1.75"for short-term vasopressor infusion. A correctly placed ivWatch must be used when administering Vasopressors. Should this treatment be needed beyond 72 hours, central line access should be obtained.  It will be the responsibility of the bedside nurse to follow best practice to prevent extravasations.

## 2021-11-19 NOTE — Progress Notes (Signed)
RT NOTE: RT transported patient on ventilator from room 4N23 to MRI and back to room 4N23 with no complications. Vitals are stable. RT will continue to monitor.  

## 2021-11-19 NOTE — Progress Notes (Signed)
Per order,  Changed device settings for MRI to  VOO at 100 bpm  Will program device back to pre-MRI settings after completion of exam, and send transmission   

## 2021-11-20 DIAGNOSIS — N179 Acute kidney failure, unspecified: Secondary | ICD-10-CM | POA: Diagnosis not present

## 2021-11-20 DIAGNOSIS — N401 Enlarged prostate with lower urinary tract symptoms: Secondary | ICD-10-CM | POA: Diagnosis not present

## 2021-11-20 DIAGNOSIS — R402431 Glasgow coma scale score 3-8, in the field [EMT or ambulance]: Secondary | ICD-10-CM | POA: Diagnosis not present

## 2021-11-20 DIAGNOSIS — Z7901 Long term (current) use of anticoagulants: Secondary | ICD-10-CM

## 2021-11-20 DIAGNOSIS — R569 Unspecified convulsions: Secondary | ICD-10-CM | POA: Diagnosis not present

## 2021-11-20 DIAGNOSIS — G40901 Epilepsy, unspecified, not intractable, with status epilepticus: Secondary | ICD-10-CM | POA: Diagnosis not present

## 2021-11-20 DIAGNOSIS — G9340 Encephalopathy, unspecified: Secondary | ICD-10-CM | POA: Diagnosis not present

## 2021-11-20 LAB — CBC WITH DIFFERENTIAL/PLATELET
Abs Immature Granulocytes: 0.04 10*3/uL (ref 0.00–0.07)
Basophils Absolute: 0.1 10*3/uL (ref 0.0–0.1)
Basophils Relative: 1 %
Eosinophils Absolute: 0.8 10*3/uL — ABNORMAL HIGH (ref 0.0–0.5)
Eosinophils Relative: 9 %
HCT: 30.8 % — ABNORMAL LOW (ref 39.0–52.0)
Hemoglobin: 9.6 g/dL — ABNORMAL LOW (ref 13.0–17.0)
Immature Granulocytes: 0 %
Lymphocytes Relative: 30 %
Lymphs Abs: 2.7 10*3/uL (ref 0.7–4.0)
MCH: 25.3 pg — ABNORMAL LOW (ref 26.0–34.0)
MCHC: 31.2 g/dL (ref 30.0–36.0)
MCV: 81.1 fL (ref 80.0–100.0)
Monocytes Absolute: 0.8 10*3/uL (ref 0.1–1.0)
Monocytes Relative: 9 %
Neutro Abs: 4.7 10*3/uL (ref 1.7–7.7)
Neutrophils Relative %: 51 %
Platelets: 121 10*3/uL — ABNORMAL LOW (ref 150–400)
RBC: 3.8 MIL/uL — ABNORMAL LOW (ref 4.22–5.81)
RDW: 21.6 % — ABNORMAL HIGH (ref 11.5–15.5)
WBC: 9 10*3/uL (ref 4.0–10.5)
nRBC: 0 % (ref 0.0–0.2)

## 2021-11-20 LAB — COMPREHENSIVE METABOLIC PANEL
ALT: 12 U/L (ref 0–44)
AST: 16 U/L (ref 15–41)
Albumin: 2 g/dL — ABNORMAL LOW (ref 3.5–5.0)
Alkaline Phosphatase: 85 U/L (ref 38–126)
Anion gap: 8 (ref 5–15)
BUN: 13 mg/dL (ref 8–23)
CO2: 18 mmol/L — ABNORMAL LOW (ref 22–32)
Calcium: 7.6 mg/dL — ABNORMAL LOW (ref 8.9–10.3)
Chloride: 110 mmol/L (ref 98–111)
Creatinine, Ser: 0.85 mg/dL (ref 0.61–1.24)
GFR, Estimated: >60 mL/min (ref >60)
Glucose, Bld: 232 mg/dL — ABNORMAL HIGH (ref 70–99)
Potassium: 4.4 mmol/L (ref 3.5–5.1)
Sodium: 136 mmol/L (ref 135–145)
Total Bilirubin: 0.2 mg/dL — ABNORMAL LOW (ref 0.3–1.2)
Total Protein: 5.5 g/dL — ABNORMAL LOW (ref 6.5–8.1)

## 2021-11-20 LAB — BLOOD GAS, ARTERIAL
Acid-base deficit: 9.5 mmol/L — ABNORMAL HIGH (ref 0.0–2.0)
Bicarbonate: 13.3 mmol/L — ABNORMAL LOW (ref 20.0–28.0)
FIO2: 50 %
MECHVT: 450 mL
Mechanical Rate: 16
O2 Saturation: 99.9 %
PEEP: 5 cmH2O
Patient temperature: 37
pCO2 arterial: 22 mmHg — ABNORMAL LOW (ref 32–48)
pH, Arterial: 7.39 (ref 7.35–7.45)
pO2, Arterial: 143 mmHg — ABNORMAL HIGH (ref 83–108)

## 2021-11-20 LAB — TRIGLYCERIDES: Triglycerides: 172 mg/dL — ABNORMAL HIGH (ref <150)

## 2021-11-20 LAB — VALPROIC ACID LEVEL: Valproic Acid Lvl: 43 ug/mL — ABNORMAL LOW (ref 50.0–100.0)

## 2021-11-20 LAB — GLUCOSE, CAPILLARY
Glucose-Capillary: 188 mg/dL — ABNORMAL HIGH (ref 70–99)
Glucose-Capillary: 227 mg/dL — ABNORMAL HIGH (ref 70–99)
Glucose-Capillary: 229 mg/dL — ABNORMAL HIGH (ref 70–99)
Glucose-Capillary: 233 mg/dL — ABNORMAL HIGH (ref 70–99)
Glucose-Capillary: 243 mg/dL — ABNORMAL HIGH (ref 70–99)
Glucose-Capillary: 281 mg/dL — ABNORMAL HIGH (ref 70–99)

## 2021-11-20 LAB — HSV 1/2 PCR, CSF
HSV-1 DNA: NEGATIVE
HSV-2 DNA: NEGATIVE

## 2021-11-20 LAB — PHOSPHORUS: Phosphorus: 2.9 mg/dL (ref 2.5–4.6)

## 2021-11-20 LAB — MAGNESIUM: Magnesium: 2.1 mg/dL (ref 1.7–2.4)

## 2021-11-20 MED ORDER — INSULIN GLARGINE-YFGN 100 UNIT/ML ~~LOC~~ SOLN
8.0000 [IU] | Freq: Every day | SUBCUTANEOUS | Status: DC
  Administered 2021-11-20: 8 [IU] via SUBCUTANEOUS
  Filled 2021-11-20 (×2): qty 0.08

## 2021-11-20 MED ORDER — INSULIN GLARGINE-YFGN 100 UNIT/ML ~~LOC~~ SOLN
3.0000 [IU] | Freq: Once | SUBCUTANEOUS | Status: AC
  Administered 2021-11-20: 3 [IU] via SUBCUTANEOUS
  Filled 2021-11-20: qty 0.03

## 2021-11-20 MED ORDER — APIXABAN 5 MG PO TABS
5.0000 mg | ORAL_TABLET | Freq: Two times a day (BID) | ORAL | Status: DC
  Administered 2021-11-20 – 2021-11-22 (×5): 5 mg
  Filled 2021-11-20 (×5): qty 1

## 2021-11-20 NOTE — Procedures (Addendum)
Patient Name: KAZMIR OKI  ?MRN: [id number]  ?Epilepsy Attending: Charlsie Quest  ?Referring Physician/Provider: Rejeana Brock, MD ?Duration: 11/19/2021 1604 to 11/20/2021 1604 ?  ?Patient history: 81yo M with ams, eeg showed status epilepticus. Long term eeg for monitoring of seizure. ?  ?Level of alertness: comatose ?  ?AEDs during EEG study: LEV, propofol ?  ?Technical aspects: This EEG study was done with scalp electrodes positioned according to the 10-20 International system of electrode placement. Electrical activity was acquired at a sampling rate of 500Hz  and reviewed with a high frequency filter of 70Hz  and a low frequency filter of 1Hz . EEG data were recorded continuously and digitally stored.  ?  ?Description: EEG showed continuous generalized 3 to 6 Hz theta-delta slowing admixed with intermittent frontocentral 15 to 16 Hz beta activity. ?Hyperventilation and photic stimulation were not performed.    ?   ?ABNORMALITY ?- Continuous slow, generalized ?  ?IMPRESSION: ?This study suggestive of severe diffuse encephalopathy, nonspecific etiology but likely related to sedation.  No definite seizures or epileptiform discharges were seen during the study. ?  ?  ?

## 2021-11-20 NOTE — Progress Notes (Signed)
? ?NAME:  Harry [name], MRN:  [id number], DOB:  08/21/1941, LOS: 4 ?ADMISSION DATE:  11/08/2021, CONSULTATION DATE:  11/10/2021 ?REFERRING MD:  [facility], CHIEF COMPLAINT:  Altered mental status  ? ?History of Present Illness:  ?81 y/o male brought to [facility] ER after being found by his wife unresponsive with his head lying off the bed.  He was making gurgling noises.  CBG 80, given D10, no improvement.  Intubated in ER for airway protection.  Neuro consulted.  CT head negative.  EEG showed seizure.  Unable to do LP initial since on DOAC.  Started on keppra, ABX, and acyclovir.  Transferred to [facility] for LTM. ? ?Pertinent  Medical History  ?Atrial Fib (on DOAC), CAD, AS, s/p 3V CABG x3 & AVR (2023), COPD, HTN, HL, B12 def, BPH, DDD, Diabetes (on insulin), diabetic retinopathy, Obesity, OSA, chronic stroke ? ?Significant Hospital Events: ?Including procedures, antibiotic start and stop dates in addition to other pertinent events   ?2/25 admitted after being found unresponsive by wife. No nuchal rigidity Intubated for airway protection. Initial CT of head/ CT angiogram: both w/ no acute findings. EEG showed active seizure. Lactic acid 7.2. wbc 14.4,. Cultures sent. LP not able to be done due to home DOAC. Transferred to [facility] for LTM w/ status epilepticus concern for possible CNS infection: stated on: acyclovir, cefepime and vanc .  ?2/27 Still having intermittent seizure activity on EEG; no significant improvement in mentation; IR LP largely unremarkable; MRI delayed until 2/28 ?2/28 No change in mentation overnight. Family meeting with change to DNR. CSF largely unremarkable. ? ?Interim History / Subjective:  ?Afebrile  ?CSF culture pending, ngtd  ?Vent - PEEP 5, fiO2 30% ?I/O 1.4L UOP, +4.4L in last 24 hours  ?Remains on continuous EEG  ?Family at bedside  ? ?Objective   ?Blood pressure 124/66, pulse 93, temperature 97.7 ?F (36.5 ?C), temperature source Axillary, resp. rate 19, height 5\' 7"  (1.702 m), weight 84 kg, SpO2 95  %. ?   ?Vent Mode: PRVC ?FiO2 (%):  [30 %] 30 % ?Set Rate:  [16 bmp] 16 bmp ?Vt Set:  [540 mL] 540 mL ?PEEP:  [5 cmH20] 5 cmH20 ?Plateau Pressure:  [12 [id number] cmH20] 18 cmH20  ? ?Intake/Output Summary (Last 24 hours) at 11/20/2021 1015 ?Last data filed at 11/20/2021 [id number] ?Gross per 24 hour  ?Intake 4395.29 ml  ?Output 1525 ml  ?Net 2870.29 ml  ? ?Filed Weights  ? 11/17/21 0429  ?Weight: 84 kg  ? ? ?Examination: ?General: critically ill appearing adult male lying in bed in NAD ?HEENT: MM pink/moist, ETT, pupils equal / reactive, EEG leads in place  ?Neuro: sedate on propofol  ?CV: s1s2 RRR, no m/r/g, healed sternal scar ?PULM: non-labored at rest, lungs bilaterally clear  ?GI: soft, bsx4 active  ?Extremities: warm/dry, trace dependent edema, healing graft sites on LE's ?Skin: no rashes or lesions ? ?Assessment & Plan:  ? 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  S/p LP with no clear evidence for infection.  Sedation previously interrupted but seizure returned.  ?-continue EEG monitoring, await 3/1 read per Neurology  ?-continue acyclovir until HSV PCR returned  ?-continue keppra, valproate per Neurology  ?-propofol for suppression  ? ?Acute Respiratory Failure in setting of Status Epilepticus (POA) ?COPD w/o exacerbation  ?-PRVC 8cc/kg  ?-wean PEEP / FiO2 for sats >90% ?-follow intermittent CXR ?-sedation / suppression of seizure preclude SBT / WUA  ?-duoneb PRN  ? ?  Hypotension  ?Suspect sedation related  ?-continue low dose levophed for MAP >65  ?-IVF at 40 ml/hr ? ?Type 2 DM with Hyperglycemia  ?-SSI with resistant scale  ?-increase long acting to 8 units QD  ?-additional 3 units semglee now  ? ?Moderate Protein Calorie Malnutrition ?-continue TF  ? ?HFrEF (25-30%) ?Hx CAD s/p 3V CABG with AVR 09/24/21, AF on anticoagulation, HTN, HLD  ?-follow I/O's  ?-reduce IVF to 67ml/hr ?-goal euvolemia  ?-continue tele monitoring  ?-eliquis,  pravastatin PT  ? ?GOC Discussion  ?-see below discussion  ?-DNR as of 2/28 ? ?Best Practice (right click and "Reselect all SmartList Selections" daily)  ?Diet/type: tubefeeds ?DVT prophylaxis: SCD, eliquis  ?GI prophylaxis: PPI ?Lines: N/A ?Foley:  Yes, and it is still needed ?Code Status:  full code ?Last date of multidisciplinary goals of care discussion: family updated at bedside 3/1.  Extensive discussion (greater than 30 minutes) regarding details of admission.  They are hopeful for recovery but are firm on the idea he would not want to live in a prolonged state of illness similar to what he is experiencing.  They are anxious to hear back about EEG findings from last 24 hours and HSV PCR.  They are also concerned that he is comfortable throughout this process.  May transition to comfort pending findings of EEG etc.  Support offered to family. Wife, and patients daughter, son & brother at bedside.  ? ?Critical Care Time: 37 minutes  ? ?Noe Gens, MSN, APRN, NP-C, AGACNP-BC ?[facility] ?11/20/2021, 10:15 AM ? ? ?Please see [email or web address] for pager details.  ? ?From 7A-7P if no response, please call [phone] ?After hours, please call Warren Lacy [phone] ? ? ? ?

## 2021-11-20 NOTE — Progress Notes (Signed)
Neurology Progress Note  Brief HPI: 81 y.o. male who was found by his wife on the morning of admission with his top half hanging off of the bed with his eyes open but would not fixate or track his wife and was with incomprehensible sounds. Patient's GCS remained poor on arrival to [facility] and he was intubated for airway protection. EEG was performed at [facility] with frequent seizures on EEG in the left frontal region concerning for partial status epilepticus. He was loaded with 4 g of Keppra and started on propofol. Patient was transferred to [facility] for continuous EEG monitoring and further work up and management.   Subjective: EEG worsened yesterday with 6-7 per hour in the afternoon. No further seizures after he was started on propofol. MRI-brain showed several small infarcts in the corona radiata. Eliquis resumed now that LP is completed. Patient's poor prognosis discussed with family. Family reports they are discussing potential comfort care measures with Dr. Lake Bells. Explained rationale for using propofol and potential wean if patient is seizure-free for 24 hrs.    Objective: Vitals:   11/20/21 0900 11/20/21 1039  BP: 124/66 121/67  Pulse: 93 92  Resp: 19 (!) 21  Temp:    SpO2: 95% 95%   Gen: Intubated in ICU, sedated with propofol Resp: respirations assisted via mechanical ventilator Abd: soft, non-distended  Neuro:  Mental Status: patient is intubated and sedated in the ICU.  He does not follow commands or respond to painful stimulus. Cranial Nerves: pupils barely reactive, negative oculocephalic and corneal reflex, cough reflex but no gag. Motor and Sensory:  Unable to test motor and sensation given mental status Reflexes 0 bilaterally in UE and LE Plantars: no plantar reflex Gait: NA  Pertinent Labs: CBC    Component Value Date/Time   WBC 9.0 11/20/2021 0754   RBC 3.80 (L) 11/20/2021 0754   HGB 9.6 (L) 11/20/2021 0754   HGB 15.1 01/29/2012 0030   HCT 30.8 (L) 11/20/2021  0754   HCT 46.0 01/29/2012 0030   PLT 121 (L) 11/20/2021 0754   PLT 201 01/29/2012 0030   MCV 81.1 11/20/2021 0754   MCV 87 01/29/2012 0030   MCH 25.3 (L) 11/20/2021 0754   MCHC 31.2 11/20/2021 0754   RDW 21.6 (H) 11/20/2021 0754   RDW 14.5 01/29/2012 0030   LYMPHSABS 2.7 11/20/2021 0754   MONOABS 0.8 11/20/2021 0754   EOSABS 0.8 (H) 11/20/2021 0754   BASOSABS 0.1 11/20/2021 0754   CMP     Component Value Date/Time   NA 136 11/20/2021 0754   NA 139 01/29/2012 0030   K 4.4 11/20/2021 0754   K 4.7 01/29/2012 0030   CL 110 11/20/2021 0754   CL 104 01/29/2012 0030   CO2 18 (L) 11/20/2021 0754   CO2 25 01/29/2012 0030   GLUCOSE 232 (H) 11/20/2021 0754   GLUCOSE 209 (H) 01/29/2012 0030   BUN 13 11/20/2021 0754   BUN 13 01/29/2012 0030   CREATININE 0.85 11/20/2021 0754   CREATININE 0.81 01/29/2012 0030   CALCIUM 7.6 (L) 11/20/2021 0754   CALCIUM 9.2 01/29/2012 0030   PROT 5.5 (L) 11/20/2021 0754   ALBUMIN 2.0 (L) 11/20/2021 0754   AST 16 11/20/2021 0754   ALT 12 11/20/2021 0754   ALKPHOS 85 11/20/2021 0754   BILITOT 0.2 (L) 11/20/2021 0754   GFRNONAA >60 11/20/2021 0754   GFRNONAA >60 01/29/2012 0030   GFRAA >60 09/03/2016 1625   GFRAA >60 01/29/2012 0030   Lactic Acid, Venous  Component Value Date/Time   LATICACIDVEN 1.5 11/17/2021 0158    Latest Reference Range & Units 11/06/2021 11:57  Influenza A By PCR NEGATIVE  NEGATIVE  Influenza B By PCR NEGATIVE  NEGATIVE  SARS Coronavirus 2 by RT PCR NEGATIVE  NEGATIVE   Ammonia 2/25: 10  Urinalysis    Component Value Date/Time   COLORURINE YELLOW (A) 11/14/2021 1006   APPEARANCEUR HAZY (A) 11/12/2021 1006   APPEARANCEUR Clear 06/12/2021 0856   LABSPEC 1.020 11/03/2021 1006   LABSPEC 1.010 01/29/2012 1600   PHURINE 5.0 11/06/2021 1006   GLUCOSEU NEGATIVE 11/07/2021 1006   GLUCOSEU 50 mg/dL 01/29/2012 1600   HGBUR NEGATIVE 11/01/2021 1006   BILIRUBINUR NEGATIVE 11/11/2021 1006   BILIRUBINUR Negative 06/12/2021  0856   BILIRUBINUR Negative 01/29/2012 1600   KETONESUR NEGATIVE 11/11/2021 1006   PROTEINUR 30 (A) 11/02/2021 1006   NITRITE NEGATIVE 11/09/2021 1006   LEUKOCYTESUR NEGATIVE 11/17/2021 1006   LEUKOCYTESUR Negative 01/29/2012 1600   Lab Results  Component Value Date   TSH 1.110 10/23/2021   Imaging Reviewed:  [facility] 2/25: No acute intracranial abnormality. 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  CTA head and neck 2/25: No significant change since prior study. No large vessel occlusion, hemodynamically significant stenosis, or evidence of dissection.  Overnight EEG 2/26-2/27 "This study showed generalized periodic discharges with triphasic morphology with fluctuating frequency of 1 to 2.5 Hz, seen predominantly when patient was stimulated.  This EEG pattern is suggestive of epileptogenicity with generalized onset and is on the ictal-interictal continuum with intermediate potential for seizure recurrence."  MRI-Brain 2/28 Small acute/subacute infarcts in the corona radiata, remote lacunar infarcts in caudate heads.  cEEG 11/20/2021 EEG showed continuous generalized 3 to 6 Hz theta-delta slowing admixed with intermittent frontocentral 15 to 16 Hz beta activity. Hyperventilation and photic stimulation were not performed.  Assessment: 81 yo male with acute onset coma. EEG revealed partial status epilepticus. Transferred from [facility] for LTM EEG.  - DDx epileptic: Partial status epilepticus is the most likely etiology for his presentation.  - DDx vascular: No evidence of basilar occlusion on angiographic imaging. Embolic shower without LVO would be another consideration, will obtain MRI brain when patient is stabilized. - DDx infectious: No meningismus to suggest meningitis. Septic encephalopathy is also possible. CSF cell count with differential (Tube 3): Clear, 1 RBC, 2 WBC. CSF Glucose 108, Protein 39 - Other DDx: Another consideration would be prolonged hypotension from  afib/RVR that spontaneously converted. This is felt to be the least likely etiology for his presentation.  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 There is some concern whether patient still undergoing seizures. EEG difficult to interpret but appears to show 6-7 seizures per hour.  - LP not particularly concerning for meningitis with no pleocytosis, no elevated protein. Will continue Acyclovir while waiting on HSV PCR.  Recommendations: - Continue LTM EEG - Continue propofol for seizure control, will reassess this afternoon based on EEG results - Keppra 1500 mg BID - Depakote 500 mg q8hrs - continue Acylcovir while HSV PCR is pending.   Corky Sox, MD PGY-1  NEUROHOSPITALIST ADDENDUM Performed a face to face diagnostic evaluation.   I have reviewed the contents of history and physical exam as documented by PA/ARNP/Resident and agree with above documentation.  I have discussed and formulated the above plan as documented. Edits to the note have  been made as needed.  Impression/Key exam findings/Plan: Still no clear reason for why he started having seizure flurry out of nowhere. This is the first where he has had seizures. Yesterday cEEG worsened despite Keppra and Depakote. We started on propofol 103mcg/Kg/min and no seizures since.  I spoke to family about him and discussed that he is in a tenuous state. I think the plan we reached is that since his seizures are controlled for now, we will stay the course. We will continue propofol overnight at 10mcg/kg/min and if no further seizures, we will gradually start weaning propofol off starting tomorrow. Once off propofol, we will see how his mentation does over the next few days. If he becomes more responsive, then would continue the course but if things take a turn for the worse, we  will revisit goal of care discussion. I also discussed with them that even if we are able to control his seizures, recovery is going to be prolonged and protracted over several weeks to months. The fact that he was very deconditioned and been more withdrawn since last December, makes him likely to have prolonged delirium.  This patient is critically ill and at significant risk of neurological worsening, death and care requires constant monitoring of vital signs, hemodynamics,respiratory and cardiac monitoring, neurological assessment, discussion with family, other specialists and medical decision making of high complexity. I spent 90 minutes of neurocritical care time  in the care of  this patient. This was time spent independent of any time provided by nurse practitioner or PA.  Donnetta Simpers [facility] Pager Number [phone] 11/20/2021  4:31 PM   Donnetta Simpers, MD [facility] [phone]   If 7pm to 7am, please call on call as listed on AMION.

## 2021-11-20 NOTE — Progress Notes (Signed)
LTM EEG maint complete.  °

## 2021-11-20 DEATH — deceased

## 2021-11-21 DIAGNOSIS — N179 Acute kidney failure, unspecified: Secondary | ICD-10-CM | POA: Diagnosis not present

## 2021-11-21 DIAGNOSIS — N401 Enlarged prostate with lower urinary tract symptoms: Secondary | ICD-10-CM | POA: Diagnosis not present

## 2021-11-21 DIAGNOSIS — R402431 Glasgow coma scale score 3-8, in the field [EMT or ambulance]: Secondary | ICD-10-CM | POA: Diagnosis not present

## 2021-11-21 DIAGNOSIS — G9340 Encephalopathy, unspecified: Secondary | ICD-10-CM | POA: Diagnosis not present

## 2021-11-21 DIAGNOSIS — G40901 Epilepsy, unspecified, not intractable, with status epilepticus: Secondary | ICD-10-CM | POA: Diagnosis not present

## 2021-11-21 DIAGNOSIS — R569 Unspecified convulsions: Secondary | ICD-10-CM | POA: Diagnosis not present

## 2021-11-21 LAB — BASIC METABOLIC PANEL WITH GFR
Anion gap: 7 (ref 5–15)
BUN: 15 mg/dL (ref 8–23)
CO2: 21 mmol/L — ABNORMAL LOW (ref 22–32)
Calcium: 7.8 mg/dL — ABNORMAL LOW (ref 8.9–10.3)
Chloride: 107 mmol/L (ref 98–111)
Creatinine, Ser: 1 mg/dL (ref 0.61–1.24)
GFR, Estimated: >60 mL/min
Glucose, Bld: 254 mg/dL — ABNORMAL HIGH (ref 70–99)
Potassium: 4.7 mmol/L (ref 3.5–5.1)
Sodium: 135 mmol/L (ref 135–145)

## 2021-11-21 LAB — GLUCOSE, CAPILLARY
Glucose-Capillary: 230 mg/dL — ABNORMAL HIGH (ref 70–99)
Glucose-Capillary: 269 mg/dL — ABNORMAL HIGH (ref 70–99)
Glucose-Capillary: 303 mg/dL — ABNORMAL HIGH (ref 70–99)
Glucose-Capillary: 301 mg/dL — ABNORMAL HIGH (ref 70–99)
Glucose-Capillary: 293 mg/dL — ABNORMAL HIGH (ref 70–99)
Glucose-Capillary: 269 mg/dL — ABNORMAL HIGH (ref 70–99)

## 2021-11-21 LAB — CULTURE, BLOOD (ROUTINE X 2)
Culture: NO GROWTH
Culture: NO GROWTH
Culture: NO GROWTH
Culture: NO GROWTH

## 2021-11-21 LAB — CBC
HCT: 31 % — ABNORMAL LOW (ref 39.0–52.0)
Hemoglobin: 9.9 g/dL — ABNORMAL LOW (ref 13.0–17.0)
MCH: 25.8 pg — ABNORMAL LOW (ref 26.0–34.0)
MCHC: 31.9 g/dL (ref 30.0–36.0)
MCV: 80.9 fL (ref 80.0–100.0)
Platelets: 129 10*3/uL — ABNORMAL LOW (ref 150–400)
RBC: 3.83 MIL/uL — ABNORMAL LOW (ref 4.22–5.81)
RDW: 21.6 % — ABNORMAL HIGH (ref 11.5–15.5)
WBC: 10.7 10*3/uL — ABNORMAL HIGH (ref 4.0–10.5)
nRBC: 0 % (ref 0.0–0.2)

## 2021-11-21 MED ORDER — INSULIN GLARGINE-YFGN 100 UNIT/ML ~~LOC~~ SOLN
20.0000 [IU] | Freq: Every day | SUBCUTANEOUS | Status: DC
  Administered 2021-11-21: 20 [IU] via SUBCUTANEOUS
  Filled 2021-11-21 (×2): qty 0.2

## 2021-11-21 MED ORDER — FUROSEMIDE 10 MG/ML IJ SOLN
40.0000 mg | Freq: Once | INTRAMUSCULAR | Status: AC
  Administered 2021-11-21: 40 mg via INTRAVENOUS
  Filled 2021-11-21: qty 4

## 2021-11-21 MED ORDER — INSULIN GLARGINE-YFGN 100 UNIT/ML ~~LOC~~ SOLN
8.0000 [IU] | Freq: Once | SUBCUTANEOUS | Status: AC
  Administered 2021-11-21: 8 [IU] via SUBCUTANEOUS
  Filled 2021-11-21: qty 0.08

## 2021-11-21 NOTE — Progress Notes (Signed)
Received order to titrate propofol down  by 5 every hr to goal of off. Order updated in Epic and wean started. ?

## 2021-11-21 NOTE — Progress Notes (Addendum)
LTM maint complete New skin breakdown F3 Old skin breakdown Fp1 Fp2 Ref F7 F8 F4 ?[facility] monitored, Event button test confirmed by [facility]. ? ? ?

## 2021-11-21 NOTE — Progress Notes (Signed)
? ?NAME:  Harry [name], MRN:  [id number], DOB:  26-Nov-1940, LOS: 5 ?ADMISSION DATE:  10/30/2021, CONSULTATION DATE:  10/30/2021 ?REFERRING MD:  [facility], CHIEF COMPLAINT:  Altered mental status  ? ?History of Present Illness:  ?81 y/o male brought to [facility] ER after being found by his wife unresponsive with his head lying off the bed.  He was making gurgling noises.  CBG 80, given D10, no improvement.  Intubated in ER for airway protection.  Neuro consulted.  CT head negative.  EEG showed seizure.  Unable to do LP initial since on DOAC.  Started on keppra, ABX, and acyclovir.  Transferred to [facility] for LTM. ? ?Pertinent  Medical History  ?Atrial Fib (on DOAC), CAD, AS, s/p 3V CABG x3 & AVR (2023), COPD, HTN, HL, B12 def, BPH, DDD, Diabetes (on insulin), diabetic retinopathy, Obesity, OSA, chronic stroke ? ?Significant Hospital Events: ?Including procedures, antibiotic start and stop dates in addition to other pertinent events   ?2/25 admitted after being found unresponsive by wife. No nuchal rigidity Intubated for airway protection. Initial CT of head/ CT angiogram: both w/ no acute findings. EEG showed active seizure. Lactic acid 7.2. wbc 14.4,. Cultures sent. LP not able to be done due to home DOAC. Transferred to [facility] for LTM w/ status epilepticus concern for possible CNS infection: stated on: acyclovir, cefepime and vanc .  ?2/27 Still having intermittent seizure activity on EEG; no significant improvement in mentation; IR LP largely unremarkable; MRI delayed until 2/28 ?2/28 No change in mentation overnight. Family meeting with change to DNR. CSF largely unremarkable. ?3/1 LTM EEG, on propofol, no seizures.  CSF culture pending, NGTD ? ?Interim History / Subjective:  ?Afebrile  ?No seizure on LTM EEG  ?Remains on propofol  ?Glucose 230-269, used 43 units in last 24 hours of short acting coverage  ?I/O 1L UOP, +3.2L in last 24 hours  ? ?Objective   ?Blood pressure 108/68, pulse 88, temperature 97.8 ?F (36.6 ?C),  temperature source Axillary, resp. rate (!) 21, height 5\' 7"  (1.702 m), weight 97.9 kg, SpO2 97 %. ?   ?Vent Mode: PRVC ?FiO2 (%):  [30 %] 30 % ?Set Rate:  [16 bmp] 16 bmp ?Vt Set:  [540 mL] 540 mL ?PEEP:  [5 cmH20] 5 cmH20 ?Plateau Pressure:  [15 [id number] cmH20] 17 cmH20  ? ?Intake/Output Summary (Last 24 hours) at 11/21/2021 0930 ?Last data filed at 11/21/2021 0700 ?Gross per 24 hour  ?Intake 3740.65 ml  ?Output 1045 ml  ?Net 2695.65 ml  ? ?Filed Weights  ? 11/17/21 0429 11/21/21 0500  ?Weight: 84 kg 97.9 kg  ? ? ?Examination: ?General: critically ill appearing elderly male lying in bed on vent in NAD    ?HEENT: MM pink/moist, ETT, pupils equal / reactive, EEG leads in place, anicteric  ?Neuro: sedate on propofol  ?CV: s1s2 RRR, paced rhythm noted, no m/r/g ?PULM: non-labored at rest, clear on right, diminished but clear on left ?GI: soft, bsx4 active  ?Extremities: warm/dry, generalized 1+ edema  ?Skin: healing sternal and BLE wounds from recent CABG, well approximated ? ?Assessment & Plan:  ? 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  S/p LP with no clear evidence for infection.  Sedation previously interrupted but seizure returned. HSV negative.  ?-no evidence of seizure on propofol, will begin slow wean of 5mg /hr propofol  ?-appreciate Neurology assistance with patient care  ?-stop acyclovir  ?-continue keppra, valproate, AED's per Neurology  ?-  continue propofol with wean as above for sedation  ?-monitor for seizure activity  ? ?Acute Respiratory Failure in setting of Status Epilepticus (POA) ?COPD w/o exacerbation  ?-PRVC 8cc/kg as rest mode  ?-wean PEEP/fiO2 for sats >90% ?-assess CXR in am  ?-sedation precludes WUA / SBT  ?-duoneb PRN  ? ?Hypotension  ?Suspect sedation related  ?-wean levophed for MAP >65  ?-reduce IVF to [facility]  ?-lasix 40 mg x1 with propofol wean, anticipate pressure will tolerate with lightening of sedation    ? ?Type 2 DM with Hyperglycemia  ?-SSI, resistant scale  ?-increase glargine to 20 units QHS  ?-add additional 8 units glargine now  ? ?Moderate Protein Calorie Malnutrition ?-TF per Nutrition  ? ?HFrEF (25-30%) ?Hx CAD s/p 3V CABG with AVR 09/24/21, AF on anticoagulation, HTN, HLD  ?-significantly positive balance, lasix as above ?-IVF to [facility]  ?-tele monitoring  ?-continue pravastatin, eliquis ? ?[place] Discussion  ?-see below discussion  ?-DNR as of 2/28 ? ?Best Practice (right click and "Reselect all SmartList Selections" daily)  ?Diet/type: tubefeeds ?DVT prophylaxis: SCD, eliquis  ?GI prophylaxis: PPI ?Lines: N/A ?Foley:  Yes, and it is still needed ?Code Status:  full code ?Last date of multidisciplinary goals of care discussion: daughter updated 3/2 at bedside on plan of care.  Plan is to continue to wean propofol and reassess mental status.  Pending clinical course, they may want to transition to comfort measures if one of decline or no recovery.  ? ?Critical Care Time: 34 minutes  ? ?Noe Gens, MSN, APRN, NP-C, AGACNP-BC ?[facility] ?11/21/2021, 9:30 AM ? ? ?Please see [email or web address] for pager details.  ? ?From 7A-7P if no response, please call [phone] ?After hours, please call Warren Lacy [phone] ? ? ? ?

## 2021-11-21 NOTE — Procedures (Addendum)
Patient Name: Harry [name]  ?MRN: [id number]  ?Epilepsy Attending: Charlsie Quest  ?Referring Physician/Provider: Rejeana Brock, MD ?Duration: 11/20/2021 1604 to 11/21/2021 1604 ?  ?Patient history: 81yo M with ams, eeg showed status epilepticus. Long term eeg for monitoring of seizure. ?  ?Level of alertness: comatose ?  ?AEDs during EEG study: LEV, VPA, propofol ?  ?Technical aspects: This EEG study was done with scalp electrodes positioned according to the 10-20 International system of electrode placement. Electrical activity was acquired at a sampling rate of 500Hz  and reviewed with a high frequency filter of 70Hz  and a low frequency filter of 1Hz . EEG data were recorded continuously and digitally stored.  ?  ?Description: EEG showed continuous generalized 3 to 6 Hz theta-delta slowing admixed with intermittent frontocentral 15 to 16 Hz beta activity. ?Hyperventilation and photic stimulation were not performed.    ?   ?ABNORMALITY ?- Continuous slow, generalized ?  ?IMPRESSION: ?This study suggestive of severe diffuse encephalopathy, nonspecific etiology but likely related to sedation.  No definite seizures or epileptiform discharges were seen during the study. ?  ?  ?

## 2021-11-21 NOTE — Progress Notes (Signed)
Pt coughing, tachypneic, gagging on tube, vent alarming. Fentanyl 50 mcg given.   ? ?Pt looking comfortable after med. Resp rate 20, Peak pressures down, no longer gagging. ?

## 2021-11-21 NOTE — Progress Notes (Signed)
Neurology Progress Note ? ?Brief HPI: 81 y.o. male who was found by his wife on the morning of admission with his top half hanging off of the bed with his eyes open but would not fixate or track his wife and was with incomprehensible sounds. Patient's GCS remained poor on arrival to [facility] and he was intubated for airway protection. 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 He was loaded with 4 g of Keppra and started on propofol. Patient was transferred to [facility] for continuous EEG monitoring and further work up and management.  ? ?Subjective: ?EEG without evidence of seizure for approximately 24 hrs. Discussed with family plan to wean propofol by 5 mcg every hour and monitor response.  ? ? ?Objective: ?Vitals:  ? 11/21/21 1030 11/21/21 1107  ?BP: 127/72 124/74  ?Pulse: 90 91  ?Resp: (!) 22 (!) 22  ?Temp:    ?SpO2: 98% 98%  ? ?Gen: Intubated in ICU, sedated with propofol ?Resp: respirations assisted via mechanical ventilator ?Abd: soft, non-distended ? ?Neuro:  ?Mental Status: patient is intubated and sedated in the ICU.  ?He does not follow commands or respond to painful stimulus. ?Cranial Nerves: pupils barely reactive, negative oculocephalic and corneal reflex, no gag or cough today ?Motor and Sensory:  ?Unable to test motor and sensation given mental status ?Reflexes 0 bilaterally in UE and LE ?Plantars: no plantar reflex ?Gait: NA ? ?Pertinent Labs: ?CBC ?   ?Component Value Date/Time  ? WBC 10.7 (H) 11/21/2021 0513  ? RBC 3.83 (L) 11/21/2021 0513  ? HGB 9.9 (L) 11/21/2021 0513  ? HGB 15.1 01/29/2012 0030  ? HCT 31.0 (L) 11/21/2021 0513  ? HCT 46.0 01/29/2012 0030  ? PLT 129 (L) 11/21/2021 0513  ? PLT 201 01/29/2012 0030  ? MCV 80.9 11/21/2021 0513  ? MCV 87 01/29/2012 0030  ? MCH 25.8 (L) 11/21/2021 0513  ? MCHC 31.9 11/21/2021 0513  ? RDW 21.6 (H) 11/21/2021 0513  ? RDW 14.5 01/29/2012 0030  ? LYMPHSABS 2.7 11/20/2021 0754   ? MONOABS 0.8 11/20/2021 0754  ? EOSABS 0.8 (H) 11/20/2021 0754  ? BASOSABS 0.1 11/20/2021 0754  ? ?CMP  ?   ?Component Value Date/Time  ? NA 135 11/21/2021 0513  ? NA 139 01/29/2012 0030  ? K 4.7 11/21/2021 0513  ? K 4.7 01/29/2012 0030  ? CL 107 11/21/2021 0513  ? CL 104 01/29/2012 0030  ? CO2 21 (L) 11/21/2021 0513  ? CO2 25 01/29/2012 0030  ? GLUCOSE 254 (H) 11/21/2021 0513  ? GLUCOSE 209 (H) 01/29/2012 0030  ? BUN 15 11/21/2021 0513  ? BUN 13 01/29/2012 0030  ? CREATININE 1.00 11/21/2021 0513  ? CREATININE 0.81 01/29/2012 0030  ? CALCIUM 7.8 (L) 11/21/2021 0513  ? CALCIUM 9.2 01/29/2012 0030  ? PROT 5.5 (L) 11/20/2021 0754  ? ALBUMIN 2.0 (L) 11/20/2021 0754  ? AST 16 11/20/2021 0754  ? ALT 12 11/20/2021 0754  ? ALKPHOS 85 11/20/2021 0754  ? BILITOT 0.2 (L) 11/20/2021 0754  ? GFRNONAA >60 11/21/2021 0513  ? GFRNONAA >60 01/29/2012 0030  ? GFRAA >60 09/03/2016 1625  ? GFRAA >60 01/29/2012 0030  ? ?Lactic Acid, Venous ?   ?Component Value Date/Time  ? LATICACIDVEN 1.5 11/17/2021 0158  ? ? Latest Reference Range & Units 11/14/2021 11:57  ?Influenza A By PCR NEGATIVE  NEGATIVE  ?Influenza B By PCR NEGATIVE  NEGATIVE  ?SARS Coronavirus 2 by  RT PCR NEGATIVE  NEGATIVE  ? ?[place] 2/25: 10 ? ?Urinalysis ?   ?Component Value Date/Time  ? COLORURINE YELLOW (A) 11/19/2021 1006  ? APPEARANCEUR HAZY (A) 10/24/2021 1006  ? APPEARANCEUR Clear 06/12/2021 0856  ? LABSPEC 1.020 10/23/2021 1006  ? LABSPEC 1.010 01/29/2012 1600  ? PHURINE 5.0 11/10/2021 1006  ? [place] NEGATIVE 10/31/2021 1006  ? GLUCOSEU 50 mg/dL 01/29/2012 1600  ? [place] NEGATIVE 11/01/2021 1006  ? [place] NEGATIVE 11/01/2021 1006  ? BILIRUBINUR Negative 06/12/2021 0856  ? BILIRUBINUR Negative 01/29/2012 1600  ? [place] NEGATIVE 11/15/2021 1006  ? PROTEINUR 30 (A) 11/10/2021 1006  ? NITRITE NEGATIVE 11/18/2021 1006  ? LEUKOCYTESUR NEGATIVE 11/13/2021 1006  ? LEUKOCYTESUR Negative 01/29/2012 1600  ? ?Lab Results  ?Component Value Date  ? TSH 1.110 11/14/2021   ? ?Imaging Reviewed: ? ?[place] 2/25: ?No acute intracranial abnormality. ?Stable cerebral atrophy and chronic small vessel disease. ? ?CTA head and neck 2/25: ?No significant change since prior study. No large vessel occlusion, hemodynamically significant stenosis, or evidence of dissection. ? ?Overnight EEG 2/26-2/27 ?"This study showed generalized periodic discharges with triphasic morphology with fluctuating frequency of 1 to 2.5 Hz, seen predominantly when patient was stimulated.  This EEG pattern is suggestive of epileptogenicity with generalized onset and is on the ictal-interictal continuum with intermediate potential for seizure recurrence." ? ?MRI-Brain 2/28 ?Small acute/subacute infarcts in the corona radiata, remote lacunar infarcts in caudate heads. ? ?cEEG 11/21/2021 ?EEG showed continuous generalized 3 to 6 Hz theta-delta slowing admixed with intermittent frontocentral 15 to 16 Hz beta activity. ?Hyperventilation and photic stimulation were not performed. ? ?Assessment: 81 yo male with acute onset coma. EEG revealed partial status epilepticus. Transferred from [facility] for LTM EEG.  ?- DDx epileptic: Partial status epilepticus is the most likely etiology for his presentation.  ?- DDx vascular: No evidence of basilar occlusion on angiographic imaging. Embolic shower without LVO would be another consideration, MRI brain with small punctate strokes, likely embolic due to holding his eliquis for LP. Eliquis resumed. ?- DDx infectious: No meningismus to suggest meningitis. Septic encephalopathy is also possible. CSF cell count with differential (Tube 3): Clear, 1 RBC, 2 WBC. CSF Glucose 108, Protein 39 ?- Other DDx: Another consideration would be prolonged hypotension from afib/RVR that spontaneously converted. This is felt to be the least likely etiology for his presentation. ? 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 No seizures noted over the past 24 hrs.  ?- LP not particularly concerning for meningitis with no pleocytosis, no elevated protein. Discontinue Acyclovir given negative HSV PCR.  ? ?Recommendations: ?- Continue LTM EEG ?- Wean propofol from 40 mcg/kg by 5 mcg/kg each hour ?- Keppra 1500 mg BID ?- Depakote 500 mg q8hrs ?- Discontinue Acyclovir, HSV PCR negative. ? ? ?Corky Sox, MD ?PGY-1 ? ? ?NEUROHOSPITALIST ADDENDUM ?Performed a face to face diagnostic evaluation.  ? ?I have reviewed the contents of history and physical exam as documented by PA/ARNP/Resident and agree with above documentation.  ?I have discussed and formulated the above plan as documented. Edits to the note have been made as needed. ? ?Impression/Key exam findings/Plan: Spoke with daughter Bethena Roys. No seizures on propofol for over 24 hours. We will gradually wean him off propofol with hope that he does not have seizure recurrence. Will keep him on cEEG overnight. We will see how much his mentation  improves over the next few days. Family understands that he is going to have a prolonged recovery stretched over months and will have prolonged delirium. He has been delirious and slow since December and this worsened after his CABG. They want to give him a few days and see if he starts waking up after taking him off propofol. I think this is reasonable at this time. ? ? ?This patient is critically ill and at significant risk of neurological worsening, death and care requires constant monitoring of vital signs, hemodynamics,respiratory and cardiac monitoring, neurological assessment, discussion with family, other specialists and medical decision making of high complexity. I spent 40 minutes of neurocritical care time  in the care of  this patient. This was time spent independent of any time provided by nurse practitioner or PA. ? ?Donnetta Simpers ?[facility] ?Pager  Number [phone] ?11/21/2021  11:55 AM ? ? ?Donnetta Simpers, MD ?[facility] ?[phone] ?  ?If 7pm to 7am, please call on call as listed on AMION. ? ? ? ?

## 2021-11-22 ENCOUNTER — Inpatient Hospital Stay (HOSPITAL_COMMUNITY): Payer: Medicare HMO

## 2021-11-22 DIAGNOSIS — J9601 Acute respiratory failure with hypoxia: Secondary | ICD-10-CM | POA: Diagnosis not present

## 2021-11-22 DIAGNOSIS — G40901 Epilepsy, unspecified, not intractable, with status epilepticus: Secondary | ICD-10-CM | POA: Diagnosis not present

## 2021-11-22 DIAGNOSIS — N179 Acute kidney failure, unspecified: Secondary | ICD-10-CM | POA: Diagnosis not present

## 2021-11-22 DIAGNOSIS — R402431 Glasgow coma scale score 3-8, in the field [EMT or ambulance]: Secondary | ICD-10-CM | POA: Diagnosis not present

## 2021-11-22 DIAGNOSIS — G9340 Encephalopathy, unspecified: Secondary | ICD-10-CM | POA: Diagnosis not present

## 2021-11-22 DIAGNOSIS — R569 Unspecified convulsions: Secondary | ICD-10-CM | POA: Diagnosis not present

## 2021-11-22 LAB — CSF CULTURE W GRAM STAIN: Culture: NO GROWTH

## 2021-11-22 LAB — COMPREHENSIVE METABOLIC PANEL
ALT: 11 U/L (ref 0–44)
AST: 15 U/L (ref 15–41)
Albumin: 1.9 g/dL — ABNORMAL LOW (ref 3.5–5.0)
Alkaline Phosphatase: 90 U/L (ref 38–126)
Anion gap: 7 (ref 5–15)
BUN: 16 mg/dL (ref 8–23)
CO2: 22 mmol/L (ref 22–32)
Calcium: 8 mg/dL — ABNORMAL LOW (ref 8.9–10.3)
Chloride: 107 mmol/L (ref 98–111)
Creatinine, Ser: 0.89 mg/dL (ref 0.61–1.24)
GFR, Estimated: >60 mL/min (ref >60)
Glucose, Bld: 245 mg/dL — ABNORMAL HIGH (ref 70–99)
Potassium: 4.7 mmol/L (ref 3.5–5.1)
Sodium: 136 mmol/L (ref 135–145)
Total Bilirubin: 0.2 mg/dL — ABNORMAL LOW (ref 0.3–1.2)
Total Protein: 5.4 g/dL — ABNORMAL LOW (ref 6.5–8.1)

## 2021-11-22 LAB — CBC
HCT: 31.1 % — ABNORMAL LOW (ref 39.0–52.0)
Hemoglobin: 9.9 g/dL — ABNORMAL LOW (ref 13.0–17.0)
MCH: 25.8 pg — ABNORMAL LOW (ref 26.0–34.0)
MCHC: 31.8 g/dL (ref 30.0–36.0)
MCV: 81.2 fL (ref 80.0–100.0)
Platelets: 109 10*3/uL — ABNORMAL LOW (ref 150–400)
RBC: 3.83 MIL/uL — ABNORMAL LOW (ref 4.22–5.81)
RDW: 21.7 % — ABNORMAL HIGH (ref 11.5–15.5)
WBC: 8.2 10*3/uL (ref 4.0–10.5)
nRBC: 0.4 % — ABNORMAL HIGH (ref 0.0–0.2)

## 2021-11-22 LAB — GLUCOSE, CAPILLARY
Glucose-Capillary: 248 mg/dL — ABNORMAL HIGH (ref 70–99)
Glucose-Capillary: 177 mg/dL — ABNORMAL HIGH (ref 70–99)

## 2021-11-22 MED ORDER — FUROSEMIDE 10 MG/ML IJ SOLN
40.0000 mg | Freq: Once | INTRAMUSCULAR | Status: AC
  Administered 2021-11-22: 40 mg via INTRAVENOUS
  Filled 2021-11-22: qty 4

## 2021-11-22 MED ORDER — MORPHINE SULFATE (PF) 2 MG/ML IV SOLN: 2.0000 mg | INTRAVENOUS | Status: DC | PRN

## 2021-11-22 MED ORDER — ACETAMINOPHEN 650 MG RE SUPP: 650.0000 mg | Freq: Four times a day (QID) | RECTAL | Status: DC | PRN

## 2021-11-22 MED ORDER — ACETAMINOPHEN 325 MG PO TABS
650.0000 mg | ORAL_TABLET | Freq: Four times a day (QID) | ORAL | Status: DC | PRN
Start: 2021-11-22 — End: 2021-11-23

## 2021-11-22 MED ORDER — LORAZEPAM 2 MG/ML IJ SOLN
2.0000 mg | INTRAMUSCULAR | Status: DC | PRN
  Administered 2021-11-22 (×2): 2 mg via INTRAVENOUS
  Filled 2021-11-22 (×2): qty 1

## 2021-11-22 MED ORDER — DEXTROSE 5 % IV SOLN: INTRAVENOUS | Status: DC

## 2021-11-22 MED ORDER — POLYVINYL ALCOHOL 1.4 % OP SOLN
1.0000 [drp] | Freq: Four times a day (QID) | OPHTHALMIC | Status: DC | PRN
  Filled 2021-11-22: qty 15

## 2021-11-22 MED ORDER — GLYCOPYRROLATE 0.2 MG/ML IJ SOLN
0.2000 mg | INTRAMUSCULAR | Status: DC | PRN
  Administered 2021-11-22 – 2021-11-23 (×4): 0.2 mg via INTRAVENOUS
  Filled 2021-11-22 (×4): qty 1

## 2021-11-22 MED ORDER — GLYCOPYRROLATE 0.2 MG/ML IJ SOLN: 0.2000 mg | INTRAMUSCULAR | Status: DC | PRN

## 2021-11-22 MED ORDER — GLYCOPYRROLATE 1 MG PO TABS
1.0000 mg | ORAL_TABLET | ORAL | Status: DC | PRN
  Filled 2021-11-22: qty 1

## 2021-11-22 MED ORDER — DIPHENHYDRAMINE HCL 50 MG/ML IJ SOLN: 25.0000 mg | INTRAMUSCULAR | Status: DC | PRN

## 2021-11-22 MED ORDER — MORPHINE 100MG IN NS 100ML (1MG/ML) PREMIX INFUSION
0.0000 mg/h | INTRAVENOUS | Status: DC
  Administered 2021-11-22: 5 mg/h via INTRAVENOUS
  Administered 2021-11-22 – 2021-11-23 (×2): 9 mg/h via INTRAVENOUS
  Filled 2021-11-22 (×3): qty 100

## 2021-11-22 MED ORDER — MORPHINE BOLUS VIA INFUSION
5.0000 mg | INTRAVENOUS | Status: DC | PRN
  Administered 2021-11-23: 5 mg via INTRAVENOUS
  Filled 2021-11-22: qty 5

## 2021-11-22 NOTE — Progress Notes (Signed)
EEG read noted, return of seizures off propofol.  Patients family updated on results of EEG per Dr. Derry Lory. Family elects to transition to full comfort measures and remove ETT.  Support offered.   ? ? ? ? ?Harry Brim, MSN, APRN, NP-C, AGACNP-BC ?[facility] ?11/22/2021, 11:39 AM ? ? ?Please see [email or web address] for pager details.  ? ?From 7A-7P if no response, please call [phone] ?After hours, please call ELink [phone] ? ?

## 2021-11-22 NOTE — Procedures (Signed)
Extubation Procedure Note ? ?Patient Details:   ?Name: Harry [name] ?DOB: Mar 15, 1941 ?MRN: [id number] ?  ?Airway Documentation:  ?  ?Vent end date: 11/22/21 Vent end time: 1238  ? ?Evaluation ? O2 sats: currently acceptable ?Complications: No apparent complications ?Patient did tolerate procedure well. ?Bilateral Breath Sounds: Clear, Diminished ?  ?No ?Patient extubated to comfort care ? ?Jolayne Panther ?11/22/2021, 12:39 PM ? ?

## 2021-11-22 NOTE — Progress Notes (Signed)
LTM EEG discontinued - no skin breakdown at Harry [name]. ?Pt switched to comfort care. Leads already removed by RN. Skin mild breakdown multiple electrode sites. ?

## 2021-11-22 NOTE — Procedures (Addendum)
Patient Name: Harry [name]  ?MRN: [id number]  ?Epilepsy Attending: Charlsie Quest  ?Referring Physician/Provider: Rejeana Brock, MD ?Duration: 11/21/2021 1604 to 11/22/2021 1444 ?  ?Patient history: 81yo M with ams, eeg showed status epilepticus. Long term eeg for monitoring of seizure. ?  ?Level of alertness: comatose ?  ?AEDs during EEG study: LEV, VPA ?  ?Technical aspects: This EEG study was done with scalp electrodes positioned according to the 10-20 International system of electrode placement. Electrical activity was acquired at a sampling rate of 500Hz  and reviewed with a high frequency filter of 70Hz  and a low frequency filter of 1Hz . EEG data were recorded continuously and digitally stored.  ?  ?Description: EEG showed continuous generalized 3 to 6 Hz theta-delta slowing admixed with intermittent frontocentral 15 to 16 Hz beta activity.  Gradually after around 2000 on 11/21/2021, EEG showed seizures without clinical signs arising from left frontal region. EEG showed  low amplitude 15 to 16hz  beta activity in left frontal region which gradually evolved in frequency to 3 to 5 Hz theta-delta slowing and involved left and right hemisphere. Average 2-5 seizures/ hour, lasting about 30 seconds to 1 minutes. Hyperventilation and photic stimulation were not performed.    ?   ?ABNORMALITY ?-Seizure without clinical signs, left frontal region ?- Continuous slow, generalized ?  ?IMPRESSION: ?This study showed seizures without clinical signs arising from left frontal region, average 2-5 seizures per hour, lasting about 30 seconds to 1 minute.  Additionally there is suggestive of severe diffuse encephalopathy, nonspecific etiology but likely related to sedation.   ? ?  ?

## 2021-11-22 NOTE — Progress Notes (Addendum)
? ?NAME:  Harry [name], MRN:  [id number], DOB:  [birth year], LOS: 6 ?ADMISSION DATE:  11/02/2021, CONSULTATION DATE:  11/08/2021 ?REFERRING MD:  [facility], CHIEF COMPLAINT:  Altered mental status  ? ?History of Present Illness:  ?81 y/o male brought to [facility] ER after being found by his wife unresponsive with his head lying off the bed.  He was making gurgling noises.  CBG 80, given D10, no improvement.  Intubated in ER for airway protection.  Neuro consulted.  CT head negative.  EEG showed seizure.  Unable to do LP initial since on DOAC.  Started on keppra, ABX, and acyclovir.  Transferred to [facility] for LTM. ? ?Pertinent  Medical History  ?Atrial Fib (on DOAC), CAD, AS, s/p 3V CABG x3 & AVR (2023), COPD, HTN, HL, B12 def, BPH, DDD, Diabetes (on insulin), diabetic retinopathy, Obesity, OSA, chronic stroke ? ?Significant Hospital Events: ?Including procedures, antibiotic start and stop dates in addition to other pertinent events   ?2/25 admitted after being found unresponsive by wife. No nuchal rigidity Intubated for airway protection. Initial CT of head/ CT angiogram: both w/ no acute findings. EEG showed active seizure. Lactic acid 7.2. wbc 14.4,. Cultures sent. LP not able to be done due to home DOAC. Transferred to [facility] for LTM w/ status epilepticus concern for possible CNS infection: stated on: acyclovir, cefepime and vanc .  ?2/27 Still having intermittent seizure activity on EEG; no significant improvement in mentation; IR LP largely unremarkable; MRI delayed until 2/28 ?2/28 No change in mentation overnight. Family meeting with change to DNR. CSF largely unremarkable. ?3/1 LTM EEG, on propofol, no seizures.  CSF culture pending, NGTD ?3/2 No seizure on LTM EEG.  On propofol.  Insulin adjusted. ? ?Interim History / Subjective:  ?Daughter at bedside ?Tmax 99.3  ?Off propofol since ~ 4pm on 3/2, no obvious evidence of seizure overnight  ?LTM ongoing  ?PSV wean 8/5, 30% ?I/O 2.3L UOP, -234 ml in last 24 hours  ?Glucose  range 177-248 ? ?Objective   ?Blood pressure 139/83, pulse 89, temperature 99.3 ?F (37.4 ?C), temperature source Oral, resp. rate 19, height 5\' 7"  (1.702 m), weight 97.1 kg, SpO2 100 %. ?   ?Vent Mode: PRVC ?FiO2 (%):  [30 %] 30 % ?Set Rate:  [16 bmp] 16 bmp ?Vt Set:  [540 mL] 540 mL ?PEEP:  [5 cmH20] 5 cmH20 ?Plateau Pressure:  [14 cmH20-19 cmH20] 14 cmH20  ? ?Intake/Output Summary (Last 24 hours) at 11/22/2021 0730 ?Last data filed at 11/22/2021 0600 ?Gross per 24 hour  ?Intake 2074.74 ml  ?Output 2390 ml  ?Net -315.26 ml  ? ?Filed Weights  ? 11/17/21 0429 11/21/21 0500 11/22/21 0500  ?Weight: 84 kg 97.9 kg 97.1 kg  ? ? ?Examination: ?General: elderly adult male lying in bed in NAD on vent   ?HEENT: MM pink/moist, ETT, pupils 56mm reactive, anicteric  ?Neuro: off sedation, no response to sternal rub, nail bed pressure or trap squeeze, noted spontaneous movement of left foot, yawning, no spontaneous eye opening, does not follow commands  ?CV: s1s2 RRR, paced rhythm, no m/r/g ?PULM: non-labored at rest on PSV, lungs bilaterally clear  ?GI: soft, bsx4 active  ?Extremities: warm/dry, 1+ generalized edema  ?Skin: no rashes or lesions ? ?Assessment & Plan:  ? 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  S/p LP with no clear evidence for infection.  Sedation previously interrupted but seizure returned. HSV negative. Propofol off ~4pm  on 3/2.   ?-monitor off sedation  ?-appreciate neurology assistance with patient care  ?-continue keppra, valproate.  Defer mgmt of AED's to neurology  ?-monitor for further seizure activity ? ?Acute Respiratory Failure in setting of Status Epilepticus (POA) ?COPD w/o exacerbation  ?-PRVC 8cc/kg  ?-ok for PSV wean but mental status precludes extubation at this time  ?-follow intermittent CXR  ?-duoneb PRN  ? ?Hypotension - resolved  ?Sedation related. Off pressors.  ? ?Type 2 DM with Hyperglycemia  ?-resistant SSI   ?-glargine 20 units QHS  ? ?Moderate Protein Calorie Malnutrition ?-TF per Nutrition  ? ?HFrEF (25-30%) ?Hx CAD s/p 3V CABG with AVR 09/24/21, AF on anticoagulation, HTN, HLD  ?-lasix 40 mg IV x1  ?-tele monitoring  ?-continue eliquis, pravastatin for now  ? ?Anemia ?Thrombocytopenia  ?-monitor CBC  ?-transfuse for Hgb <7% ?-no indication for transfusion at this time, monitor for bleeding  ? ?[place] Discussion  ?Comfort Measures ?-see below discussion  ?-DNR as of 2/28 ?-see separate note  ? ?Best Practice (right click and "Reselect all SmartList Selections" daily)  ?Diet/type: tubefeeds ?DVT prophylaxis: SCD, eliquis  ?GI prophylaxis: PPI ?Lines: N/A ?Foley:  Yes, and it is still needed ?Code Status:  full code ?Last date of multidisciplinary goals of care discussion: Multiple discussions with daughter, family.  Bethena Roys updated in detail 3/3 am.  Pending further goals of care with other siblings this am.  ? ?Critical Care Time: 36 minutes  ? ?Noe Gens, MSN, APRN, NP-C, AGACNP-BC ?[facility] ?11/22/2021, 7:30 AM ? ? ?Please see [email or web address] for pager details.  ? ?From 7A-7P if no response, please call [phone] ?After hours, please call Warren Lacy [phone] ? ? ? ?

## 2021-11-22 NOTE — Progress Notes (Signed)
Neurology Progress Note ? ?Brief HPI: 81 y.o. male who was found by his wife on the morning of admission with his top half hanging off of the bed with his eyes open but would not fixate or track his wife and was with incomprehensible sounds. Patient's GCS remained poor on arrival to [facility] and he was intubated for airway protection. 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 He was loaded with 4 g of Keppra and started on propofol. Patient was transferred to [facility] for continuous EEG monitoring and further work up and management.  ? ?Subjective: ?EEG with recurrence of seizures this morning. Per critical care nurse, family has elected for full comfort care.  ? ? ?Objective: ?Vitals:  ? 11/22/21 0900 11/22/21 1000  ?BP: (!) 145/88 124/72  ?Pulse: 92 87  ?Resp: (!) 24 (!) 21  ?Temp:    ?SpO2: 95% 96%  ? ?Gen: Intubated in ICU, sedated with propofol ?Resp: respirations assisted via mechanical ventilator ?Abd: soft, non-distended ? ?Neuro:  ?Mental Status: patient is intubated and sedated in the ICU.  ?He responds to painful stimulus but does not follow commands ?Cranial Nerves: pupils barely reactive, oculocephalic and corneal reflexes improved, able to gag and cough today ?Motor and Sensory:  ?Unable to test motor and sensation given mental status ?2+ patellar reflexes, absent elsewhere ?Plantars: upgoing plantar reflexes bilaterally ?Gait: NA ? ?Pertinent Labs: ?CBC ?   ?Component Value Date/Time  ? WBC 8.2 11/22/2021 0406  ? RBC 3.83 (L) 11/22/2021 0406  ? HGB 9.9 (L) 11/22/2021 0406  ? HGB 15.1 01/29/2012 0030  ? HCT 31.1 (L) 11/22/2021 0406  ? HCT 46.0 01/29/2012 0030  ? PLT 109 (L) 11/22/2021 0406  ? PLT 201 01/29/2012 0030  ? MCV 81.2 11/22/2021 0406  ? MCV 87 01/29/2012 0030  ? MCH 25.8 (L) 11/22/2021 0406  ? MCHC 31.8 11/22/2021 0406  ? RDW 21.7 (H) 11/22/2021 0406  ? RDW 14.5 01/29/2012 0030  ? LYMPHSABS 2.7 11/20/2021 0754   ? MONOABS 0.8 11/20/2021 0754  ? EOSABS 0.8 (H) 11/20/2021 0754  ? BASOSABS 0.1 11/20/2021 0754  ? ?CMP  ?   ?Component Value Date/Time  ? NA 136 11/22/2021 0406  ? NA 139 01/29/2012 0030  ? K 4.7 11/22/2021 0406  ? K 4.7 01/29/2012 0030  ? CL 107 11/22/2021 0406  ? CL 104 01/29/2012 0030  ? CO2 22 11/22/2021 0406  ? CO2 25 01/29/2012 0030  ? GLUCOSE 245 (H) 11/22/2021 0406  ? GLUCOSE 209 (H) 01/29/2012 0030  ? BUN 16 11/22/2021 0406  ? BUN 13 01/29/2012 0030  ? CREATININE 0.89 11/22/2021 0406  ? CREATININE 0.81 01/29/2012 0030  ? CALCIUM 8.0 (L) 11/22/2021 0406  ? CALCIUM 9.2 01/29/2012 0030  ? PROT 5.4 (L) 11/22/2021 0406  ? ALBUMIN 1.9 (L) 11/22/2021 0406  ? AST 15 11/22/2021 0406  ? ALT 11 11/22/2021 0406  ? ALKPHOS 90 11/22/2021 0406  ? BILITOT 0.2 (L) 11/22/2021 0406  ? GFRNONAA >60 11/22/2021 0406  ? GFRNONAA >60 01/29/2012 0030  ? GFRAA >60 09/03/2016 1625  ? GFRAA >60 01/29/2012 0030  ? ?Lactic Acid, Venous ?   ?Component Value Date/Time  ? LATICACIDVEN 1.5 11/17/2021 0158  ? ? Latest Reference Range & Units 11/05/2021 11:57  ?Influenza A By PCR NEGATIVE  NEGATIVE  ?Influenza B By PCR NEGATIVE  NEGATIVE  ?SARS Coronavirus 2 by RT PCR NEGATIVE  NEGATIVE  ? ?  [place] 2/25: 10 ? ?Urinalysis ?   ?Component Value Date/Time  ? COLORURINE YELLOW (A) 11/15/2021 1006  ? APPEARANCEUR HAZY (A) 11/15/2021 1006  ? APPEARANCEUR Clear 06/12/2021 0856  ? LABSPEC 1.020 11/15/2021 1006  ? LABSPEC 1.010 01/29/2012 1600  ? PHURINE 5.0 10/26/2021 1006  ? [place] NEGATIVE 10/28/2021 1006  ? GLUCOSEU 50 mg/dL 01/29/2012 1600  ? [place] NEGATIVE 11/19/2021 1006  ? [place] NEGATIVE 11/15/2021 1006  ? BILIRUBINUR Negative 06/12/2021 0856  ? BILIRUBINUR Negative 01/29/2012 1600  ? [place] NEGATIVE 10/31/2021 1006  ? PROTEINUR 30 (A) 11/13/2021 1006  ? NITRITE NEGATIVE 10/26/2021 1006  ? LEUKOCYTESUR NEGATIVE 10/23/2021 1006  ? LEUKOCYTESUR Negative 01/29/2012 1600  ? ?Lab Results  ?Component Value Date  ? TSH 1.110 11/09/2021   ? ?Imaging Reviewed: ? ?[place] 2/25: ?No acute intracranial abnormality. ?Stable cerebral atrophy and chronic small vessel disease. ? ?CTA head and neck 2/25: ?No significant change since prior study. No large vessel occlusion, hemodynamically significant stenosis, or evidence of dissection. ? ?Overnight EEG 2/26-2/27 ?"This study showed generalized periodic discharges with triphasic morphology with fluctuating frequency of 1 to 2.5 Hz, seen predominantly when patient was stimulated.  This EEG pattern is suggestive of epileptogenicity with generalized onset and is on the ictal-interictal continuum with intermediate potential for seizure recurrence." ? ?MRI-Brain 2/28 ?Small acute/subacute infarcts in the corona radiata, remote lacunar infarcts in caudate heads. ? ?cEEG 11/21/2021 ?EEG showed continuous generalized 3 to 6 Hz theta-delta slowing admixed with intermittent frontocentral 15 to 16 Hz beta activity. ?Hyperventilation and photic stimulation were not performed. ? ?Assessment: 81 yo male with acute onset coma. EEG revealed partial status epilepticus. Transferred from [facility] for LTM EEG.  ?- DDx epileptic: Partial status epilepticus is the most likely etiology for his presentation.  ?- DDx vascular: No evidence of basilar occlusion on angiographic imaging. Embolic shower without LVO would be another consideration, MRI brain with small punctate strokes, likely embolic due to holding his eliquis for LP. Eliquis resumed. ?- DDx infectious: No meningismus to suggest meningitis. Septic encephalopathy is also possible. CSF cell count with differential (Tube 3): Clear, 1 RBC, 2 WBC. CSF Glucose 108, Protein 39 ?- Other DDx: Another consideration would be prolonged hypotension from afib/RVR that spontaneously converted. This is felt to be the least likely etiology for his presentation. ? 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 Recurrence of seizures noted on EEG.  ?- LP not particularly concerning for meningitis with no pleocytosis, no elevated protein. Discontinue Acyclovir given negative HSV PCR.  ? ?Recommendations: ?Family has elected for full comfort measures. We will sign off at this time. Please re-consult should further questions arise.  ? ?Corky Sox, MD ?PGY-1 ? ?NEUROHOSPITALIST ADDENDUM ?Performed a face to face diagnostic evaluation.  ? ?I have reviewed the contents of history and physical exam as documented by PA/ARNP/Resident and agree with above documentation.  ?I have discussed and formulated the above plan as documented. Edits to the note have been made as needed. ? ?Impression/Key exam findings/Plan: He had recurrence of seizures after propofol was weaned off. I spoke with family about it. His overall prognosis and quality of life is going to be poor given multiple recurrent seizures. Family rightfully opted for comfort care. We will signoff. ? ?Donnetta Simpers, MD ?[facility] ?[id number] ?  ?If 7pm to 7am, please call on call as listed on AMION. ? ?

## 2021-11-23 DIAGNOSIS — Z515 Encounter for palliative care: Secondary | ICD-10-CM | POA: Diagnosis not present

## 2021-11-23 DIAGNOSIS — R402431 Glasgow coma scale score 3-8, in the field [EMT or ambulance]: Secondary | ICD-10-CM | POA: Diagnosis not present

## 2021-11-23 DIAGNOSIS — G9340 Encephalopathy, unspecified: Secondary | ICD-10-CM | POA: Diagnosis not present

## 2021-12-12 ENCOUNTER — Ambulatory Visit: Payer: Medicare HMO | Admitting: Urology

## 2021-12-21 NOTE — Progress Notes (Signed)
82 mL of Morphine wasted with Debroah Loop, RN.  ?

## 2021-12-21 NOTE — Progress Notes (Signed)
? ?NAME:  Harry [name], MRN:  [id number], DOB:  11-Nov-1940, LOS: 7 ?ADMISSION DATE:  11/12/2021, CONSULTATION DATE:  10/28/2021 ?REFERRING MD:  [facility], CHIEF COMPLAINT:  Altered mental status  ? ?History of Present Illness:  ?81 y/o male brought to [facility] ER after being found by his wife unresponsive with his head lying off the bed.  He was making gurgling noises.  CBG 80, given D10, no improvement.  Intubated in ER for airway protection.  Neuro consulted.  CT head negative.  EEG showed seizure.  Unable to do LP initial since on DOAC.  Started on keppra, ABX, and acyclovir.  Transferred to [facility] for LTM. ? ?Pertinent  Medical History  ?Atrial Fib (on DOAC), CAD, AS, s/p 3V CABG x3 & AVR (2023), COPD, HTN, HL, B12 def, BPH, DDD, Diabetes (on insulin), diabetic retinopathy, Obesity, OSA, chronic stroke ? ?Significant Hospital Events: ?Including procedures, antibiotic start and stop dates in addition to other pertinent events   ?2/25 admitted after being found unresponsive by wife. No nuchal rigidity Intubated for airway protection. Initial CT of head/ CT angiogram: both w/ no acute findings. EEG showed active seizure. Lactic acid 7.2. wbc 14.4,. Cultures sent. LP not able to be done due to home DOAC. Transferred to [facility] for LTM w/ status epilepticus concern for possible CNS infection: stated on: acyclovir, cefepime and vanc .  ?2/27 Still having intermittent seizure activity on EEG; no significant improvement in mentation; IR LP largely unremarkable; MRI delayed until 2/28 ?2/28 No change in mentation overnight. Family meeting with change to DNR. CSF largely unremarkable. ?3/1 LTM EEG, on propofol, no seizures.  CSF culture pending, NGTD ?3/2 No seizure on LTM EEG.  On propofol.  Insulin adjusted. ?3/3 compassionately extubated and transitioned to comfort care ?3/4 VS declining -- family at bedside, on morphine gtt  ? ?Interim History / Subjective:  ?Family is at bedside ?Appreciative of CCM care the past several  days ? ?Pt on morphing gtt  @9  ml/hr ? ?Snoring respirations with accessory muscle use  ?RN shares concerns regarding VS on last check and recheck, which revealed SpO2 40% and SBPs in the 60s  ? ?Objective   ?Blood pressure (!) 60/48, pulse 64, temperature 99.9 ?F (37.7 ?C), temperature source Axillary, resp. rate (!) 9, height 5\' 7"  (1.702 m), weight 97.1 kg, SpO2 (!) 40 %. ?   ?Vent Mode: PSV;CPAP ?FiO2 (%):  [30 %] 30 % ?PEEP:  [5 cmH20] 5 cmH20 ?Pressure Support:  [8 cmH20] 8 cmH20  ? ?Intake/Output Summary (Last 24 hours) at 2021-11-29 0923 ?Last data filed at 11/22/2021 2200 ?Gross per 24 hour  ?Intake 675.14 ml  ?Output 900 ml  ?Net -224.86 ml  ? ?Filed Weights  ? 11/17/21 0429 11/21/21 0500 11/22/21 0500  ?Weight: 84 kg 97.9 kg 97.1 kg  ? ? ?Examination: ?General: Elderly M reclined in bed, appearing to be at EOL  ?HEENT: NCAT. Pink tacky mm  ?Neuro: sedated  ?CV: rr ?PULM: snoring respirations with trapezius muscle recruitment  ?GI: soft nondistended  ?Extremities: generalized edema   ?Skin: pale, clean, dry  ? ?Assessment & Plan:  ? ?Encounter for palliative Care / Goals of care  ?DNR status  ?3/4 AM --pt VS have decompensated with SpO2 40s and SBPs 60s.  ?P ?-Compassionately transitioned to end of life, comfort focussed care 3/3  ?-cont morphine gtt + PRN morphine, PRN ativan, PRN glyco  ?-d/w RN available PRN morphine bolus & titratable morphine gtt for resp sx  ?-cont emotional  support for family.  ?-will continue VPA and Keppra for SE  ? ?Other problems: ?SE  ?Acute encephalopathy ?Acute to subacute infarcts  of corona radiata ?Remote lacunar infarcts  ?Acute Respiratory Failure in setting of Status Epilepticus  ?COPD w/o exacerbation  ?Hypotension  ?Type 2 DM with Hyperglycemia  ?Moderate Protein Calorie Malnutrition ?HFrEF (25-30%) ?Hx CAD s/p 3V CABG with AVR 09/24/21, AF on anticoagulation, HTN, HLD  ?Anemia ?Thrombocytopenia  ? ? ?Best Practice (right click and "Reselect all SmartList Selections" daily)   ?Diet/type: NPO ?DVT prophylaxis: not indicated,  ?GI prophylaxis: N/A and PPI ?Lines: N/A ?Foley:  Yes, and it is still needed ?Code Status:  DNR ?Last date of multidisciplinary goals of care discussion: 3/4 -- daughters updated at bedside  ? ? ?Eliseo Gum MSN, AGACNP-BC ?[facility] ?Amion for pager  ?12-06-2021, 9:23 AM ? ? ? ? ?

## 2021-12-21 NOTE — Death Summary Note (Signed)
DEATH SUMMARY   Patient Details  Name: Harry [name] MRN: [id number] DOB: 1941-01-20  Admission/Discharge Information   Admit Date:  2021/12/06  Date of Death: Date of Death: 12/13/2021  Time of Death: Time of Death: 02/16/13  Length of Stay: 7  Referring Physician: Kandyce Rud, MD   Reason(s) for Hospitalization  Seizure  Diagnoses  Preliminary cause of death:  Status epilepticus Secondary Diagnoses (including complications and co-morbidities):  Principal Problem:   Comatose (HCC) Active Problems:   BPH (benign prostatic hyperplasia)   Coronary artery disease involving native coronary artery of native heart without angina pectoris   Insulin dependent type 2 diabetes mellitus, controlled (HCC)   Hypertensive retinopathy of both eyes   Paroxysmal atrial fibrillation (HCC)   Chronic anticoagulation   History of stroke   History of aortic valve repair   Hx of CABG   AKI (acute kidney injury) (HCC)   Lactic acidosis   Status epilepticus (HCC)   Leukocytosis   Acute encephalopathy   Seizures (HCC)   Acute respiratory failure with hypoxia (HCC) DNR Comfort measures  Brief Hospital Course (including significant findings, care, treatment, and services provided and events leading to death)  Harry [name] is a 81 y.o. year old male who had recently undergone a CABG and AVR at [facility] presented to [facility] after he was found unresponsive with his head hanging off the bed. He was intubated for airway protection.  He was found to have seizure activity so he was moved to [facility] for further evaluation.  There he was monitored for several days and treated with multiple anti-epileptics while on mechanical ventilation.  His seizure activity would stop when he was on propofol.  Unfortunately when we weaned off propofol he would have recurrence of seizure activity.  After several days of making no neurologic progress we addressed code status with his family.  Given the fact that he had multiple  underlying illnesses, had a poor functional status prior to his cardiac surgery, and a prolonged recovery afterwards they stated that he would not be willing to undergo a lengthy period of critical illness.  Given this they elected to change to full comfort measures.      Pertinent Labs and Studies  Significant Diagnostic Studies CT ANGIO HEAD NECK W WO CM  Result Date: Dec 06, 2021 CLINICAL DATA:  Stroke, follow up; unresponsive EXAM: CT ANGIOGRAPHY HEAD AND NECK TECHNIQUE: Multidetector CT imaging of the head and neck was performed using the standard protocol during bolus administration of intravenous contrast. Multiplanar CT image reconstructions and MIPs were obtained to evaluate the vascular anatomy. Carotid stenosis measurements (when applicable) are obtained utilizing NASCET criteria, using the distal internal carotid diameter as the denominator. RADIATION DOSE REDUCTION: This exam was performed according to the departmental dose-optimization program which includes automated exposure control, adjustment of the mA and/or kV according to patient size and/or use of iterative reconstruction technique. CONTRAST:  OMNIPAQUE IOHEXOL 350 MG/ML SOLN COMPARISON:  04/01/2021 FINDINGS: CTA NECK Suboptimal contrast bolus timing. Aortic arch: Plaque along the arch. Great vessel origins are patent. Direct origin of the left vertebral in the arch. Right carotid system: Patent. Calcified plaque at the bifurcation and proximal internal carotid with less than 50% stenosis. Marked stenosis at the ECA origin. Left carotid system: Patent. Calcified plaque at the bifurcation and proximal internal carotid. No ICA origin stenosis. Marked stenosis at the ECA origin. Vertebral arteries: Patent.  Codominant.  No stenosis. Skeleton: Cervical spine degenerative changes. Bulky anterior osteophytes displacing the  posterior pharyngeal wall and esophagus. Other neck: Unremarkable. Upper chest: Dictated separately. Review of the  MIP images confirms the above findings CTA HEAD Anterior circulation: Patent intracranial internal carotid arteries. Calcified plaque along the cavernous and proximal supraclinoid portions. Stenosis again ranges from mild to moderate and is greater on the right. Anterior and middle cerebral arteries are patent. Relatively diminished caliber of proximal right M2 branches probably reflects atherosclerotic changes and was also present previously and likely exacerbated by contrast bolus timing. Posterior circulation: Intracranial vertebral arteries patent. Basilar artery is patent. Posterior cerebral arteries are patent. Distal right P2 PCA stenosis better seen on prior study. Venous sinuses: Patent as allowed by contrast bolus timing. Review of the MIP images confirms the above findings IMPRESSION: No significant change since prior study. No large vessel occlusion, hemodynamically significant stenosis, or evidence of dissection. Electronically Signed   By: Guadlupe SpanishPraneil  Patel M.D.   On: 10/29/2021 11:53   CT HEAD WO CONTRAST (5MM)  Result Date: 11/15/2021 CLINICAL DATA:  Altered mental status. EXAM: CT HEAD WITHOUT CONTRAST TECHNIQUE: Contiguous axial images were obtained from the base of the skull through the vertex without intravenous contrast. RADIATION DOSE REDUCTION: This exam was performed according to the departmental dose-optimization program which includes automated exposure control, adjustment of the mA and/or kV according to patient size and/or use of iterative reconstruction technique. COMPARISON:  04/02/2011 FINDINGS: Brain: No evidence of intracranial hemorrhage, acute infarction, hydrocephalus, extra-axial collection, or mass lesion/mass effect. Mild diffuse cerebral atrophy is unchanged. 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 Vascular:  No hyperdense vessel or other acute findings. Skull: No evidence of fracture or other significant bone abnormality. Sinuses/Orbits:   No acute findings. Other: None. IMPRESSION: No acute intracranial abnormality. 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 Electronically Signed   By: Danae OrleansJohn A Stahl M.D.   On: 11/15/2021 10:33   MR BRAIN W WO CONTRAST  Result Date: 11/19/2021 CLINICAL DATA:  Seizure, new onset EXAM: MRI HEAD WITHOUT AND WITH CONTRAST TECHNIQUE: Multiplanar, multiecho pulse sequences of the brain and surrounding structures were obtained without and with intravenous contrast. CONTRAST:  8mL GADAVIST GADOBUTROL 1 MMOL/ML IV SOLN COMPARISON:  CT head 11/11/2021, MR head 04/03/2021 FINDINGS: Brain: There are small foci of diffusion restriction in the bilateral corona radiata/centrum semiovale with faint corresponding low ADC signal consistent with small acute to early subacute infarcts. There is no associated hemorrhage or mass effect. There is no other evidence of acute infarct. There is no evidence of acute intracranial hemorrhage or extra-axial fluid collection. There is a background of moderate global parenchymal volume loss with prominence of the ventricular system and extra-axial CSF spaces. Confluent FLAIR signal abnormality throughout the subcortical and periventricular white matter likely reflects sequela of moderate chronic white matter microangiopathy. There are scattered punctate chronic microhemorrhages, nonspecific. There are small remote lacunar infarcts in the bilateral caudate heads. There is mild bilateral hippocampal atrophy. There is no hippocampal signal abnormality. There is no abnormal enhancement. There is no mass lesion. There is no mass effect or midline shift. Vascular: Normal flow voids. Skull and upper cervical spine: Normal marrow signal. Sinuses/Orbits: The paranasal sinuses are clear. Bilateral lens implants are in place. The globes and orbits are otherwise unremarkable. Other: There is fluid in the imaged airway, likely related to instrumentation IMPRESSION: 1. Small acute to early  subacute infarcts in the bilateral corona radiata/centrum semiovale. No other evidence of acute intracranial pathology. 2. Moderate global parenchymal volume loss and  chronic white matter microangiopathy, and small remote lacunar infarcts in the bilateral caudate heads. Electronically Signed   By: Lesia Hausen M.D.   On: 11/19/2021 14:57   DG CHEST PORT 1 VIEW  Result Date: 11/22/2021 CLINICAL DATA:  81 year old male with respiratory failure, hypoxia. EXAM: PORTABLE CHEST 1 VIEW COMPARISON:  Portable chest 11/17/2021 and earlier. FINDINGS: Portable AP semi upright view at 0532 hours. Endotracheal tube tip remains in good position just below the clavicles. Enteric tube courses to the abdomen, tip not included. Prior TAVR. Cardiac event recorder or lead less ICD is stable. Stable cardiac size and mediastinal contours. Larger lung volumes. No pulmonary edema. No pneumothorax. Right lung now appears clear. Ongoing confluent left lung base opacity, with mildly complicated appearing left pleural effusion and lower lobe atelectasis or consolidation on the most recent CT 10/28/2021. No acute osseous abnormality identified. IMPRESSION: 1. Stable lines and tubes. 2. Improved ventilation at the right lung base since 11/17/2021, but continued left lung base hypo ventilation where combined pleural effusion and collapse or consolidation were demonstrated on the most recent CT. Electronically Signed   By: Odessa Fleming M.D.   On: 11/22/2021 08:52   DG Chest Port 1 View  Result Date: 11/17/2021 CLINICAL DATA:  Respirator dependent Evaluate atelectasis. pneumonia Pulmonary edema. CABG x3 10/22/2021 EXAM: PORTABLE CHEST 1 VIEW COMPARISON:  11/13/2021 and older studies. FINDINGS: Endotracheal tube and nasal/orogastric tube are stable. There is opacity at the left lung base that silhouettes the hemidiaphragm, consistent with atelectasis with a probable small associated pleural effusion. Remainder of the lungs is clear. No mediastinal  widening.  No pneumothorax. IMPRESSION: 1. No significant change from the previous day's study allowing for differences in patient positioning and lung volumes. 2. No evidence of an operative complication. 3. Persistent left lung base opacity consistent with a combination of a small effusion and atelectasis. 4. Stable well-positioned support apparatus. Electronically Signed   By: Amie Portland M.D.   On: 11/17/2021 08:34   DG Chest Port 1 View  Result Date: 11/10/2021 CLINICAL DATA:  Possible sepsis. EXAM: PORTABLE CHEST 1 VIEW COMPARISON:  09/14/2021. FINDINGS: Since the previous chest radiograph, cardiac surgery has been performed with valve replacement. Cardiac silhouette is normal in size. No mediastinal widening. Mild opacity at the left lung base, consistent with atelectasis. Remainder of the lungs is clear. Possible small left effusion. No evidence of a pneumothorax. Endotracheal tube has its tip projecting 4.6 cm above the carina. Nasal/orogastric tube passes below the diaphragm, well into the stomach and below the included field of view. IMPRESSION: 1. No acute findings. Mild left lung base opacity is consistent with atelectasis. No convincing pneumonia and no evidence of pulmonary edema. 2. Endotracheal tube and nasal/orogastric tube are well positioned. 3. Interval changes from cardiac surgery without evidence of an operative complication. Electronically Signed   By: Amie Portland M.D.   On: 11/02/2021 10:22   EEG adult  Result Date: 11/10/2021 Rejeana Brock, MD     10/29/2021  3:14 PM History: 81 yo M with coma of unclear eitology. Sedation: None Technique: This EEG was acquired with electrodes placed according to the International 10-20 electrode system (including Fp1, Fp2, F3, F4, C3, C4, P3, P4, O1, O2, T3, T4, T5, T6, A1, A2, Fz, Cz, Pz). The following electrodes were missing or displaced: none. Background: The background consists of irregular delta and theta activity.  There are  frequent periods of evolution starting with high beta low gamma range activity in the  left frontal region which evolves in frequency, field and morphology.  The seizures occur frequently lasting from 30 seconds to a minute.  Followed by 1 to 2 minutes of quiescence. Photic stimulation: Physiologic driving is not performed EEG Abnormalities: 1) frequent partial seizures arising in the left frontal region, amounting to status epilepticus Clinical Interpretation: This EEG recorded frequent seizures in the left frontal region, essentially amounting to partial status epilepticus. Ritta Slot, MD [facility] [phone] If 7pm- 7am, please page neurology on call as listed in AMION.   Overnight EEG with video  Result Date: 11/17/2021 Charlsie Quest, MD     11/18/2021  8:31 AM Patient Name: Harry [name] MRN: [id number] Epilepsy Attending: Charlsie Quest Referring Physician/Provider: Rejeana Brock, MD Duration: 11/03/2021 1604 to 11/17/2021 1604 Patient history: 81yo M with ams, eeg showed status epilepticus. Long term eeg for monitoring of seizuerees Level of alertness: comatose AEDs during EEG study: LEV, propofol Technical aspects: This EEG study was done with scalp electrodes positioned according to the 10-20 International system of electrode placement. Electrical activity was acquired at a sampling rate of 500Hz  and reviewed with a high frequency filter of 70Hz  and a low frequency filter of 1Hz . EEG data were recorded continuously and digitally stored. Description: EEG showed near continuous generalized polymorphic 3 to 6 Hz theta-delta slowing admixed with 15-18hz  generalized beta activity. There are also brief 2-5 seconds of generalized eeg attenuation.  Hyperventilation and photic stimulation were not performed.   ABNORMALITY - Continuous slow, generalized - Background attenuation, generalized IMPRESSION: This study is suggestive of severe  to profound diffuse encephalopathy,  nonspecific etiology but could be related to sedation. No seizures or definite epileptiform discharges were seen throughout the recording.   ECHOCARDIOGRAM COMPLETE  Result Date: 11/17/2021    ECHOCARDIOGRAM REPORT   Patient Name:   TELLIS SPIVAK Date of Exam: 11/17/2021 Medical Rec #:  11/19/2021         Height:       67.0 in Accession #:    [id number] Groves        Weight:       185.2 lb Date of Birth:  [birth year]          BSA:          1.957 m Patient Age:    81 years          BP:           100/65 mmHg Patient Gender: M                 HR:           74 bpm. Exam Location:  Inpatient Procedure: 2D Echo, Cardiac Doppler, Color Doppler and Intracardiac            Opacification Agent Indications:    CHF  History:        Patient has prior history of Echocardiogram examinations, most                 recent 09/15/2021. CAD, Pacemaker and Prior CABG,                 Arrythmias:Atrial Fibrillation, Signs/Symptoms:Murmur; Risk                 Factors:Diabetes and Hypertension. 09/17/21 cath                 10/22/21 CABGx4 and TAVR  02/20/21 pacemaker.  Sonographer:    Neomia DearAMARA CROWN RDCS Referring Phys: [id number] VINEET SOOD  Sonographer Comments: Technically difficult study due to poor echo windows and echo performed with patient supine and on artificial respirator. IMPRESSIONS  1. Left ventricular ejection fraction, by estimation, is 25 to 30%. The left ventricle has severely decreased function. The left ventricle demonstrates global hypokinesis. Left ventricular diastolic function could not be evaluated. There is akinesis of the left ventricular, apical segment.  2. Right ventricular systolic function is normal. The right ventricular size is normal. There is mildly elevated pulmonary artery systolic pressure. The estimated right ventricular systolic pressure is 36.3 mmHg.  3. The mitral valve is normal in structure. Trivial mitral valve regurgitation. No evidence of mitral stenosis. Severe mitral  annular calcification.  4. The aortic valve has been repaired/replaced. Aortic valve regurgitation is not visualized. No aortic stenosis is present. Aortic valve area, by VTI measures 2.08 cm. Aortic valve mean gradient measures 4.7 mmHg. Aortic valve Vmax measures 1.57 m/s.  5. The inferior vena cava is normal in size with greater than 50% respiratory variability, suggesting right atrial pressure of 3 mmHg. FINDINGS  Left Ventricle: Left ventricular ejection fraction, by estimation, is 25 to 30%. The left ventricle has severely decreased function. The left ventricle demonstrates global hypokinesis. Definity contrast agent was given IV to delineate the left ventricular endocardial borders. The left ventricular internal cavity size was normal in size. There is no left ventricular hypertrophy. Abnormal (paradoxical) septal motion, consistent with RV pacemaker. Left ventricular diastolic function could not be evaluated due to atrial fibrillation. Left ventricular diastolic function could not be evaluated. Right Ventricle: The right ventricular size is normal. No increase in right ventricular wall thickness. Right ventricular systolic function is normal. There is mildly elevated pulmonary artery systolic pressure. The tricuspid regurgitant velocity is 2.31  m/s, and with an assumed right atrial pressure of 15 mmHg, the estimated right ventricular systolic pressure is 36.3 mmHg. Left Atrium: Left atrial size was normal in size. Right Atrium: Right atrial size was normal in size. Pericardium: There is no evidence of pericardial effusion. Mitral Valve: The mitral valve is normal in structure. Severe mitral annular calcification. Trivial mitral valve regurgitation. No evidence of mitral valve stenosis. MV peak gradient, 7.2 mmHg. The mean mitral valve gradient is 3.0 mmHg. Tricuspid Valve: The tricuspid valve is normal in structure. Tricuspid valve regurgitation is trivial. No evidence of tricuspid stenosis. Aortic Valve:  The aortic valve has been repaired/replaced. Aortic valve regurgitation is not visualized. No aortic stenosis is present. Aortic valve mean gradient measures 4.7 mmHg. Aortic valve peak gradient measures 9.9 mmHg. Aortic valve area, by VTI measures 2.08 cm. There is a bioprosthetic valve present in the aortic position. Pulmonic Valve: The pulmonic valve was normal in structure. Pulmonic valve regurgitation is trivial. No evidence of pulmonic stenosis. Aorta: The aortic root is normal in size and structure. Venous: The inferior vena cava is normal in size with greater than 50% respiratory variability, suggesting right atrial pressure of 3 mmHg. IAS/Shunts: No atrial level shunt detected by color flow Doppler. Additional Comments: A device lead is visualized.  LEFT VENTRICLE PLAX 2D LVIDd:         4.65 cm LVIDs:         3.80 cm LV PW:         1.10 cm LV IVS:        1.00 cm LVOT diam:     2.50 cm LV SV:  47 LV SV Index:   24 LVOT Area:     4.91 cm  RIGHT VENTRICLE RV Basal diam:  3.30 cm RV Mid diam:    2.10 cm RV S prime:     6.37 cm/s TAPSE (M-mode): 0.6 cm LEFT ATRIUM             Index        RIGHT ATRIUM           Index LA diam:        3.30 cm 1.69 cm/m   RA Area:     14.20 cm LA Vol (A2C):   34.6 ml 17.68 ml/m  RA Volume:   30.50 ml  15.58 ml/m LA Vol (A4C):   66.1 ml 33.77 ml/m LA Biplane Vol: 48.6 ml 24.83 ml/m  AORTIC VALVE                     PULMONIC VALVE AV Area (Vmax):    2.12 cm      PV Vmax:          0.79 m/s AV Area (Vmean):   2.23 cm      PV Vmean:         54.700 cm/s AV Area (VTI):     2.08 cm      PV VTI:           0.134 m AV Vmax:           157.25 cm/s   PV Peak grad:     2.5 mmHg AV Vmean:          101.667 cm/s  PV Mean grad:     1.0 mmHg AV VTI:            0.224 m       PR End Diast Vel: 5.20 msec AV Peak Grad:      9.9 mmHg AV Mean Grad:      4.7 mmHg LVOT Vmax:         67.90 cm/s LVOT Vmean:        46.100 cm/s LVOT VTI:          0.095 m LVOT/AV VTI ratio: 0.42  AORTA Ao Root  diam: 3.10 cm Ao Asc diam:  2.70 cm MITRAL VALVE                TRICUSPID VALVE MV Area (PHT): 5.13 cm     TR Peak grad:   21.3 mmHg MV Area VTI:   1.74 cm     TR Vmax:        231.00 cm/s MV Peak grad:  7.2 mmHg MV Mean grad:  3.0 mmHg     SHUNTS MV Vmax:       1.34 m/s     Systemic VTI:  0.09 m MV Vmean:      73.4 cm/s    Systemic Diam: 2.50 cm MV Decel Time: 148 msec MV E velocity: 124.00 cm/s MV A velocity: 65.00 cm/s MV E/A ratio:  1.91 Armanda Magic MD Electronically signed by Armanda Magic MD Signature Date/Time: 11/17/2021/3:43:48 PM    Final    CT Angio Chest/Abd/Pel for Dissection W and/or Wo Contrast  Result Date: 2021/12/07 CLINICAL DATA:  Unresponsive patient. Clinical suspicion for acute aortic syndrome. EXAM: CT ANGIOGRAPHY CHEST, ABDOMEN AND PELVIS TECHNIQUE: Non-contrast CT of the chest was initially obtained. Multidetector CT imaging through the chest, abdomen and pelvis was performed using the standard protocol during bolus administration of intravenous contrast.  Multiplanar reconstructed images and MIPs were obtained and reviewed to evaluate the vascular anatomy. RADIATION DOSE REDUCTION: This exam was performed according to the departmental dose-optimization program which includes automated exposure control, adjustment of the mA and/or kV according to patient size and/or use of iterative reconstruction technique. CONTRAST:  OMNIPAQUE IOHEXOL 350 MG/ML SOLN COMPARISON:  Current and prior chest radiographs. FINDINGS: CTA CHEST FINDINGS Cardiovascular: Changes from recent cardiac surgery and aortic valve replacement. Heart is normal in size and overall configuration. Coronary artery calcifications and stents. Prosthetic aortic valve appears well positioned. Thoracic aorta is normal in caliber. Mild atherosclerosis. No dissection. No intramural hematoma or penetrating ulcer. Mediastinum/Nodes: Postoperative edema noted along the anterior mediastinum. No pericardial effusion. No neck base,  mediastinal or hilar masses or enlarged lymph nodes. Trachea unremarkable. Endotracheal 2 lies 3.5 cm above the carina. Esophagus unremarkable. Nasal/orogastric tube tip lies in the mid stomach. Lungs/Pleura: Trace pleural effusions, larger on the left. Pleural fluid on the left tracks along the oblique fissure. Low-attenuation material noted in the left lower lobe bronchus consistent with mucous plugging/secretions. There is partial left lower lobe atelectasis with depression of the oblique fissure posteriorly. Mild subsegmental atelectasis is noted in the dependent right lower lobe adjacent to the trace pleural effusion. No convincing pneumonia. No evidence of pulmonary edema. No pneumothorax. Musculoskeletal: Recent median sternotomy. No soft tissue fluid collection to suggest an abscess. There is also a transverse fracture or defect along the mid sternal body there is presumably postoperative as well. No other fractures or defects. No bone lesions. Review of the MIP images confirms the above findings. CTA ABDOMEN AND PELVIS FINDINGS VASCULAR Aorta: Aorta normal in caliber. Atherosclerotic plaque with no significant stenosis. No dissection. No mural hematoma or penetrating ulcer. Celiac: Calcified atherosclerotic plaque at the origin of the celiac axis. Estimated narrowing at least 70%. SMA: Atherosclerotic plaque at the origin of the superior mesenteric artery, vessel narrowed to 50%. Renals: Both renal arteries are patent without evidence of aneurysm, dissection, vasculitis, fibromuscular dysplasia or significant stenosis. IMA: Patent. Inflow: Atherosclerotic plaque along the common and, to lesser degree, right external iliac arteries. No significant stenosis. No aneurysm or dissection. Veins: No obvious venous abnormality within the limitations of this arterial phase study. Review of the MIP images confirms the above findings. NON-VASCULAR Hepatobiliary: Unremarkable liver. Small gallstone. No gallbladder wall  thickening or inflammation. No bile duct dilation. Pancreas: Unremarkable. No pancreatic ductal dilatation or surrounding inflammatory changes. Spleen: Normal in size without focal abnormality. Adrenals/Urinary Tract: No adrenal masses. Kidneys normal in size, orientation and position. Chronic appearing bilateral perinephric stranding/edema. No renal masses or stones. No hydronephrosis. Normal ureters. Bladder partly decompressed with a Foley catheter. Stomach/Bowel: Stomach unremarkable. Small bowel and colon are normal in caliber. No wall thickening. No inflammation. No evidence of appendicitis. Lymphatic: No enlarged lymph nodes. Reproductive: Unremarkable. Other: No abdominal wall hernia or abnormality. No abdominopelvic ascites. Musculoskeletal: No fracture or acute finding.  No bone lesion. Review of the MIP images confirms the above findings. IMPRESSION: CTA FINDINGS 1. No thoracoabdominal aortic aneurysm or dissection. No evidence of acute aortic syndrome. 2. 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 NON CTA FINDINGS 1. Trace pleural effusions. Left lower lobe partial atelectasis, which appears due to secretions/mucus plugging in the left lower lobe bronchus. 2. Recent cardiac surgery and aortic valve replacement. No evidence an operative complication. 3. No acute findings within the abdomen or pelvis. Electronically Signed   By: Amie Portland  M.D.   On: 11/24/2021 12:03   DG FL GUIDED LUMBAR PUNCTURE  Result Date: 11/19/2021 CLINICAL DATA:  81 year old male with encephalopathy. Request made for lumbar puncture. EXAM: DIAGNOSTIC LUMBAR PUNCTURE UNDER FLUOROSCOPIC GUIDANCE COMPARISON:  None FLUOROSCOPY: Fluoroscopy Time: 5.9 mGy PROCEDURE: Informed consent was obtained prior to the procedure, including potential complications of headache, allergy, and pain. With the patient prone, the lower back was prepped with Betadine. 1% Lidocaine was used for local  anesthesia. Lumbar puncture was performed at the L2-L3 level using a 20 gauge needle with return of clear CSF. 12 ml of CSF were obtained for laboratory studies. The patient tolerated the procedure well and there were no apparent complications. IMPRESSION: Technically successful lumbar puncture at L2-L3. Read by: Loyce Dys PA-C Electronically Signed   By: Corlis Leak M.D.   On: 11/19/2021 07:34    Microbiology Recent Results (from the past 240 hour(s))  Blood Culture (routine x 2)     Status: None   Collection Time: 11-24-21 10:06 AM   Specimen: BLOOD  Result Value Ref Range Status   Specimen Description BLOOD BLOOD RIGHT WRIST  Final   Special Requests   Final    BOTTLES DRAWN AEROBIC AND ANAEROBIC Blood Culture results may not be optimal due to an inadequate volume of blood received in culture bottles   Culture   Final    NO GROWTH 5 DAYS Performed at [facility], [address] [place], [place] [address]    Report Status 11/21/2021 FINAL  Final  Blood Culture (routine x 2)     Status: None   Collection Time: 2021-11-24 10:06 AM   Specimen: BLOOD  Result Value Ref Range Status   Specimen Description BLOOD BLOOD LEFT FOREARM  Final   Special Requests   Final    BOTTLES DRAWN AEROBIC AND ANAEROBIC Blood Culture results may not be optimal due to an inadequate volume of blood received in culture bottles   Culture   Final    NO GROWTH 5 DAYS Performed at [facility], [address] [place], [place] [address]    Report Status 11/21/2021 FINAL  Final  Resp Panel by RT-PCR (Flu A&B, Covid) Nasopharyngeal Swab     Status: None   Collection Time: Nov 24, 2021 11:57 AM   Specimen: Nasopharyngeal Swab; Nasopharyngeal(NP) swabs in vial transport medium  Result Value Ref Range Status   SARS Coronavirus 2 by RT PCR NEGATIVE NEGATIVE Final    Comment: (NOTE) SARS-CoV-2 target nucleic acids are NOT DETECTED.  The SARS-CoV-2 RNA is generally detectable in upper  respiratory specimens during the acute phase of infection. The lowest concentration of SARS-CoV-2 viral copies this assay can detect is 138 copies/mL. A negative result does not preclude SARS-Cov-2 infection and should not be used as the sole basis for treatment or other patient management decisions. A negative result may occur with  improper specimen collection/handling, submission of specimen other than nasopharyngeal swab, presence of viral mutation(s) within the areas targeted by this assay, and inadequate number of viral copies(<138 copies/mL). A negative result must be combined with clinical observations, patient history, and epidemiological information. The expected result is Negative.  Fact Sheet for Patients:  BloggerCourse.com  Fact Sheet for Healthcare Providers:  SeriousBroker.it  This test is no t yet approved or cleared by the [place] FDA and  has been authorized for detection and/or diagnosis of SARS-CoV-2 by FDA under an Emergency Use Authorization (EUA). This EUA will remain  in effect (meaning  this test can be used) for the duration of the COVID-19 declaration under Section 564(b)(1) of the Act, 21 U.S.C.section 360bbb-3(b)(1), unless the authorization is terminated  or revoked sooner.       Influenza A by PCR NEGATIVE NEGATIVE Final   Influenza B by PCR NEGATIVE NEGATIVE Final    Comment: (NOTE) The Xpert Xpress SARS-CoV-2/FLU/RSV plus assay is intended as an aid in the diagnosis of influenza from Nasopharyngeal swab specimens and should not be used as a sole basis for treatment. Nasal washings and aspirates are unacceptable for Xpert Xpress SARS-CoV-2/FLU/RSV testing.  Fact Sheet for Patients: BloggerCourse.com  Fact Sheet for Healthcare Providers: SeriousBroker.it  This test is not yet approved or cleared by the [place] FDA and has been  authorized for detection and/or diagnosis of SARS-CoV-2 by FDA under an Emergency Use Authorization (EUA). This EUA will remain in effect (meaning this test can be used) for the duration of the COVID-19 declaration under Section 564(b)(1) of the Act, 21 U.S.C. section 360bbb-3(b)(1), unless the authorization is terminated or revoked.  Performed at [facility], [address] [place], [place] [address]   MRSA Next Gen by PCR, Nasal     Status: None   Collection Time: 10/25/2021  4:07 PM   Specimen: Nasal Mucosa; Nasal Swab  Result Value Ref Range Status   MRSA by PCR Next Gen NOT DETECTED NOT DETECTED Final    Comment: (NOTE) The GeneXpert MRSA Assay (FDA approved for NASAL specimens only), is one component of a comprehensive MRSA colonization surveillance program. It is not intended to diagnose MRSA infection nor to guide or monitor treatment for MRSA infections. Test performance is not FDA approved in patients less than 51 years old. Performed at [facility], [address], [place], [place] [address]   Culture, blood (routine x 2)     Status: None   Collection Time: 11/07/2021  4:15 PM   Specimen: BLOOD RIGHT HAND  Result Value Ref Range Status   Specimen Description BLOOD RIGHT HAND  Final   Special Requests   Final    BOTTLES DRAWN AEROBIC ONLY Blood Culture results may not be optimal due to an inadequate volume of blood received in culture bottles   Culture   Final    NO GROWTH 5 DAYS Performed at [facility], [address] [place], [place] [address]    Report Status 11/21/2021 FINAL  Final  Culture, blood (routine x 2)     Status: None   Collection Time: 11/08/2021  4:45 PM   Specimen: BLOOD RIGHT WRIST  Result Value Ref Range Status   Specimen Description BLOOD RIGHT WRIST  Final   Special Requests   Final    BOTTLES DRAWN AEROBIC ONLY Blood Culture results may not be optimal due to an inadequate volume of blood received in culture bottles    Culture   Final    NO GROWTH 5 DAYS Performed at [facility], [address], [place], [place] [address]    Report Status 11/21/2021 FINAL  Final  CSF culture w Gram Stain     Status: None   Collection Time: 11/18/21  5:40 PM   Specimen: CSF; Cerebrospinal Fluid  Result Value Ref Range Status   Specimen Description CSF  Final   Special Requests NONE  Final   Gram Stain   Final    WBC SEEN WBC PRESENT,BOTH PMN AND MONONUCLEAR NO ORGANISMS SEEN CYTOSPIN SMEAR    Culture  Final    NO GROWTH 3 DAYS Performed at [facility], [address] [place], [place] [address]    Report Status 11/22/2021 FINAL  Final    Lab Basic Metabolic Panel: Recent Labs  Lab 11/18/21 1630 11/19/21 0242 11/19/21 2001 11/20/21 0754 11/21/21 0513 11/22/21 0406  NA 138 137  --  136 135 136  K 3.7 3.7  --  4.4 4.7 4.7  CL 109 110  --  110 107 107  CO2 19* 19*  --  18* 21* 22  GLUCOSE 226* 200*  --  232* 254* 245*  BUN 14 14  --  13 15 16   CREATININE 0.97 1.00  --  0.85 1.00 0.89  CALCIUM 7.6* 7.6*  --  7.6* 7.8* 8.0*  MG 1.3* 1.3* 2.2 2.1  --   --   PHOS 2.3* 2.2*  --  2.9  --   --    Liver Function Tests: Recent Labs  Lab 11/18/21 1630 11/19/21 0242 11/20/21 0754 11/22/21 0406  AST 19 18 16 15   ALT 14 14 12 11   ALKPHOS 82 86 85 90  BILITOT 0.5 0.2* 0.2* 0.2*  PROT 5.5* 5.5* 5.5* 5.4*  ALBUMIN 2.2* 2.1* 2.0* 1.9*   No results for input(s): LIPASE, AMYLASE in the last 168 hours. No results for input(s): AMMONIA in the last 168 hours. CBC: Recent Labs  Lab 11/18/21 1630 11/19/21 0242 11/20/21 0754 11/21/21 0513 11/22/21 0406  WBC 8.9 8.4 9.0 10.7* 8.2  NEUTROABS  --  4.1 4.7  --   --   HGB 9.9* 10.0* 9.6* 9.9* 9.9*  HCT 32.5* 31.4* 30.8* 31.0* 31.1*  MCV 80.6 79.7* 81.1 80.9 81.2  PLT 121* 123* 121* 129* 109*   Cardiac Enzymes: No results for input(s): CKTOTAL, CKMB, CKMBINDEX, TROPONINI in the last 168 hours. Sepsis Labs: Recent Labs  Lab 11/19/21 0242  11/20/21 0754 11/21/21 0513 11/22/21 0406  WBC 8.4 9.0 10.7* 8.2    Procedures/Operations  ETT   [facility] 11/24/2021, 2:03 PM

## 2021-12-21 DEATH — deceased
